# Patient Record
Sex: Female | Born: 1945 | Race: White | Hispanic: No | Marital: Married | Smoking: Former smoker
Health system: Southern US, Community
[De-identification: ages and names within clinical notes are randomized; demographics above are authoritative.]

## PROBLEM LIST (undated history)

## (undated) DIAGNOSIS — Z8719 Personal history of other diseases of the digestive system: Secondary | ICD-10-CM

## (undated) DIAGNOSIS — H35369 Drusen (degenerative) of macula, unspecified eye: Secondary | ICD-10-CM

## (undated) DIAGNOSIS — N183 Chronic kidney disease, stage 3 (moderate): Secondary | ICD-10-CM

## (undated) DIAGNOSIS — K8020 Calculus of gallbladder without cholecystitis without obstruction: Secondary | ICD-10-CM

## (undated) DIAGNOSIS — G459 Transient cerebral ischemic attack, unspecified: Secondary | ICD-10-CM

## (undated) DIAGNOSIS — S93324A Dislocation of tarsometatarsal joint of right foot, initial encounter: Secondary | ICD-10-CM

## (undated) DIAGNOSIS — H10409 Unspecified chronic conjunctivitis, unspecified eye: Secondary | ICD-10-CM

## (undated) DIAGNOSIS — Z794 Long term (current) use of insulin: Secondary | ICD-10-CM

## (undated) DIAGNOSIS — F32A Depression, unspecified: Secondary | ICD-10-CM

## (undated) DIAGNOSIS — M25572 Pain in left ankle and joints of left foot: Secondary | ICD-10-CM

## (undated) DIAGNOSIS — E119 Type 2 diabetes mellitus without complications: Secondary | ICD-10-CM

## (undated) DIAGNOSIS — E785 Hyperlipidemia, unspecified: Secondary | ICD-10-CM

## (undated) DIAGNOSIS — F329 Major depressive disorder, single episode, unspecified: Secondary | ICD-10-CM

## (undated) DIAGNOSIS — H2512 Age-related nuclear cataract, left eye: Secondary | ICD-10-CM

## (undated) DIAGNOSIS — E890 Postprocedural hypothyroidism: Secondary | ICD-10-CM

## (undated) DIAGNOSIS — K635 Polyp of colon: Secondary | ICD-10-CM

## (undated) DIAGNOSIS — R4189 Other symptoms and signs involving cognitive functions and awareness: Secondary | ICD-10-CM

## (undated) DIAGNOSIS — I639 Cerebral infarction, unspecified: Secondary | ICD-10-CM

## (undated) DIAGNOSIS — I2510 Atherosclerotic heart disease of native coronary artery without angina pectoris: Secondary | ICD-10-CM

## (undated) DIAGNOSIS — A0472 Enterocolitis due to Clostridium difficile, not specified as recurrent: Secondary | ICD-10-CM

## (undated) DIAGNOSIS — D509 Iron deficiency anemia, unspecified: Secondary | ICD-10-CM

## (undated) DIAGNOSIS — M5030 Other cervical disc degeneration, unspecified cervical region: Secondary | ICD-10-CM

## (undated) DIAGNOSIS — R0602 Shortness of breath: Secondary | ICD-10-CM

## (undated) DIAGNOSIS — E039 Hypothyroidism, unspecified: Secondary | ICD-10-CM

## (undated) DIAGNOSIS — H53462 Homonymous bilateral field defects, left side: Secondary | ICD-10-CM

## (undated) DIAGNOSIS — N3090 Cystitis, unspecified without hematuria: Secondary | ICD-10-CM

## (undated) DIAGNOSIS — G4733 Obstructive sleep apnea (adult) (pediatric): Secondary | ICD-10-CM

## (undated) DIAGNOSIS — I10 Essential (primary) hypertension: Secondary | ICD-10-CM

## (undated) DIAGNOSIS — R0683 Snoring: Secondary | ICD-10-CM

## (undated) DIAGNOSIS — R8271 Bacteriuria: Secondary | ICD-10-CM

## (undated) DIAGNOSIS — H43819 Vitreous degeneration, unspecified eye: Secondary | ICD-10-CM

## (undated) DIAGNOSIS — E1122 Type 2 diabetes mellitus with diabetic chronic kidney disease: Secondary | ICD-10-CM

## (undated) DIAGNOSIS — Z85528 Personal history of other malignant neoplasm of kidney: Secondary | ICD-10-CM

## (undated) DIAGNOSIS — E079 Disorder of thyroid, unspecified: Secondary | ICD-10-CM

## (undated) DIAGNOSIS — C649 Malignant neoplasm of unspecified kidney, except renal pelvis: Secondary | ICD-10-CM

## (undated) DIAGNOSIS — N189 Chronic kidney disease, unspecified: Secondary | ICD-10-CM

## (undated) DIAGNOSIS — Z9289 Personal history of other medical treatment: Secondary | ICD-10-CM

## (undated) DIAGNOSIS — I4891 Unspecified atrial fibrillation: Secondary | ICD-10-CM

## (undated) DIAGNOSIS — R413 Other amnesia: Secondary | ICD-10-CM

## (undated) DIAGNOSIS — R8281 Pyuria: Secondary | ICD-10-CM

## (undated) DIAGNOSIS — K5521 Angiodysplasia of colon with hemorrhage: Secondary | ICD-10-CM

## (undated) DIAGNOSIS — C642 Malignant neoplasm of left kidney, except renal pelvis: Secondary | ICD-10-CM

## (undated) DIAGNOSIS — M19079 Primary osteoarthritis, unspecified ankle and foot: Secondary | ICD-10-CM

## (undated) DIAGNOSIS — J449 Chronic obstructive pulmonary disease, unspecified: Secondary | ICD-10-CM

## (undated) DIAGNOSIS — K859 Acute pancreatitis without necrosis or infection, unspecified: Secondary | ICD-10-CM

## (undated) DIAGNOSIS — D649 Anemia, unspecified: Secondary | ICD-10-CM

## (undated) HISTORY — DX: Shortness of breath: R06.02

## (undated) HISTORY — DX: Drusen (degenerative) of macula, unspecified eye: H35.369

## (undated) HISTORY — DX: Unspecified atrial fibrillation: I48.91

## (undated) HISTORY — DX: Dislocation of tarsometatarsal joint of right foot, initial encounter: S93.324A

## (undated) HISTORY — DX: Snoring: R06.83

## (undated) HISTORY — DX: Malignant neoplasm of unspecified kidney, except renal pelvis: C64.9

## (undated) HISTORY — DX: Malignant neoplasm of left kidney, except renal pelvis: C64.2

## (undated) HISTORY — DX: Pain in left ankle and joints of left foot: M25.572

## (undated) HISTORY — DX: Essential (primary) hypertension: I10

## (undated) HISTORY — DX: Type 2 diabetes mellitus with diabetic chronic kidney disease: Z79.4

## (undated) HISTORY — DX: Pyuria: R82.81

## (undated) HISTORY — DX: Personal history of other malignant neoplasm of kidney: Z85.528

## (undated) HISTORY — DX: Personal history of other diseases of the digestive system: Z87.19

## (undated) HISTORY — DX: Hyperlipidemia, unspecified: E78.5

## (undated) HISTORY — DX: Primary osteoarthritis, unspecified ankle and foot: M19.079

## (undated) HISTORY — DX: Chronic kidney disease, unspecified: N18.9

## (undated) HISTORY — DX: Homonymous bilateral field defects, left side: H53.462

## (undated) HISTORY — DX: Transient cerebral ischemic attack, unspecified: G45.9

## (undated) HISTORY — DX: Depression, unspecified: F32.A

## (undated) HISTORY — DX: Calculus of gallbladder without cholecystitis without obstruction: K80.20

## (undated) HISTORY — DX: Anemia, unspecified: D64.9

## (undated) HISTORY — DX: Cystitis, unspecified without hematuria: N30.90

## (undated) HISTORY — DX: Unspecified chronic conjunctivitis, unspecified eye: H10.409

## (undated) HISTORY — DX: Postprocedural hypothyroidism: E89.0

## (undated) HISTORY — DX: Vitreous degeneration, unspecified eye: H43.819

## (undated) HISTORY — DX: Polyp of colon: K63.5

## (undated) HISTORY — DX: Acute pancreatitis without necrosis or infection, unspecified: K85.90

## (undated) HISTORY — DX: Hypothyroidism, unspecified: E03.9

## (undated) HISTORY — DX: Iron deficiency anemia, unspecified: D50.9

## (undated) HISTORY — DX: Personal history of other medical treatment: Z92.89

## (undated) HISTORY — DX: Enterocolitis due to Clostridium difficile, not specified as recurrent: A04.72

## (undated) HISTORY — DX: Chronic kidney disease, stage 3 (moderate): N18.3

## (undated) HISTORY — DX: Cerebral infarction, unspecified: I63.9

## (undated) HISTORY — DX: Disorder of thyroid, unspecified: E07.9

## (undated) HISTORY — DX: Bacteriuria: R82.71

## (undated) HISTORY — DX: Angiodysplasia of colon with hemorrhage: K55.21

## (undated) HISTORY — DX: Other cervical disc degeneration, unspecified cervical region: M50.30

## (undated) HISTORY — DX: Type 2 diabetes mellitus without complications: E11.9

## (undated) HISTORY — DX: Major depressive disorder, single episode, unspecified: F32.9

## (undated) HISTORY — DX: Other amnesia: R41.3

## (undated) HISTORY — DX: Age-related nuclear cataract, left eye: H25.12

## (undated) HISTORY — DX: Obstructive sleep apnea (adult) (pediatric): G47.33

## (undated) HISTORY — DX: Other symptoms and signs involving cognitive functions and awareness: R41.89

## (undated) HISTORY — DX: Atherosclerotic heart disease of native coronary artery without angina pectoris: I25.10

## (undated) HISTORY — PX: CHOLECYSTECTOMY: SHX55

## (undated) HISTORY — PX: CATARACT EXTRACTION: SUR2

## (undated) HISTORY — DX: Long term (current) use of insulin: E11.22

---

## 1976-08-09 HISTORY — PX: THYROIDECTOMY, PARTIAL: SHX18

## 1978-08-09 HISTORY — PX: TUBAL LIGATION: SHX77

## 1982-08-09 HISTORY — PX: TOTAL ABDOMINAL HYSTERECTOMY W/ BILATERAL SALPINGOOPHORECTOMY: SHX83

## 1993-08-09 DIAGNOSIS — I10 Essential (primary) hypertension: Secondary | ICD-10-CM

## 1993-08-09 HISTORY — DX: Essential (primary) hypertension: I10

## 1997-08-09 DIAGNOSIS — E119 Type 2 diabetes mellitus without complications: Secondary | ICD-10-CM

## 1997-08-09 HISTORY — DX: Type 2 diabetes mellitus without complications: E11.9

## 2000-08-09 DIAGNOSIS — I2510 Atherosclerotic heart disease of native coronary artery without angina pectoris: Secondary | ICD-10-CM

## 2000-08-09 HISTORY — DX: Atherosclerotic heart disease of native coronary artery without angina pectoris: I25.10

## 2000-08-09 HISTORY — PX: CORONARY STENT PLACEMENT: SHX1402

## 2003-08-10 DIAGNOSIS — K635 Polyp of colon: Secondary | ICD-10-CM

## 2003-08-10 DIAGNOSIS — C642 Malignant neoplasm of left kidney, except renal pelvis: Secondary | ICD-10-CM

## 2003-08-10 DIAGNOSIS — R0683 Snoring: Secondary | ICD-10-CM

## 2003-08-10 HISTORY — DX: Snoring: R06.83

## 2003-08-10 HISTORY — DX: Polyp of colon: K63.5

## 2003-08-10 HISTORY — PX: NEPHRECTOMY: SHX65

## 2003-08-10 HISTORY — DX: Malignant neoplasm of left kidney, except renal pelvis: C64.2

## 2006-08-09 DIAGNOSIS — K859 Acute pancreatitis without necrosis or infection, unspecified: Secondary | ICD-10-CM

## 2006-08-09 DIAGNOSIS — K8020 Calculus of gallbladder without cholecystitis without obstruction: Secondary | ICD-10-CM

## 2006-08-09 HISTORY — DX: Calculus of gallbladder without cholecystitis without obstruction: K80.20

## 2006-08-09 HISTORY — PX: GALLBLADDER SURGERY: SHX652

## 2006-08-09 HISTORY — DX: Acute pancreatitis without necrosis or infection, unspecified: K85.90

## 2009-08-09 DIAGNOSIS — I639 Cerebral infarction, unspecified: Secondary | ICD-10-CM

## 2009-08-09 HISTORY — DX: Cerebral infarction, unspecified: I63.9

## 2011-07-13 DIAGNOSIS — Z905 Acquired absence of kidney: Secondary | ICD-10-CM | POA: Insufficient documentation

## 2011-07-13 DIAGNOSIS — Z8051 Family history of malignant neoplasm of kidney: Secondary | ICD-10-CM | POA: Insufficient documentation

## 2011-07-20 DIAGNOSIS — I10 Essential (primary) hypertension: Secondary | ICD-10-CM | POA: Insufficient documentation

## 2012-07-04 DIAGNOSIS — H2510 Age-related nuclear cataract, unspecified eye: Secondary | ICD-10-CM | POA: Insufficient documentation

## 2012-07-04 DIAGNOSIS — H10409 Unspecified chronic conjunctivitis, unspecified eye: Secondary | ICD-10-CM

## 2012-07-04 DIAGNOSIS — H35369 Drusen (degenerative) of macula, unspecified eye: Secondary | ICD-10-CM | POA: Insufficient documentation

## 2012-07-04 DIAGNOSIS — H53462 Homonymous bilateral field defects, left side: Secondary | ICD-10-CM

## 2012-07-04 DIAGNOSIS — H43819 Vitreous degeneration, unspecified eye: Secondary | ICD-10-CM | POA: Insufficient documentation

## 2012-07-04 DIAGNOSIS — H269 Unspecified cataract: Secondary | ICD-10-CM | POA: Insufficient documentation

## 2012-07-04 HISTORY — DX: Vitreous degeneration, unspecified eye: H43.819

## 2012-07-04 HISTORY — DX: Homonymous bilateral field defects, left side: H53.462

## 2012-07-04 HISTORY — DX: Unspecified chronic conjunctivitis, unspecified eye: H10.409

## 2012-07-04 HISTORY — DX: Drusen (degenerative) of macula, unspecified eye: H35.369

## 2014-01-20 DIAGNOSIS — Q2112 Patent foramen ovale: Secondary | ICD-10-CM | POA: Insufficient documentation

## 2014-01-20 DIAGNOSIS — Q211 Atrial septal defect: Secondary | ICD-10-CM | POA: Insufficient documentation

## 2014-05-28 DIAGNOSIS — M47812 Spondylosis without myelopathy or radiculopathy, cervical region: Secondary | ICD-10-CM | POA: Insufficient documentation

## 2014-05-28 DIAGNOSIS — K219 Gastro-esophageal reflux disease without esophagitis: Secondary | ICD-10-CM | POA: Insufficient documentation

## 2014-05-28 DIAGNOSIS — M5417 Radiculopathy, lumbosacral region: Secondary | ICD-10-CM

## 2014-05-28 DIAGNOSIS — G4733 Obstructive sleep apnea (adult) (pediatric): Secondary | ICD-10-CM | POA: Insufficient documentation

## 2014-05-28 DIAGNOSIS — M5030 Other cervical disc degeneration, unspecified cervical region: Secondary | ICD-10-CM | POA: Insufficient documentation

## 2014-05-28 DIAGNOSIS — M810 Age-related osteoporosis without current pathological fracture: Secondary | ICD-10-CM | POA: Insufficient documentation

## 2014-05-28 DIAGNOSIS — N3000 Acute cystitis without hematuria: Secondary | ICD-10-CM | POA: Insufficient documentation

## 2014-05-28 DIAGNOSIS — IMO0002 Reserved for concepts with insufficient information to code with codable children: Secondary | ICD-10-CM | POA: Insufficient documentation

## 2014-05-28 DIAGNOSIS — N3010 Interstitial cystitis (chronic) without hematuria: Secondary | ICD-10-CM | POA: Insufficient documentation

## 2014-05-28 DIAGNOSIS — N35919 Unspecified urethral stricture, male, unspecified site: Secondary | ICD-10-CM | POA: Insufficient documentation

## 2014-05-28 DIAGNOSIS — M545 Low back pain, unspecified: Secondary | ICD-10-CM | POA: Insufficient documentation

## 2014-05-28 DIAGNOSIS — S39012A Strain of muscle, fascia and tendon of lower back, initial encounter: Secondary | ICD-10-CM | POA: Insufficient documentation

## 2014-05-28 DIAGNOSIS — R4182 Altered mental status, unspecified: Secondary | ICD-10-CM | POA: Insufficient documentation

## 2014-05-28 DIAGNOSIS — M51379 Other intervertebral disc degeneration, lumbosacral region without mention of lumbar back pain or lower extremity pain: Secondary | ICD-10-CM | POA: Insufficient documentation

## 2014-05-28 DIAGNOSIS — E162 Hypoglycemia, unspecified: Secondary | ICD-10-CM | POA: Insufficient documentation

## 2014-05-28 DIAGNOSIS — R2689 Other abnormalities of gait and mobility: Secondary | ICD-10-CM | POA: Insufficient documentation

## 2014-05-28 DIAGNOSIS — J449 Chronic obstructive pulmonary disease, unspecified: Secondary | ICD-10-CM | POA: Insufficient documentation

## 2014-05-28 DIAGNOSIS — M5137 Other intervertebral disc degeneration, lumbosacral region: Secondary | ICD-10-CM | POA: Insufficient documentation

## 2014-05-28 DIAGNOSIS — R350 Frequency of micturition: Secondary | ICD-10-CM | POA: Insufficient documentation

## 2014-05-28 DIAGNOSIS — F17200 Nicotine dependence, unspecified, uncomplicated: Secondary | ICD-10-CM | POA: Insufficient documentation

## 2014-05-28 DIAGNOSIS — K861 Other chronic pancreatitis: Secondary | ICD-10-CM | POA: Insufficient documentation

## 2014-05-28 DIAGNOSIS — L03012 Cellulitis of left finger: Secondary | ICD-10-CM | POA: Insufficient documentation

## 2014-05-28 DIAGNOSIS — Z87891 Personal history of nicotine dependence: Secondary | ICD-10-CM | POA: Insufficient documentation

## 2014-05-28 DIAGNOSIS — M5414 Radiculopathy, thoracic region: Secondary | ICD-10-CM | POA: Insufficient documentation

## 2014-05-28 DIAGNOSIS — R251 Tremor, unspecified: Secondary | ICD-10-CM | POA: Insufficient documentation

## 2014-05-28 DIAGNOSIS — M79609 Pain in unspecified limb: Secondary | ICD-10-CM | POA: Insufficient documentation

## 2014-05-28 DIAGNOSIS — R609 Edema, unspecified: Secondary | ICD-10-CM | POA: Insufficient documentation

## 2014-05-28 DIAGNOSIS — R110 Nausea: Secondary | ICD-10-CM | POA: Insufficient documentation

## 2014-05-28 DIAGNOSIS — M7989 Other specified soft tissue disorders: Secondary | ICD-10-CM | POA: Insufficient documentation

## 2014-05-28 DIAGNOSIS — R809 Proteinuria, unspecified: Secondary | ICD-10-CM | POA: Insufficient documentation

## 2014-05-28 DIAGNOSIS — R3915 Urgency of urination: Secondary | ICD-10-CM | POA: Insufficient documentation

## 2014-05-28 DIAGNOSIS — F331 Major depressive disorder, recurrent, moderate: Secondary | ICD-10-CM | POA: Insufficient documentation

## 2014-05-28 DIAGNOSIS — I6990 Unspecified sequelae of unspecified cerebrovascular disease: Secondary | ICD-10-CM | POA: Insufficient documentation

## 2014-05-28 DIAGNOSIS — R0989 Other specified symptoms and signs involving the circulatory and respiratory systems: Secondary | ICD-10-CM | POA: Insufficient documentation

## 2015-05-20 DIAGNOSIS — G40209 Localization-related (focal) (partial) symptomatic epilepsy and epileptic syndromes with complex partial seizures, not intractable, without status epilepticus: Secondary | ICD-10-CM | POA: Insufficient documentation

## 2015-08-10 DIAGNOSIS — A0472 Enterocolitis due to Clostridium difficile, not specified as recurrent: Secondary | ICD-10-CM

## 2015-08-10 HISTORY — DX: Enterocolitis due to Clostridium difficile, not specified as recurrent: A04.72

## 2015-08-14 DIAGNOSIS — R8271 Bacteriuria: Secondary | ICD-10-CM

## 2015-08-14 HISTORY — DX: Bacteriuria: R82.71

## 2015-08-19 DIAGNOSIS — Z95818 Presence of other cardiac implants and grafts: Secondary | ICD-10-CM | POA: Insufficient documentation

## 2015-09-30 DIAGNOSIS — B373 Candidiasis of vulva and vagina: Secondary | ICD-10-CM | POA: Insufficient documentation

## 2015-09-30 DIAGNOSIS — L304 Erythema intertrigo: Secondary | ICD-10-CM | POA: Insufficient documentation

## 2015-09-30 DIAGNOSIS — E663 Overweight: Secondary | ICD-10-CM | POA: Insufficient documentation

## 2015-09-30 DIAGNOSIS — B3731 Acute candidiasis of vulva and vagina: Secondary | ICD-10-CM | POA: Insufficient documentation

## 2016-02-24 DIAGNOSIS — F419 Anxiety disorder, unspecified: Secondary | ICD-10-CM

## 2016-02-24 DIAGNOSIS — F329 Major depressive disorder, single episode, unspecified: Secondary | ICD-10-CM | POA: Insufficient documentation

## 2016-04-30 DIAGNOSIS — M19079 Primary osteoarthritis, unspecified ankle and foot: Secondary | ICD-10-CM

## 2016-04-30 DIAGNOSIS — M19072 Primary osteoarthritis, left ankle and foot: Secondary | ICD-10-CM | POA: Insufficient documentation

## 2016-04-30 HISTORY — DX: Primary osteoarthritis, unspecified ankle and foot: M19.079

## 2016-07-08 DIAGNOSIS — Z85528 Personal history of other malignant neoplasm of kidney: Secondary | ICD-10-CM | POA: Insufficient documentation

## 2016-08-09 DIAGNOSIS — K5521 Angiodysplasia of colon with hemorrhage: Secondary | ICD-10-CM

## 2016-08-09 HISTORY — DX: Angiodysplasia of colon with hemorrhage: K55.21

## 2016-08-09 HISTORY — PX: LOOP RECORDER IMPLANT: SHX5954

## 2016-09-09 DIAGNOSIS — I4891 Unspecified atrial fibrillation: Secondary | ICD-10-CM

## 2016-09-09 DIAGNOSIS — R413 Other amnesia: Secondary | ICD-10-CM | POA: Insufficient documentation

## 2016-09-09 HISTORY — DX: Unspecified atrial fibrillation: I48.91

## 2016-09-09 HISTORY — DX: Other amnesia: R41.3

## 2016-09-15 DIAGNOSIS — M7750 Other enthesopathy of unspecified foot: Secondary | ICD-10-CM | POA: Insufficient documentation

## 2016-09-15 DIAGNOSIS — M25572 Pain in left ankle and joints of left foot: Secondary | ICD-10-CM

## 2016-09-15 HISTORY — DX: Pain in left ankle and joints of left foot: M25.572

## 2016-09-19 DIAGNOSIS — N179 Acute kidney failure, unspecified: Secondary | ICD-10-CM | POA: Insufficient documentation

## 2016-09-19 DIAGNOSIS — Z720 Tobacco use: Secondary | ICD-10-CM | POA: Insufficient documentation

## 2016-09-19 DIAGNOSIS — K625 Hemorrhage of anus and rectum: Secondary | ICD-10-CM | POA: Insufficient documentation

## 2016-09-19 DIAGNOSIS — N189 Chronic kidney disease, unspecified: Secondary | ICD-10-CM

## 2016-09-22 HISTORY — PX: COLONOSCOPY: SHX174

## 2016-09-22 HISTORY — PX: UPPER GASTROINTESTINAL ENDOSCOPY: SHX188

## 2016-09-27 DIAGNOSIS — D509 Iron deficiency anemia, unspecified: Secondary | ICD-10-CM

## 2016-09-27 HISTORY — DX: Iron deficiency anemia, unspecified: D50.9

## 2016-10-04 DIAGNOSIS — N179 Acute kidney failure, unspecified: Secondary | ICD-10-CM | POA: Insufficient documentation

## 2016-10-04 DIAGNOSIS — N189 Chronic kidney disease, unspecified: Secondary | ICD-10-CM

## 2016-10-07 DIAGNOSIS — Z9289 Personal history of other medical treatment: Secondary | ICD-10-CM

## 2016-10-07 HISTORY — PX: COLONOSCOPY W/ BIOPSIES: SHX1374

## 2016-10-07 HISTORY — DX: Personal history of other medical treatment: Z92.89

## 2016-10-09 DIAGNOSIS — K5730 Diverticulosis of large intestine without perforation or abscess without bleeding: Secondary | ICD-10-CM | POA: Insufficient documentation

## 2016-10-21 DIAGNOSIS — G459 Transient cerebral ischemic attack, unspecified: Secondary | ICD-10-CM

## 2016-10-21 HISTORY — DX: Transient cerebral ischemic attack, unspecified: G45.9

## 2016-10-22 DIAGNOSIS — D649 Anemia, unspecified: Secondary | ICD-10-CM | POA: Insufficient documentation

## 2016-10-22 DIAGNOSIS — G451 Carotid artery syndrome (hemispheric): Secondary | ICD-10-CM | POA: Insufficient documentation

## 2016-10-22 DIAGNOSIS — E1165 Type 2 diabetes mellitus with hyperglycemia: Secondary | ICD-10-CM | POA: Insufficient documentation

## 2016-11-16 HISTORY — PX: COLONOSCOPY: SHX174

## 2016-12-09 DIAGNOSIS — H2512 Age-related nuclear cataract, left eye: Secondary | ICD-10-CM

## 2016-12-09 HISTORY — DX: Age-related nuclear cataract, left eye: H25.12

## 2017-01-19 DIAGNOSIS — Z9842 Cataract extraction status, left eye: Secondary | ICD-10-CM | POA: Insufficient documentation

## 2017-01-19 HISTORY — PX: CATARACT EXTRACTION W/ INTRAOCULAR LENS IMPLANT: SHX1309

## 2017-01-28 DIAGNOSIS — S93401A Sprain of unspecified ligament of right ankle, initial encounter: Secondary | ICD-10-CM | POA: Insufficient documentation

## 2017-08-14 DIAGNOSIS — S93324A Dislocation of tarsometatarsal joint of right foot, initial encounter: Secondary | ICD-10-CM | POA: Insufficient documentation

## 2017-08-14 HISTORY — DX: Dislocation of tarsometatarsal joint of right foot, initial encounter: S93.324A

## 2017-09-01 ENCOUNTER — Inpatient Hospital Stay (HOSPITAL_COMMUNITY): Payer: Medicare Other

## 2017-09-01 ENCOUNTER — Inpatient Hospital Stay (HOSPITAL_COMMUNITY)
Admission: EM | Admit: 2017-09-01 | Discharge: 2017-09-05 | DRG: 061 | Disposition: A | Discharge status: Skilled Nursing Facility | Payer: Medicare Other | Attending: Neurology | Admitting: Neurology

## 2017-09-01 ENCOUNTER — Encounter (HOSPITAL_COMMUNITY): Payer: Self-pay

## 2017-09-01 ENCOUNTER — Emergency Department (HOSPITAL_COMMUNITY)
Admission: EM | Admit: 2017-09-01 | Discharge: 2017-09-01 | Disposition: A | Discharge status: Home / Self Care | Payer: Medicare Other | Attending: Student in an Organized Health Care Education/Training Program | Admitting: Student in an Organized Health Care Education/Training Program

## 2017-09-01 ENCOUNTER — Emergency Department (HOSPITAL_COMMUNITY): Payer: Medicare Other

## 2017-09-01 DIAGNOSIS — R29717 NIHSS score 17: Secondary | ICD-10-CM | POA: Diagnosis present

## 2017-09-01 DIAGNOSIS — I480 Paroxysmal atrial fibrillation: Secondary | ICD-10-CM

## 2017-09-01 DIAGNOSIS — F17210 Nicotine dependence, cigarettes, uncomplicated: Secondary | ICD-10-CM | POA: Diagnosis present

## 2017-09-01 DIAGNOSIS — Z87898 Personal history of other specified conditions: Secondary | ICD-10-CM

## 2017-09-01 DIAGNOSIS — Z515 Encounter for palliative care: Secondary | ICD-10-CM

## 2017-09-01 DIAGNOSIS — F01518 Vascular dementia, unspecified severity, with other behavioral disturbance: Secondary | ICD-10-CM

## 2017-09-01 DIAGNOSIS — E785 Hyperlipidemia, unspecified: Secondary | ICD-10-CM | POA: Diagnosis present

## 2017-09-01 DIAGNOSIS — J690 Pneumonitis due to inhalation of food and vomit: Secondary | ICD-10-CM | POA: Diagnosis present

## 2017-09-01 DIAGNOSIS — S92321D Displaced fracture of second metatarsal bone, right foot, subsequent encounter for fracture with routine healing: Secondary | ICD-10-CM

## 2017-09-01 DIAGNOSIS — D689 Coagulation defect, unspecified: Secondary | ICD-10-CM | POA: Diagnosis present

## 2017-09-01 DIAGNOSIS — Z8673 Personal history of transient ischemic attack (TIA), and cerebral infarction without residual deficits: Secondary | ICD-10-CM | POA: Diagnosis not present

## 2017-09-01 DIAGNOSIS — Z955 Presence of coronary angioplasty implant and graft: Secondary | ICD-10-CM

## 2017-09-01 DIAGNOSIS — Z7189 Other specified counseling: Secondary | ICD-10-CM | POA: Diagnosis not present

## 2017-09-01 DIAGNOSIS — R29810 Facial weakness: Secondary | ICD-10-CM | POA: Diagnosis present

## 2017-09-01 DIAGNOSIS — J449 Chronic obstructive pulmonary disease, unspecified: Secondary | ICD-10-CM | POA: Diagnosis present

## 2017-09-01 DIAGNOSIS — J9601 Acute respiratory failure with hypoxia: Secondary | ICD-10-CM | POA: Diagnosis present

## 2017-09-01 DIAGNOSIS — G4733 Obstructive sleep apnea (adult) (pediatric): Secondary | ICD-10-CM | POA: Diagnosis not present

## 2017-09-01 DIAGNOSIS — G8191 Hemiplegia, unspecified affecting right dominant side: Secondary | ICD-10-CM | POA: Diagnosis present

## 2017-09-01 DIAGNOSIS — W19XXXA Unspecified fall, initial encounter: Secondary | ICD-10-CM | POA: Diagnosis present

## 2017-09-01 DIAGNOSIS — I639 Cerebral infarction, unspecified: Secondary | ICD-10-CM | POA: Diagnosis present

## 2017-09-01 DIAGNOSIS — I63412 Cerebral infarction due to embolism of left middle cerebral artery: Secondary | ICD-10-CM | POA: Diagnosis present

## 2017-09-01 DIAGNOSIS — R739 Hyperglycemia, unspecified: Secondary | ICD-10-CM | POA: Diagnosis present

## 2017-09-01 DIAGNOSIS — R1310 Dysphagia, unspecified: Secondary | ICD-10-CM | POA: Diagnosis present

## 2017-09-01 DIAGNOSIS — Z79899 Other long term (current) drug therapy: Secondary | ICD-10-CM

## 2017-09-01 DIAGNOSIS — S42001D Fracture of unspecified part of right clavicle, subsequent encounter for fracture with routine healing: Secondary | ICD-10-CM

## 2017-09-01 DIAGNOSIS — F0281 Dementia in other diseases classified elsewhere with behavioral disturbance: Secondary | ICD-10-CM | POA: Diagnosis present

## 2017-09-01 DIAGNOSIS — I129 Hypertensive chronic kidney disease with stage 1 through stage 4 chronic kidney disease, or unspecified chronic kidney disease: Secondary | ICD-10-CM | POA: Diagnosis present

## 2017-09-01 DIAGNOSIS — Z905 Acquired absence of kidney: Secondary | ICD-10-CM

## 2017-09-01 DIAGNOSIS — G9340 Encephalopathy, unspecified: Secondary | ICD-10-CM

## 2017-09-01 DIAGNOSIS — Z8719 Personal history of other diseases of the digestive system: Secondary | ICD-10-CM | POA: Diagnosis not present

## 2017-09-01 DIAGNOSIS — N184 Chronic kidney disease, stage 4 (severe): Secondary | ICD-10-CM | POA: Diagnosis present

## 2017-09-01 DIAGNOSIS — S92331D Displaced fracture of third metatarsal bone, right foot, subsequent encounter for fracture with routine healing: Secondary | ICD-10-CM | POA: Diagnosis not present

## 2017-09-01 DIAGNOSIS — S42001A Fracture of unspecified part of right clavicle, initial encounter for closed fracture: Secondary | ICD-10-CM | POA: Diagnosis present

## 2017-09-01 DIAGNOSIS — R471 Dysarthria and anarthria: Secondary | ICD-10-CM | POA: Diagnosis present

## 2017-09-01 DIAGNOSIS — I2510 Atherosclerotic heart disease of native coronary artery without angina pectoris: Secondary | ICD-10-CM | POA: Diagnosis present

## 2017-09-01 DIAGNOSIS — J9691 Respiratory failure, unspecified with hypoxia: Secondary | ICD-10-CM | POA: Diagnosis not present

## 2017-09-01 DIAGNOSIS — F0151 Vascular dementia with behavioral disturbance: Secondary | ICD-10-CM | POA: Diagnosis present

## 2017-09-01 DIAGNOSIS — Z85528 Personal history of other malignant neoplasm of kidney: Secondary | ICD-10-CM | POA: Diagnosis not present

## 2017-09-01 DIAGNOSIS — S92341D Displaced fracture of fourth metatarsal bone, right foot, subsequent encounter for fracture with routine healing: Secondary | ICD-10-CM

## 2017-09-01 DIAGNOSIS — J9690 Respiratory failure, unspecified, unspecified whether with hypoxia or hypercapnia: Secondary | ICD-10-CM

## 2017-09-01 DIAGNOSIS — D509 Iron deficiency anemia, unspecified: Secondary | ICD-10-CM | POA: Diagnosis present

## 2017-09-01 DIAGNOSIS — Z66 Do not resuscitate: Secondary | ICD-10-CM | POA: Diagnosis present

## 2017-09-01 DIAGNOSIS — Z888 Allergy status to other drugs, medicaments and biological substances status: Secondary | ICD-10-CM

## 2017-09-01 DIAGNOSIS — R296 Repeated falls: Secondary | ICD-10-CM | POA: Diagnosis present

## 2017-09-01 DIAGNOSIS — Z7902 Long term (current) use of antithrombotics/antiplatelets: Secondary | ICD-10-CM

## 2017-09-01 DIAGNOSIS — J432 Centrilobular emphysema: Secondary | ICD-10-CM | POA: Diagnosis not present

## 2017-09-01 DIAGNOSIS — S82899A Other fracture of unspecified lower leg, initial encounter for closed fracture: Secondary | ICD-10-CM

## 2017-09-01 DIAGNOSIS — R4701 Aphasia: Secondary | ICD-10-CM | POA: Diagnosis present

## 2017-09-01 DIAGNOSIS — I6340 Cerebral infarction due to embolism of unspecified cerebral artery: Secondary | ICD-10-CM | POA: Diagnosis not present

## 2017-09-01 DIAGNOSIS — R451 Restlessness and agitation: Secondary | ICD-10-CM | POA: Diagnosis not present

## 2017-09-01 DIAGNOSIS — I63442 Cerebral infarction due to embolism of left cerebellar artery: Secondary | ICD-10-CM | POA: Diagnosis not present

## 2017-09-01 HISTORY — DX: Cerebral infarction, unspecified: I63.9

## 2017-09-01 HISTORY — DX: Chronic obstructive pulmonary disease, unspecified: J44.9

## 2017-09-01 LAB — URINALYSIS, ROUTINE W REFLEX MICROSCOPIC
Bacteria, UA: NONE SEEN
Bilirubin Urine: NEGATIVE
GLUCOSE, UA: NEGATIVE mg/dL
Ketones, ur: NEGATIVE mg/dL
Leukocytes, UA: NEGATIVE
NITRITE: NEGATIVE
PROTEIN: NEGATIVE mg/dL
Specific Gravity, Urine: 1.02 (ref 1.005–1.030)
Squamous Epithelial / LPF: NONE SEEN
pH: 5 (ref 5.0–8.0)

## 2017-09-01 LAB — I-STAT CHEM 8, ED
BUN: 60 mg/dL — ABNORMAL HIGH (ref 6–20)
BUN: 67 mg/dL — ABNORMAL HIGH (ref 6–20)
CALCIUM ION: 0.84 mmol/L — AB (ref 1.15–1.40)
CHLORIDE: 106 mmol/L (ref 101–111)
CHLORIDE: 110 mmol/L (ref 101–111)
CREATININE: 2.4 mg/dL — AB (ref 0.44–1.00)
Calcium, Ion: 0.85 mmol/L — CL (ref 1.15–1.40)
Creatinine, Ser: 2.2 mg/dL — ABNORMAL HIGH (ref 0.44–1.00)
GLUCOSE: 121 mg/dL — AB (ref 65–99)
GLUCOSE: 128 mg/dL — AB (ref 65–99)
HCT: 33 % — ABNORMAL LOW (ref 36.0–46.0)
HCT: 39 % (ref 36.0–46.0)
Hemoglobin: 11.2 g/dL — ABNORMAL LOW (ref 12.0–15.0)
Hemoglobin: 13.3 g/dL (ref 12.0–15.0)
POTASSIUM: 4 mmol/L (ref 3.5–5.1)
Potassium: 3.7 mmol/L (ref 3.5–5.1)
SODIUM: 138 mmol/L (ref 135–145)
Sodium: 141 mmol/L (ref 135–145)
TCO2: 18 mmol/L — AB (ref 22–32)
TCO2: 19 mmol/L — ABNORMAL LOW (ref 22–32)

## 2017-09-01 LAB — GLUCOSE, CAPILLARY: Glucose-Capillary: 116 mg/dL — ABNORMAL HIGH (ref 65–99)

## 2017-09-01 LAB — ETHANOL: Alcohol, Ethyl (B): <10 mg/dL (ref <10)

## 2017-09-01 LAB — DIFFERENTIAL
Basophils Absolute: 0 10*3/uL (ref 0.0–0.1)
Basophils Relative: 0 %
Eosinophils Absolute: 0.1 10*3/uL (ref 0.0–0.7)
Eosinophils Relative: 1 %
LYMPHS PCT: 7 %
Lymphs Abs: 0.8 10*3/uL (ref 0.7–4.0)
MONO ABS: 0.8 10*3/uL (ref 0.1–1.0)
MONOS PCT: 7 %
NEUTROS ABS: 8.8 10*3/uL — AB (ref 1.7–7.7)
Neutrophils Relative %: 85 %

## 2017-09-01 LAB — PROTIME-INR
INR: 1.29
Prothrombin Time: 16 seconds — ABNORMAL HIGH (ref 11.4–15.2)

## 2017-09-01 LAB — COMPREHENSIVE METABOLIC PANEL
ALK PHOS: 85 U/L (ref 38–126)
ALT: 10 U/L — AB (ref 14–54)
AST: 15 U/L (ref 15–41)
Albumin: 2.5 g/dL — ABNORMAL LOW (ref 3.5–5.0)
Anion gap: 11 (ref 5–15)
BUN: 71 mg/dL — ABNORMAL HIGH (ref 6–20)
CALCIUM: 6 mg/dL — AB (ref 8.9–10.3)
CO2: 16 mmol/L — ABNORMAL LOW (ref 22–32)
CREATININE: 2.21 mg/dL — AB (ref 0.44–1.00)
Chloride: 108 mmol/L (ref 101–111)
GFR, EST AFRICAN AMERICAN: 25 mL/min — AB (ref >60)
GFR, EST NON AFRICAN AMERICAN: 21 mL/min — AB (ref >60)
Glucose, Bld: 126 mg/dL — ABNORMAL HIGH (ref 65–99)
Potassium: 3.7 mmol/L (ref 3.5–5.1)
Sodium: 135 mmol/L (ref 135–145)
Total Bilirubin: 0.5 mg/dL (ref 0.3–1.2)
Total Protein: 4.9 g/dL — ABNORMAL LOW (ref 6.5–8.1)

## 2017-09-01 LAB — APTT: aPTT: 28 seconds (ref 24–36)

## 2017-09-01 LAB — RAPID URINE DRUG SCREEN, HOSP PERFORMED
AMPHETAMINES: NOT DETECTED
Barbiturates: NOT DETECTED
Benzodiazepines: NOT DETECTED
Cocaine: NOT DETECTED
Opiates: NOT DETECTED
Tetrahydrocannabinol: NOT DETECTED

## 2017-09-01 LAB — I-STAT TROPONIN, ED
TROPONIN I, POC: 0.01 ng/mL (ref 0.00–0.08)
Troponin i, poc: 0.01 ng/mL (ref 0.00–0.08)

## 2017-09-01 LAB — CBC
HEMATOCRIT: 33.7 % — AB (ref 36.0–46.0)
Hemoglobin: 10.6 g/dL — ABNORMAL LOW (ref 12.0–15.0)
MCH: 25.4 pg — ABNORMAL LOW (ref 26.0–34.0)
MCHC: 31.5 g/dL (ref 30.0–36.0)
MCV: 80.6 fL (ref 78.0–100.0)
Platelets: 211 10*3/uL (ref 150–400)
RBC: 4.18 MIL/uL (ref 3.87–5.11)
RDW: 16.5 % — ABNORMAL HIGH (ref 11.5–15.5)
WBC: 10.4 10*3/uL (ref 4.0–10.5)

## 2017-09-01 LAB — CBG MONITORING, ED: Glucose-Capillary: 123 mg/dL — ABNORMAL HIGH (ref 65–99)

## 2017-09-01 MED ORDER — ALTEPLASE (STROKE) FULL DOSE INFUSION
0.9000 mg/kg | Freq: Once | INTRAVENOUS | Status: AC
  Administered 2017-09-01: 63 mg via INTRAVENOUS
  Filled 2017-09-01: qty 100

## 2017-09-01 MED ORDER — NICARDIPINE HCL IN NACL 20-0.86 MG/200ML-% IV SOLN
3.0000 mg/h | INTRAVENOUS | Status: DC
  Administered 2017-09-01 (×2): 5 mg/h via INTRAVENOUS
  Filled 2017-09-01 (×4): qty 200

## 2017-09-01 MED ORDER — ACETAMINOPHEN 325 MG PO TABS
650.0000 mg | ORAL_TABLET | ORAL | Status: DC | PRN
Start: 2017-09-01 — End: 2017-09-01

## 2017-09-01 MED ORDER — IOPAMIDOL (ISOVUE-370) INJECTION 76%
100.0000 mL | Freq: Once | INTRAVENOUS | Status: AC | PRN
  Administered 2017-09-01: 100 mL via INTRAVENOUS

## 2017-09-01 MED ORDER — DIPHENHYDRAMINE HCL 50 MG/ML IJ SOLN
12.5000 mg | Freq: Once | INTRAMUSCULAR | Status: AC
  Administered 2017-09-01: 12.5 mg via INTRAVENOUS
  Filled 2017-09-01: qty 1

## 2017-09-01 MED ORDER — LORAZEPAM 2 MG/ML IJ SOLN
0.5000 mg | Freq: Once | INTRAMUSCULAR | Status: AC
  Administered 2017-09-01: 0.5 mg via INTRAVENOUS
  Filled 2017-09-01: qty 1

## 2017-09-01 MED ORDER — FENTANYL CITRATE (PF) 100 MCG/2ML IJ SOLN
12.5000 ug | INTRAMUSCULAR | Status: DC | PRN
  Administered 2017-09-01 – 2017-09-02 (×3): 12.5 ug via INTRAVENOUS
  Filled 2017-09-01 (×3): qty 2

## 2017-09-01 MED ORDER — PANTOPRAZOLE SODIUM 40 MG IV SOLR: 40.0000 mg | Freq: Every day | INTRAVENOUS | Status: DC

## 2017-09-01 MED ORDER — SODIUM CHLORIDE 0.9 % IV SOLN
8.0000 mg | Freq: Once | INTRAVENOUS | Status: DC
  Filled 2017-09-01: qty 4

## 2017-09-01 MED ORDER — ALTEPLASE (STROKE) FULL DOSE INFUSION
0.9000 mg/kg | Freq: Once | INTRAVENOUS | Status: DC
  Filled 2017-09-01: qty 100

## 2017-09-01 MED ORDER — ONDANSETRON HCL 4 MG/2ML IJ SOLN
4.0000 mg | Freq: Once | INTRAMUSCULAR | Status: AC
  Administered 2017-09-01: 4 mg via INTRAVENOUS
  Filled 2017-09-01: qty 2

## 2017-09-01 MED ORDER — SODIUM CHLORIDE 0.9 % IV SOLN
INTRAVENOUS | Status: DC
  Administered 2017-09-01: 15:00:00 via INTRAVENOUS

## 2017-09-01 MED ORDER — ACETAMINOPHEN 650 MG RE SUPP: 650.0000 mg | RECTAL | Status: DC | PRN

## 2017-09-01 MED ORDER — ATORVASTATIN CALCIUM 10 MG PO TABS: 10.0000 mg | ORAL_TABLET | Freq: Every day | ORAL | Status: DC

## 2017-09-01 MED ORDER — INSULIN ASPART 100 UNIT/ML ~~LOC~~ SOLN
0.0000 [IU] | Freq: Three times a day (TID) | SUBCUTANEOUS | Status: DC
  Administered 2017-09-03 – 2017-09-05 (×3): 2 [IU] via SUBCUTANEOUS

## 2017-09-01 MED ORDER — PANTOPRAZOLE SODIUM 40 MG IV SOLR
40.0000 mg | Freq: Every day | INTRAVENOUS | Status: DC
  Administered 2017-09-01 – 2017-09-02 (×2): 40 mg via INTRAVENOUS
  Filled 2017-09-01 (×2): qty 40

## 2017-09-01 MED ORDER — STROKE: EARLY STAGES OF RECOVERY BOOK
Freq: Once | Status: AC
  Administered 2017-09-01: 22:00:00
  Filled 2017-09-01: qty 1

## 2017-09-01 MED ORDER — MICONAZOLE NITRATE 2 % EX CREA
TOPICAL_CREAM | Freq: Two times a day (BID) | CUTANEOUS | Status: DC
  Administered 2017-09-01: 1 via TOPICAL
  Administered 2017-09-02 – 2017-09-05 (×6): via TOPICAL
  Filled 2017-09-01: qty 28
  Filled 2017-09-01 (×2): qty 14

## 2017-09-01 MED ORDER — ACETAMINOPHEN 160 MG/5ML PO SOLN: 650.0000 mg | ORAL | Status: DC | PRN

## 2017-09-01 NOTE — ED Notes (Signed)
3cm x 3.5cm hematoma to right side of forehead marked with skin marker

## 2017-09-01 NOTE — Code Documentation (Signed)
72yo female arriving to [facility] at [address] via [place].  Patient from home with right sided deficits and aphasia, code stroke LVO+ activated by EMS.  Initial report from EMS that patient was LKW yesterday at 2130, LVO score of 5 per EMS.  Of note, patient with fall on Saturday with right shoulder/chest bruising and right leg splinted.  Stroke team at the bedside on patient arrival.  Labs drawn and patient cleared for CT by Dr. Dayna Barker. Patient to CT with team. CT completed followed CTP and CTA.  NIHSS 16, see documentation for details and code stroke times. Patient with severe aphasia, unable to follow commands, right arm flaccid and right neglect.  Patient back to Trauma A.  Patient's son at the bedside.  Dr. Rory Percy spoke to patient's husband over the phone and husband reporting patient was LKW at 0930 this morning.  Order to treat with tPA.  Pharmacy notified and to the bedside to mix tPA.  Order to place foley catheter. 2nd PIV placed. BP assessed and in tPA parameters.  6mg  tPA bolus given over 1 minute at 1105 followed by 57mg /hr for a total of 63mg  per pharmacy dosing.  Patient monitored frequently per post-tPA protocol.  Patient with some initial improvement in expressive aphasia, ability to follow commands and right arm movement.  Patient then unable to follow commands as well as right arm flaccid and decreased movement on the left side.  Dr. Rory Percy at the bedside discussing care with family and made aware.  Order for STAT CT.  Patient transported to CT and CT completed.  Dr. Rory Percy made aware.  Patient with enlarging hematomas to the right forehead and questionable hematoma to the right clavicle.  Dr. Rory Percy and Dr. Dayna Barker made aware. Sites marked. Patient to be admitted to ICU when bed becomes available.  Bedside handoff with ED RN Cricket.

## 2017-09-01 NOTE — ED Notes (Signed)
This RN was walking by nurses station pushing another patient upstairs, the safety mittens ordered for this patient arrived in the tube station and received by another RN.  This RN asked if she would be able to put them on patient, RN nodded understanding with mittens in hand.  When this RN came back down to ED and was pushing the empty stretcher by patients room family was standing in doorway, son stated patient had pulled foley catheter out while I was gone upstairs.  The mittens had not been applied. Some bleeding noted to exit site, pt cleaned up and placed on external foley catheter.

## 2017-09-01 NOTE — Progress Notes (Signed)
Pharmacist Code Stroke Response  Notified to mix tPA at 1054 by Dr. Rory Percy Delivered tPA to RN at 1102  Issues/delays encountered (if applicable): Pharmacy initially told TPA not indicated due to LSW of 2130 the previous night. LSW was subsequently changed and Pharmacy called back to the room to mix TPA.   Elicia Lamp, PharmD, BCPS Clinical Pharmacist Clinical phone for 09/01/2017 until 3:30pm: [phone] If after 3:30pm, please call main pharmacy at: [phone] 09/01/2017 11:13 AM

## 2017-09-01 NOTE — ED Notes (Addendum)
Family feels pt is somewhat less aggressive after Ativan was given.

## 2017-09-01 NOTE — ED Provider Notes (Signed)
[facility] EMERGENCY DEPARTMENT Provider Note   CSN: [id number] Arrival date & time: 09/01/17  1025   An emergency department physician performed an initial assessment on this suspected stroke patient at 1027(Caroline Blake).  History   Chief Complaint Chief Complaint  Patient presents with  . Code Stroke    HPI Caroline Blake is a 72 y.o. female.   Cerebrovascular Accident  This is a new problem. The current episode started 1 to 2 hours ago. The problem occurs constantly. The problem has not changed since onset.Pertinent negatives include no chest pain, no headaches and no shortness of breath. Nothing aggravates the symptoms. Nothing relieves the symptoms. She has tried nothing for the symptoms. The treatment provided no relief.   LEVEL V CAVEAT 2/2 Acuity of Condition and aphasia  Past Medical History:  Diagnosis Date  . COPD (chronic obstructive pulmonary disease) ([place])   . Stroke [facility])     Patient Active Problem List   Diagnosis Date Noted  . 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 Authorizing Provider  amLODipine (NORVASC) 10 MG tablet Take 10 mg by mouth daily.   Yes [provider]  buPROPion (WELLBUTRIN SR) 150 MG 12 hr tablet Take 150 mg by mouth every morning. 05/28/17  Yes [provider]  clopidogrel (PLAVIX) 75 MG tablet Take 75 mg by mouth daily. 05/27/17  Yes [provider]  conjugated estrogens (PREMARIN) vaginal cream Place 1 g vaginally daily as needed for itching. 09/06/14  Yes [provider]  donepezil (ARICEPT) 10 MG tablet Take 5 mg by mouth daily. 05/27/17  Yes [provider]  doxycycline (VIBRAMYCIN) 100 MG capsule Take 100 mg by mouth 2 (two) times daily. 08/24/17  Yes [provider]  ezetimibe (ZETIA) 10 MG tablet Take 10 mg by mouth daily.  04/02/13  Yes [provider]  HYDROcodone-acetaminophen (NORCO/VICODIN) 5-325 MG tablet Take 0.5 tablets by mouth every 6 (six) hours as needed for pain. 08/26/17  Yes [provider]  insulin aspart protamine- aspart (NOVOLOG MIX 70/30) (70-30) 100 UNIT/ML injection Inject 3 Units into the skin 2 (two) times daily.   Yes [provider]  lamoTRIgine (LAMICTAL) 100 MG tablet Take 100 mg by mouth 2 (two) times daily. 08/09/17  Yes [provider]  levothyroxine (SYNTHROID, LEVOTHROID) 150 MCG tablet Take 150 mcg by mouth daily. 08/12/17  Yes [provider]  metoprolol tartrate (LOPRESSOR) 50 MG tablet Take 50 mg by mouth daily. 03/12/13  Yes [provider]  metroNIDAZOLE (METROGEL) 0.75 % vaginal gel Place 1 Applicatorful vaginally daily as needed for itching. 07/29/17  Yes [provider]  mirabegron ER (MYRBETRIQ) 50 MG TB24 tablet Take 25 mg by mouth daily.   Yes [provider]  Omega-3 Fatty Acids (FISH OIL PO) Take 1 capsule by mouth daily.   Yes [provider]  phenazopyridine (PYRIDIUM) 200 MG tablet Take 200 mg by mouth daily as needed. UTI 08/24/17  Yes [provider]  pravastatin (PRAVACHOL) 80 MG tablet Take 80 mg by mouth daily. 05/27/17  Yes [provider]  propranolol (INDERAL) 40 MG tablet Take 40 mg by mouth daily. 05/28/17  Yes [provider]  sertraline (ZOLOFT) 100 MG tablet Take 200 mg by mouth daily. 08/09/17  Yes [provider]  traMADol (ULTRAM) 50 MG tablet Take 50 mg by mouth 3 (three) times daily as needed for pain. 02/25/17  Yes [provider]    Family History No family history on file.  Social History Social History   Tobacco Use  . Smoking status: Not on file  Substance Use Topics  . Alcohol use: Not on file  . Drug use: Not on file     Allergies   Atorvastatin; Carvedilol; Cinoxacin; Etodolac; Metoclopramide; Moxifloxacin; Paroxetine  hcl; Rosuvastatin calcium; Nitrofurantoin; Rivaroxaban; Sulfa antibiotics; Yellow dyes (non-tartrazine); Aspirin-dipyridamole er; and Pitavastatin   Review of Systems Review of Systems  Unable to perform ROS: Other (stroke, aphasia)  Respiratory: Negative for shortness of breath.   Cardiovascular: Negative for chest pain.  Neurological: Negative for headaches.     Physical Exam Updated Vital Signs BP 117/61   Pulse (!) 102   Temp (!) 97.4 F (36.3 C) (Temporal)   Resp (!) 25   Ht 5\' 6"  (1.676 m)   Wt 70.4 kg (155 lb 3.3 oz)   SpO2 100%   BMI 25.05 kg/m   Physical Exam  Constitutional: She appears well-developed and well-nourished.  HENT:  Head: Normocephalic and atraumatic.  Eyes: Conjunctivae and EOM are normal.  Neck: Normal range of motion.  Cardiovascular: Normal rate and regular rhythm.  Pulmonary/Chest: Effort normal and breath sounds normal. No stridor. No respiratory distress.  Abdominal: Soft. She exhibits no distension. There is no tenderness.  Musculoskeletal: Normal range of motion. She exhibits no edema or deformity.  Neurological: She is alert. She displays abnormal reflex. A cranial nerve deficit is present. She exhibits abnormal muscle tone. Coordination abnormal.  Patient with dysarthria, right arm weakness, right faciald roop.   Skin: Skin is warm and dry.  Nursing note and vitals reviewed.    ED Treatments / Results  Labs (all labs ordered are listed, but only abnormal results are displayed) Labs Reviewed  PROTIME-INR - Abnormal; Notable for the following components:      Result Value   Prothrombin Time 16.0 (*)    All other components within normal limits  CBC - Abnormal; Notable for the following components:   Hemoglobin 10.6 (*)    HCT 33.7 (*)    MCH 25.4 (*)    RDW 16.5 (*)    All other components within normal limits  DIFFERENTIAL - Abnormal; Notable for the following components:   Neutro Abs 8.8 (*)    All other components within  normal limits  COMPREHENSIVE METABOLIC PANEL - Abnormal; Notable for the following components:   CO2 16 (*)    Glucose, Bld 126 (*)    BUN 71 (*)    Creatinine, Ser 2.21 (*)    Calcium 6.0 (*)    Total Protein 4.9 (*)    Albumin 2.5 (*)    ALT 10 (*)    GFR calc non Af Amer 21 (*)    GFR calc Af Amer 25 (*)    All other components within normal limits  URINALYSIS, ROUTINE W REFLEX MICROSCOPIC - Abnormal; Notable for the following components:   Hgb urine dipstick SMALL (*)    All other components within normal limits  I-STAT CHEM 8, ED - Abnormal; Notable for the following components:   BUN 60 (*)    Creatinine, Ser 2.20 (*)    Glucose, Bld 121 (*)    Calcium, Ion 0.84 (*)    TCO2 18 (*)    Hemoglobin 11.2 (*)    HCT 33.0 (*)  All other components within normal limits  ETHANOL  APTT  RAPID URINE DRUG SCREEN, HOSP PERFORMED  I-STAT TROPONIN, ED    EKG  EKG Interpretation None       Radiology Ct Angio Head W Or Wo Contrast  Result Date: 09/01/2017 CLINICAL DATA:  Code stroke. Weakness and aphasia. Flaccid right side. EXAM: CT ANGIOGRAPHY HEAD AND NECK CT PERFUSION BRAIN TECHNIQUE: Multidetector CT imaging of the head and neck was performed using the standard protocol during bolus administration of intravenous contrast. Multiplanar CT image reconstructions and MIPs were obtained to evaluate the vascular anatomy. Carotid stenosis measurements (when applicable) are obtained utilizing NASCET criteria, using the distal internal carotid diameter as the denominator. Multiphase CT imaging of the brain was performed following IV bolus contrast injection. Subsequent parametric perfusion maps were calculated using RAPID software. CONTRAST:  Reference EMR COMPARISON:  None. FINDINGS: CT HEAD FINDINGS Brain: There is a large remote right MCA territory infarct affecting temporal and posterior frontal lobes. Remote left frontal operculum infarct. Remote perforator infarct with volume loss in  the left basal ganglia and deep white matter tracts. Age-indeterminate lacunar infarct in the left thalamus. Chronic small vessel ischemia. No hemorrhage, hydrocephalus, or masslike finding. Vascular: See below. Skull: Negative Sinuses/Orbits: Negative Other: Negative ASPECTS ([place] Stroke Program Early CT Score) Not scored given the degree of chronic change. CTA NECK FINDINGS Aortic arch: Atherosclerotic calcification.  Three vessel branching. Right carotid system: Moderate bifurcation atheromatous plaque at the common carotid and bifurcation without ulceration or flow limiting stenosis. Negative for beading. Left carotid system: Mild-to-moderate atheromatous plaque at the common carotid bifurcation without flow limiting stenosis or ulceration. Vertebral arteries: No proximal subclavian flow limiting stenosis. Codominant vertebral arteries. No vertebral stenosis or irregularity. Skeleton: Mid right clavicle fracture that is unhealed/nonunited. Other neck: There is a 2 cm ovoid mass with possible polar artery at the central thoracic inlet. Thyroidectomy. There is thyroid ultrasound in 2015 showing goiter. Upper chest: Centrilobular emphysema. Review of the MIP images confirms the above findings CTA HEAD FINDINGS Anterior circulation: Carotid siphon atherosclerosis without flow limiting stenosis or irregularity. Short segment occlusionof the left distal M1 segment just beyond the anterior temporal branch. Left M2 branches are opacified but appear under filled compared to the right. Atherosclerotic irregularity of right M1 and M2 branches. Negative for aneurysm. Posterior circulation: Vertebral and basilar and widely patent arteries are smooth. Hypoplastic left P1 segment. Moderate to advanced atheromatous narrowing of the distal right P2 segment. Venous sinuses: Patent as permitted by contrast timing. Anatomic variants: As above Delayed phase: Not obtained in the emergent setting Review of the MIP images confirms  the above findings CT Brain Perfusion Findings: Perfusion maps have not yet reached PACS at time of signing, but rapid report was reviewed elsewhere. CBF (<30%) Volume: 19mL Perfusion (Tmax>6.0s) volume: 68mL These results were called by telephone at the time of interpretation on 09/01/2017 at 10:56 am to Dr. Amie Portland , who verbally acknowledged these results. IMPRESSION: 1. Distal left M1 short segment occlusion with opacified but under filled left M2 branches. This occlusion is age-indeterminate. No infarct by cerebral perfusion but there is 18cc penumbra/ischemia. 2. No flow limiting stenosis or ulceration in the neck. The patient is wearing an implanted loop recorder. 3. Multifocal remote MCA distribution infarcts. 4. Age-indeterminate left thalamus lacunar infarct. 5. Moderate to advanced right distal P2 segment stenosis. 6. Aortic Atherosclerosis (ICD10-I70.0) and Emphysema (ICD10-J43.9). 7. Unhealed right mid clavicle fracture with surrounding hemorrhage, favored subacute. 8. 2 cm nodule  at the thoracic inlet that is high-density or enhancing. Question polar vessel. 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 Electronically Signed   By: Monte Fantasia M.D.   On: 09/01/2017 11:06   Ct Head Wo Contrast  Result Date: 09/01/2017 CLINICAL DATA:  Stroke.  Post tPA EXAM: CT HEAD WITHOUT CONTRAST TECHNIQUE: Contiguous axial images were obtained from the base of the skull through the vertex without intravenous contrast. COMPARISON:  CT head and CTA head 09/01/2017 FINDINGS: Brain: Negative for acute hemorrhage Chronic infarct right temporoparietal lobe unchanged. Chronic infarct left inferior frontal lobe unchanged. Chronic infarct in the left internal capsule unchanged. Ventricle size normal. No midline shift CT perfusion abnormality in the left parietal lobe without definite CT findings of acute infarct at this time. Vascular: Intravenous contrast  is noted from earlier CTA. Skull: Negative Sinuses/Orbits: Negative Other: None IMPRESSION: No acute hemorrhage following tPA. No change from head CT earlier today. Chronic ischemic changes bilaterally. Electronically Signed   By: Franchot Gallo M.D.   On: 09/01/2017 12:24   Ct Angio Neck W Or Wo Contrast  Result Date: 09/01/2017 CLINICAL DATA:  Code stroke. Weakness and aphasia. Flaccid right side. EXAM: CT ANGIOGRAPHY HEAD AND NECK CT PERFUSION BRAIN TECHNIQUE: Multidetector CT imaging of the head and neck was performed using the standard protocol during bolus administration of intravenous contrast. Multiplanar CT image reconstructions and MIPs were obtained to evaluate the vascular anatomy. Carotid stenosis measurements (when applicable) are obtained utilizing NASCET criteria, using the distal internal carotid diameter as the denominator. Multiphase CT imaging of the brain was performed following IV bolus contrast injection. Subsequent parametric perfusion maps were calculated using RAPID software. CONTRAST:  Reference EMR COMPARISON:  None. FINDINGS: CT HEAD FINDINGS Brain: There is a large remote right MCA territory infarct affecting temporal and posterior frontal lobes. Remote left frontal operculum infarct. Remote perforator infarct with volume loss in the left basal ganglia and deep white matter tracts. Age-indeterminate lacunar infarct in the left thalamus. Chronic small vessel ischemia. No hemorrhage, hydrocephalus, or masslike finding. Vascular: See below. Skull: Negative Sinuses/Orbits: Negative Other: Negative ASPECTS ([place] Stroke Program Early CT Score) Not scored given the degree of chronic change. CTA NECK FINDINGS Aortic arch: Atherosclerotic calcification.  Three vessel branching. Right carotid system: Moderate bifurcation atheromatous plaque at the common carotid and bifurcation without ulceration or flow limiting stenosis. Negative for beading. Left carotid system: Mild-to-moderate  atheromatous plaque at the common carotid bifurcation without flow limiting stenosis or ulceration. Vertebral arteries: No proximal subclavian flow limiting stenosis. Codominant vertebral arteries. No vertebral stenosis or irregularity. Skeleton: Mid right clavicle fracture that is unhealed/nonunited. Other neck: There is a 2 cm ovoid mass with possible polar artery at the central thoracic inlet. Thyroidectomy. There is thyroid ultrasound in 2015 showing goiter. Upper chest: Centrilobular emphysema. Review of the MIP images confirms the above findings CTA HEAD FINDINGS Anterior circulation: Carotid siphon atherosclerosis without flow limiting stenosis or irregularity. Short segment occlusionof the left distal M1 segment just beyond the anterior temporal branch. Left M2 branches are opacified but appear under filled compared to the right. Atherosclerotic irregularity of right M1 and M2 branches. Negative for aneurysm. Posterior circulation: Vertebral and basilar and widely patent arteries are smooth. Hypoplastic left P1 segment. Moderate to advanced atheromatous narrowing of the distal right P2 segment. Venous sinuses: Patent as permitted by contrast timing. Anatomic variants: As above Delayed phase: Not obtained in the emergent setting Review of the MIP images  confirms the above findings CT Brain Perfusion Findings: Perfusion maps have not yet reached PACS at time of signing, but rapid report was reviewed elsewhere. CBF (<30%) Volume: 80mL Perfusion (Tmax>6.0s) volume: 74mL These results were called by telephone at the time of interpretation on 09/01/2017 at 10:56 am to Dr. Amie Portland , who verbally acknowledged these results. IMPRESSION: 1. Distal left M1 short segment occlusion with opacified but under filled left M2 branches. This occlusion is age-indeterminate. No infarct by cerebral perfusion but there is 18cc penumbra/ischemia. 2. No flow limiting stenosis or ulceration in the neck. The patient is wearing an  implanted loop recorder. 3. Multifocal remote MCA distribution infarcts. 4. Age-indeterminate left thalamus lacunar infarct. 5. Moderate to advanced right distal P2 segment stenosis. 6. Aortic Atherosclerosis (ICD10-I70.0) and Emphysema (ICD10-J43.9). 7. Unhealed right mid clavicle fracture with surrounding hemorrhage, favored subacute. 8. 2 cm nodule at the thoracic inlet that is high-density or enhancing. Question polar vessel. 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 Electronically Signed   By: Monte Fantasia M.D.   On: 09/01/2017 11:06   Ct Cerebral Perfusion W Contrast  Result Date: 09/01/2017 CLINICAL DATA:  Code stroke. Weakness and aphasia. Flaccid right side. EXAM: CT ANGIOGRAPHY HEAD AND NECK CT PERFUSION BRAIN TECHNIQUE: Multidetector CT imaging of the head and neck was performed using the standard protocol during bolus administration of intravenous contrast. Multiplanar CT image reconstructions and MIPs were obtained to evaluate the vascular anatomy. Carotid stenosis measurements (when applicable) are obtained utilizing NASCET criteria, using the distal internal carotid diameter as the denominator. Multiphase CT imaging of the brain was performed following IV bolus contrast injection. Subsequent parametric perfusion maps were calculated using RAPID software. CONTRAST:  Reference EMR COMPARISON:  None. FINDINGS: CT HEAD FINDINGS Brain: There is a large remote right MCA territory infarct affecting temporal and posterior frontal lobes. Remote left frontal operculum infarct. Remote perforator infarct with volume loss in the left basal ganglia and deep white matter tracts. Age-indeterminate lacunar infarct in the left thalamus. Chronic small vessel ischemia. No hemorrhage, hydrocephalus, or masslike finding. Vascular: See below. Skull: Negative Sinuses/Orbits: Negative Other: Negative ASPECTS ([place] Stroke Program Early CT Score) Not  scored given the degree of chronic change. CTA NECK FINDINGS Aortic arch: Atherosclerotic calcification.  Three vessel branching. Right carotid system: Moderate bifurcation atheromatous plaque at the common carotid and bifurcation without ulceration or flow limiting stenosis. Negative for beading. Left carotid system: Mild-to-moderate atheromatous plaque at the common carotid bifurcation without flow limiting stenosis or ulceration. Vertebral arteries: No proximal subclavian flow limiting stenosis. Codominant vertebral arteries. No vertebral stenosis or irregularity. Skeleton: Mid right clavicle fracture that is unhealed/nonunited. Other neck: There is a 2 cm ovoid mass with possible polar artery at the central thoracic inlet. Thyroidectomy. There is thyroid ultrasound in 2015 showing goiter. Upper chest: Centrilobular emphysema. Review of the MIP images confirms the above findings CTA HEAD FINDINGS Anterior circulation: Carotid siphon atherosclerosis without flow limiting stenosis or irregularity. Short segment occlusionof the left distal M1 segment just beyond the anterior temporal branch. Left M2 branches are opacified but appear under filled compared to the right. Atherosclerotic irregularity of right M1 and M2 branches. Negative for aneurysm. Posterior circulation: Vertebral and basilar and widely patent arteries are smooth. Hypoplastic left P1 segment. Moderate to advanced atheromatous narrowing of the distal right P2 segment. Venous sinuses: Patent as permitted by contrast timing. Anatomic variants: As above Delayed phase: Not obtained in the emergent setting Review  of the MIP images confirms the above findings CT Brain Perfusion Findings: Perfusion maps have not yet reached PACS at time of signing, but rapid report was reviewed elsewhere. CBF (<30%) Volume: 66mL Perfusion (Tmax>6.0s) volume: 18mL These results were called by telephone at the time of interpretation on 09/01/2017 at 10:56 am to Dr. Amie Portland  , who verbally acknowledged these results. IMPRESSION: 1. Distal left M1 short segment occlusion with opacified but under filled left M2 branches. This occlusion is age-indeterminate. No infarct by cerebral perfusion but there is 18cc penumbra/ischemia. 2. No flow limiting stenosis or ulceration in the neck. The patient is wearing an implanted loop recorder. 3. Multifocal remote MCA distribution infarcts. 4. Age-indeterminate left thalamus lacunar infarct. 5. Moderate to advanced right distal P2 segment stenosis. 6. Aortic Atherosclerosis (ICD10-I70.0) and Emphysema (ICD10-J43.9). 7. Unhealed right mid clavicle fracture with surrounding hemorrhage, favored subacute. 8. 2 cm nodule at the thoracic inlet that is high-density or enhancing. Question polar vessel. 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 Electronically Signed   By: Monte Fantasia M.D.   On: 09/01/2017 11:06   Ct Head Code Stroke Wo Contrast  Result Date: 09/01/2017 CLINICAL DATA:  Code stroke. Weakness and aphasia. Flaccid right side. EXAM: CT ANGIOGRAPHY HEAD AND NECK CT PERFUSION BRAIN TECHNIQUE: Multidetector CT imaging of the head and neck was performed using the standard protocol during bolus administration of intravenous contrast. Multiplanar CT image reconstructions and MIPs were obtained to evaluate the vascular anatomy. Carotid stenosis measurements (when applicable) are obtained utilizing NASCET criteria, using the distal internal carotid diameter as the denominator. Multiphase CT imaging of the brain was performed following IV bolus contrast injection. Subsequent parametric perfusion maps were calculated using RAPID software. CONTRAST:  Reference EMR COMPARISON:  None. FINDINGS: CT HEAD FINDINGS Brain: There is a large remote right MCA territory infarct affecting temporal and posterior frontal lobes. Remote left frontal operculum infarct. Remote perforator infarct with volume  loss in the left basal ganglia and deep white matter tracts. Age-indeterminate lacunar infarct in the left thalamus. Chronic small vessel ischemia. No hemorrhage, hydrocephalus, or masslike finding. Vascular: See below. Skull: Negative Sinuses/Orbits: Negative Other: Negative ASPECTS ([place] Stroke Program Early CT Score) Not scored given the degree of chronic change. CTA NECK FINDINGS Aortic arch: Atherosclerotic calcification.  Three vessel branching. Right carotid system: Moderate bifurcation atheromatous plaque at the common carotid and bifurcation without ulceration or flow limiting stenosis. Negative for beading. Left carotid system: Mild-to-moderate atheromatous plaque at the common carotid bifurcation without flow limiting stenosis or ulceration. Vertebral arteries: No proximal subclavian flow limiting stenosis. Codominant vertebral arteries. No vertebral stenosis or irregularity. Skeleton: Mid right clavicle fracture that is unhealed/nonunited. Other neck: There is a 2 cm ovoid mass with possible polar artery at the central thoracic inlet. Thyroidectomy. There is thyroid ultrasound in 2015 showing goiter. Upper chest: Centrilobular emphysema. Review of the MIP images confirms the above findings CTA HEAD FINDINGS Anterior circulation: Carotid siphon atherosclerosis without flow limiting stenosis or irregularity. Short segment occlusionof the left distal M1 segment just beyond the anterior temporal branch. Left M2 branches are opacified but appear under filled compared to the right. Atherosclerotic irregularity of right M1 and M2 branches. Negative for aneurysm. Posterior circulation: Vertebral and basilar and widely patent arteries are smooth. Hypoplastic left P1 segment. Moderate to advanced atheromatous narrowing of the distal right P2 segment. Venous sinuses: Patent as permitted by contrast timing. Anatomic variants: As above Delayed phase: Not obtained  in the emergent setting Review of the MIP images  confirms the above findings CT Brain Perfusion Findings: Perfusion maps have not yet reached PACS at time of signing, but rapid report was reviewed elsewhere. CBF (<30%) Volume: 8mL Perfusion (Tmax>6.0s) volume: 1mL These results were called by telephone at the time of interpretation on 09/01/2017 at 10:56 am to Dr. Amie Portland , who verbally acknowledged these results. IMPRESSION: 1. Distal left M1 short segment occlusion with opacified but under filled left M2 branches. This occlusion is age-indeterminate. No infarct by cerebral perfusion but there is 18cc penumbra/ischemia. 2. No flow limiting stenosis or ulceration in the neck. The patient is wearing an implanted loop recorder. 3. Multifocal remote MCA distribution infarcts. 4. Age-indeterminate left thalamus lacunar infarct. 5. Moderate to advanced right distal P2 segment stenosis. 6. Aortic Atherosclerosis (ICD10-I70.0) and Emphysema (ICD10-J43.9). 7. Unhealed right mid clavicle fracture with surrounding hemorrhage, favored subacute. 8. 2 cm nodule at the thoracic inlet that is high-density or enhancing. Question polar vessel. 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 Electronically Signed   By: Monte Fantasia M.D.   On: 09/01/2017 11:06    Procedures Procedures (including critical care time)  CRITICAL CARE Performed by: Merrily Pew Total critical care time: 35 minutes Critical care time was exclusive of separately billable procedures and treating other patients. Critical care was necessary to treat or prevent imminent or life-threatening deterioration. Critical care was time spent personally by me on the following activities: development of treatment plan with patient and/or surrogate as well as nursing, discussions with consultants, evaluation of patient's response to treatment, examination of patient, obtaining history from patient or surrogate, ordering and performing treatments  and interventions, ordering and review of laboratory studies, ordering and review of radiographic studies, pulse oximetry and re-evaluation of patient's condition.  Medications Ordered in ED Medications   stroke: mapping our early stages of recovery book (not administered)  0.9 %  sodium chloride infusion ( Intravenous New Bag/Given 09/01/17 1513)  pantoprazole (PROTONIX) injection 40 mg (not administered)  nicardipine (CARDENE) 20mg  in 0.86% saline 260ml IV infusion (0.1 mg/ml) (7.5 mg/hr Intravenous Rate/Dose Change 09/01/17 1332)  alteplase (ACTIVASE) 1 mg/mL infusion 63 mg (0 mg/kg  70.4 kg Intravenous Stopped 09/01/17 1205)  ondansetron (ZOFRAN) injection 4 mg (4 mg Intravenous Given 09/01/17 1315)  ondansetron (ZOFRAN) injection 4 mg (4 mg Intravenous Given 09/01/17 1314)  LORazepam (ATIVAN) injection 0.5 mg (0.5 mg Intravenous Given 09/01/17 1434)  diphenhydrAMINE (BENADRYL) injection 12.5 mg (12.5 mg Intravenous Given 09/01/17 1545)     Initial Impression / Assessment and Plan / ED Course  I have reviewed the triage vital signs and the nursing notes.  Pertinent labs & imaging results that were available during my care of the patient were reviewed by me and considered in my medical decision making (see chart for details).  Code stroke 2/2 right sided facial droop, aphasia, dysarthria, right sided weakness. tpa given without significant improvement in symptoms but no significant worsening.   Final Clinical Impressions(s) / ED Diagnoses   Final diagnoses:  Cerebral infarction, unspecified mechanism ([place])      Daniela Hernan, Corene Cornea, MD 09/01/17 1658

## 2017-09-01 NOTE — ED Notes (Signed)
Husband Shanon Brow at bedside removed pt's left hand rings and placed them on his key chain.

## 2017-09-01 NOTE — ED Notes (Signed)
Family at bedside. 

## 2017-09-01 NOTE — ED Triage Notes (Signed)
MD Rory Percy talked with husband and was reported that patient had change in Neuro status at 0930 this morning.

## 2017-09-01 NOTE — ED Triage Notes (Signed)
Per [facility], Pt is coming from home with complaints of right sided weakness, Right facial droop, slurred speech, and expressive aphasia that was reported by EMS to start when she woke up this morning. Vitals per EMS 136/94, 126 CBG, 81 HR, 2L Chronic Oxygen

## 2017-09-01 NOTE — Consult Note (Signed)
Name: Caroline Blake MRN: [id number] DOB: 01-Jun-1946    ADMISSION DATE:  09/01/2017 CONSULTATION DATE:  1/24  REFERRING MD :  Dr. Rory Percy  CHIEF COMPLAINT:  CVA  HISTORY OF PRESENT ILLNESS:  Patient is encephalopathic and/or intubated. Therefore history has been obtained from chart review. 72 year old female with PMH as below, which is significant for COPD and stroke. Husband reported that at baseline she is only oriented to herself with some baseline stroke and residual effects from her former stroke. Recent course complicated by a fall resulting in a fracture R clavicle (not read as such at the time) and a R lower extremity fracture (2nd/3rd/4th MT base, medial/middle/lateral cuneiform, 2nd MT neck fractures based on ED notes). She was discharged to home with plans for ortho follow up. Then 1/24 she awoke from sleep as normal but a short time after (around 930am) she developed aphasia and facial droop. She was brought to ED as a code stroke. She had CT scan negative for hemorrhage and was given tPA for presumed CVA. CTA demonstrated L M1 occlusion. She was deemed to not be a candidate for IR intervention. She became hypertensive requiring nicardipine infusion. She was admitted to ICU and PCCM asked to see.    SIGNIFICANT EVENTS  1/6 RLE fracture diagnosed 1/20 Fall, R clavicle fracture 1/24 admit for CVA, tPA given.   STUDIES:  CT head/angio/perfusion 1/24 > Distal left M1 short segment occlusion with opacified but under filled left M2 branches. This occlusion is age-indeterminate. No infarct by cerebral perfusion but there is 18cc penumbra/ischemia. No flow limiting stenosis or ulceration in the neck. The patient is wearing an implanted loop recorder. Multifocal remote MCA distribution infarcts. Age-indeterminate left thalamus lacunar infarct. Moderate to advanced right distal P2 segment stenosis. Unhealed right mid clavicle fracture with surrounding hemorrhage, favored subacute. 2 cm nodule  at the thoracic inlet that is high-density or enhancing. Question polar vessel. There is changes of thyroidectomy.  PAST MEDICAL HISTORY :   has a past medical history of COPD (chronic obstructive pulmonary disease) ([place]) and Stroke ([place]).  has no past surgical history on file. Prior to Admission medications   Not on File   Allergies  Allergen Reactions  . Sulfa Antibiotics   . Yellow Dyes (Non-Tartrazine)     FAMILY HISTORY:  family history is not on file. SOCIAL HISTORY:    REVIEW OF SYSTEMS:   Unable as patient is encephalopathic  SUBJECTIVE:   VITAL SIGNS: Temp:  [97.4 F (36.3 C)] 97.4 F (36.3 C) (01/24 1101) Pulse Rate:  [69-98] 96 (01/24 1330) Resp:  [10-30] 21 (01/24 1330) BP: (103-183)/(63-99) 103/76 (01/24 1330) SpO2:  [95 %-100 %] 98 % (01/24 1330) Weight:  [70.4 kg (155 lb 3.3 oz)] 70.4 kg (155 lb 3.3 oz) (01/24 1109)  PHYSICAL EXAMINATION: General:  Elderly female with mild agitation/distress/tremor.  Neuro:  Awake, aphasic. Does say words, but no meaningful response.  HEENT: PERRL, no JVD, R frontal scalp hematoma.  Cardiovascular:  Tachy, regular Lungs:  Clear Abdomen:  Soft, Musculoskeletal:  Bruising and protuberance over R mid clavicle at site of fracture Skin:  Grossly intact  Recent Labs  Lab 09/01/17 1035 09/01/17 1051 09/01/17 1104  NA 141 135 138  K 4.0 3.7 3.7  CL 110 108 106  CO2  --  16*  --   BUN 67* 71* 60*  CREATININE 2.40* 2.21* 2.20*  GLUCOSE 128* 126* 121*   Recent Labs  Lab 09/01/17 1035 09/01/17 1051 09/01/17  1104  HGB 13.3 10.6* 11.2*  HCT 39.0 33.7* 33.0*  WBC  --  10.4  --   PLT  --  211  --    Ct Angio Head W Or Wo Contrast  Result Date: 09/01/2017 CLINICAL DATA:  Code stroke. Weakness and aphasia. Flaccid right side. EXAM: CT ANGIOGRAPHY HEAD AND NECK CT PERFUSION BRAIN TECHNIQUE: Multidetector CT imaging of the head and neck was performed using the standard protocol during bolus administration of intravenous  contrast. Multiplanar CT image reconstructions and MIPs were obtained to evaluate the vascular anatomy. Carotid stenosis measurements (when applicable) are obtained utilizing NASCET criteria, using the distal internal carotid diameter as the denominator. Multiphase CT imaging of the brain was performed following IV bolus contrast injection. Subsequent parametric perfusion maps were calculated using RAPID software. CONTRAST:  Reference EMR COMPARISON:  None. FINDINGS: CT HEAD FINDINGS Brain: There is a large remote right MCA territory infarct affecting temporal and posterior frontal lobes. Remote left frontal operculum infarct. Remote perforator infarct with volume loss in the left basal ganglia and deep white matter tracts. Age-indeterminate lacunar infarct in the left thalamus. Chronic small vessel ischemia. No hemorrhage, hydrocephalus, or masslike finding. Vascular: See below. Skull: Negative Sinuses/Orbits: Negative Other: Negative ASPECTS ([place] Stroke Program Early CT Score) Not scored given the degree of chronic change. CTA NECK FINDINGS Aortic arch: Atherosclerotic calcification.  Three vessel branching. Right carotid system: Moderate bifurcation atheromatous plaque at the common carotid and bifurcation without ulceration or flow limiting stenosis. Negative for beading. Left carotid system: Mild-to-moderate atheromatous plaque at the common carotid bifurcation without flow limiting stenosis or ulceration. Vertebral arteries: No proximal subclavian flow limiting stenosis. Codominant vertebral arteries. No vertebral stenosis or irregularity. Skeleton: Mid right clavicle fracture that is unhealed/nonunited. Other neck: There is a 2 cm ovoid mass with possible polar artery at the central thoracic inlet. Thyroidectomy. There is thyroid ultrasound in 2015 showing goiter. Upper chest: Centrilobular emphysema. Review of the MIP images confirms the above findings CTA HEAD FINDINGS Anterior circulation: Carotid  siphon atherosclerosis without flow limiting stenosis or irregularity. Short segment occlusionof the left distal M1 segment just beyond the anterior temporal branch. Left M2 branches are opacified but appear under filled compared to the right. Atherosclerotic irregularity of right M1 and M2 branches. Negative for aneurysm. Posterior circulation: Vertebral and basilar and widely patent arteries are smooth. Hypoplastic left P1 segment. Moderate to advanced atheromatous narrowing of the distal right P2 segment. Venous sinuses: Patent as permitted by contrast timing. Anatomic variants: As above Delayed phase: Not obtained in the emergent setting Review of the MIP images confirms the above findings CT Brain Perfusion Findings: Perfusion maps have not yet reached PACS at time of signing, but rapid report was reviewed elsewhere. CBF (<30%) Volume: 74mL Perfusion (Tmax>6.0s) volume: 40mL These results were called by telephone at the time of interpretation on 09/01/2017 at 10:56 am to Dr. Amie Portland , who verbally acknowledged these results. IMPRESSION: 1. Distal left M1 short segment occlusion with opacified but under filled left M2 branches. This occlusion is age-indeterminate. No infarct by cerebral perfusion but there is 18cc penumbra/ischemia. 2. No flow limiting stenosis or ulceration in the neck. The patient is wearing an implanted loop recorder. 3. Multifocal remote MCA distribution infarcts. 4. Age-indeterminate left thalamus lacunar infarct. 5. Moderate to advanced right distal P2 segment stenosis. 6. Aortic Atherosclerosis (ICD10-I70.0) and Emphysema (ICD10-J43.9). 7. Unhealed right mid clavicle fracture with surrounding hemorrhage, favored subacute. 8. 2 cm nodule at the thoracic inlet that  is high-density or enhancing. Question polar vessel. 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 Electronically Signed   By: Monte Fantasia M.D.   On: 09/01/2017  11:06   Ct Head Wo Contrast  Result Date: 09/01/2017 CLINICAL DATA:  Stroke.  Post tPA EXAM: CT HEAD WITHOUT CONTRAST TECHNIQUE: Contiguous axial images were obtained from the base of the skull through the vertex without intravenous contrast. COMPARISON:  CT head and CTA head 09/01/2017 FINDINGS: Brain: Negative for acute hemorrhage Chronic infarct right temporoparietal lobe unchanged. Chronic infarct left inferior frontal lobe unchanged. Chronic infarct in the left internal capsule unchanged. Ventricle size normal. No midline shift CT perfusion abnormality in the left parietal lobe without definite CT findings of acute infarct at this time. Vascular: Intravenous contrast is noted from earlier CTA. Skull: Negative Sinuses/Orbits: Negative Other: None IMPRESSION: No acute hemorrhage following tPA. No change from head CT earlier today. Chronic ischemic changes bilaterally. Electronically Signed   By: Franchot Gallo M.D.   On: 09/01/2017 12:24   Ct Angio Neck W Or Wo Contrast  Result Date: 09/01/2017 CLINICAL DATA:  Code stroke. Weakness and aphasia. Flaccid right side. EXAM: CT ANGIOGRAPHY HEAD AND NECK CT PERFUSION BRAIN TECHNIQUE: Multidetector CT imaging of the head and neck was performed using the standard protocol during bolus administration of intravenous contrast. Multiplanar CT image reconstructions and MIPs were obtained to evaluate the vascular anatomy. Carotid stenosis measurements (when applicable) are obtained utilizing NASCET criteria, using the distal internal carotid diameter as the denominator. Multiphase CT imaging of the brain was performed following IV bolus contrast injection. Subsequent parametric perfusion maps were calculated using RAPID software. CONTRAST:  Reference EMR COMPARISON:  None. FINDINGS: CT HEAD FINDINGS Brain: There is a large remote right MCA territory infarct affecting temporal and posterior frontal lobes. Remote left frontal operculum infarct. Remote perforator infarct  with volume loss in the left basal ganglia and deep white matter tracts. Age-indeterminate lacunar infarct in the left thalamus. Chronic small vessel ischemia. No hemorrhage, hydrocephalus, or masslike finding. Vascular: See below. Skull: Negative Sinuses/Orbits: Negative Other: Negative ASPECTS ([place] Stroke Program Early CT Score) Not scored given the degree of chronic change. CTA NECK FINDINGS Aortic arch: Atherosclerotic calcification.  Three vessel branching. Right carotid system: Moderate bifurcation atheromatous plaque at the common carotid and bifurcation without ulceration or flow limiting stenosis. Negative for beading. Left carotid system: Mild-to-moderate atheromatous plaque at the common carotid bifurcation without flow limiting stenosis or ulceration. Vertebral arteries: No proximal subclavian flow limiting stenosis. Codominant vertebral arteries. No vertebral stenosis or irregularity. Skeleton: Mid right clavicle fracture that is unhealed/nonunited. Other neck: There is a 2 cm ovoid mass with possible polar artery at the central thoracic inlet. Thyroidectomy. There is thyroid ultrasound in 2015 showing goiter. Upper chest: Centrilobular emphysema. Review of the MIP images confirms the above findings CTA HEAD FINDINGS Anterior circulation: Carotid siphon atherosclerosis without flow limiting stenosis or irregularity. Short segment occlusionof the left distal M1 segment just beyond the anterior temporal branch. Left M2 branches are opacified but appear under filled compared to the right. Atherosclerotic irregularity of right M1 and M2 branches. Negative for aneurysm. Posterior circulation: Vertebral and basilar and widely patent arteries are smooth. Hypoplastic left P1 segment. Moderate to advanced atheromatous narrowing of the distal right P2 segment. Venous sinuses: Patent as permitted by contrast timing. Anatomic variants: As above Delayed phase: Not obtained in the emergent setting Review of the  MIP images confirms the above findings  CT Brain Perfusion Findings: Perfusion maps have not yet reached PACS at time of signing, but rapid report was reviewed elsewhere. CBF (<30%) Volume: 77mL Perfusion (Tmax>6.0s) volume: 43mL These results were called by telephone at the time of interpretation on 09/01/2017 at 10:56 am to Dr. Amie Portland , who verbally acknowledged these results. IMPRESSION: 1. Distal left M1 short segment occlusion with opacified but under filled left M2 branches. This occlusion is age-indeterminate. No infarct by cerebral perfusion but there is 18cc penumbra/ischemia. 2. No flow limiting stenosis or ulceration in the neck. The patient is wearing an implanted loop recorder. 3. Multifocal remote MCA distribution infarcts. 4. Age-indeterminate left thalamus lacunar infarct. 5. Moderate to advanced right distal P2 segment stenosis. 6. Aortic Atherosclerosis (ICD10-I70.0) and Emphysema (ICD10-J43.9). 7. Unhealed right mid clavicle fracture with surrounding hemorrhage, favored subacute. 8. 2 cm nodule at the thoracic inlet that is high-density or enhancing. Question polar vessel. 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 Electronically Signed   By: Monte Fantasia M.D.   On: 09/01/2017 11:06   Ct Cerebral Perfusion W Contrast  Result Date: 09/01/2017 CLINICAL DATA:  Code stroke. Weakness and aphasia. Flaccid right side. EXAM: CT ANGIOGRAPHY HEAD AND NECK CT PERFUSION BRAIN TECHNIQUE: Multidetector CT imaging of the head and neck was performed using the standard protocol during bolus administration of intravenous contrast. Multiplanar CT image reconstructions and MIPs were obtained to evaluate the vascular anatomy. Carotid stenosis measurements (when applicable) are obtained utilizing NASCET criteria, using the distal internal carotid diameter as the denominator. Multiphase CT imaging of the brain was performed following IV bolus  contrast injection. Subsequent parametric perfusion maps were calculated using RAPID software. CONTRAST:  Reference EMR COMPARISON:  None. FINDINGS: CT HEAD FINDINGS Brain: There is a large remote right MCA territory infarct affecting temporal and posterior frontal lobes. Remote left frontal operculum infarct. Remote perforator infarct with volume loss in the left basal ganglia and deep white matter tracts. Age-indeterminate lacunar infarct in the left thalamus. Chronic small vessel ischemia. No hemorrhage, hydrocephalus, or masslike finding. Vascular: See below. Skull: Negative Sinuses/Orbits: Negative Other: Negative ASPECTS ([place] Stroke Program Early CT Score) Not scored given the degree of chronic change. CTA NECK FINDINGS Aortic arch: Atherosclerotic calcification.  Three vessel branching. Right carotid system: Moderate bifurcation atheromatous plaque at the common carotid and bifurcation without ulceration or flow limiting stenosis. Negative for beading. Left carotid system: Mild-to-moderate atheromatous plaque at the common carotid bifurcation without flow limiting stenosis or ulceration. Vertebral arteries: No proximal subclavian flow limiting stenosis. Codominant vertebral arteries. No vertebral stenosis or irregularity. Skeleton: Mid right clavicle fracture that is unhealed/nonunited. Other neck: There is a 2 cm ovoid mass with possible polar artery at the central thoracic inlet. Thyroidectomy. There is thyroid ultrasound in 2015 showing goiter. Upper chest: Centrilobular emphysema. Review of the MIP images confirms the above findings CTA HEAD FINDINGS Anterior circulation: Carotid siphon atherosclerosis without flow limiting stenosis or irregularity. Short segment occlusionof the left distal M1 segment just beyond the anterior temporal branch. Left M2 branches are opacified but appear under filled compared to the right. Atherosclerotic irregularity of right M1 and M2 branches. Negative for aneurysm.  Posterior circulation: Vertebral and basilar and widely patent arteries are smooth. Hypoplastic left P1 segment. Moderate to advanced atheromatous narrowing of the distal right P2 segment. Venous sinuses: Patent as permitted by contrast timing. Anatomic variants: As above Delayed phase: Not obtained in the emergent setting Review of the MIP images  confirms the above findings CT Brain Perfusion Findings: Perfusion maps have not yet reached PACS at time of signing, but rapid report was reviewed elsewhere. CBF (<30%) Volume: 35mL Perfusion (Tmax>6.0s) volume: 56mL These results were called by telephone at the time of interpretation on 09/01/2017 at 10:56 am to Dr. Amie Portland , who verbally acknowledged these results. IMPRESSION: 1. Distal left M1 short segment occlusion with opacified but under filled left M2 branches. This occlusion is age-indeterminate. No infarct by cerebral perfusion but there is 18cc penumbra/ischemia. 2. No flow limiting stenosis or ulceration in the neck. The patient is wearing an implanted loop recorder. 3. Multifocal remote MCA distribution infarcts. 4. Age-indeterminate left thalamus lacunar infarct. 5. Moderate to advanced right distal P2 segment stenosis. 6. Aortic Atherosclerosis (ICD10-I70.0) and Emphysema (ICD10-J43.9). 7. Unhealed right mid clavicle fracture with surrounding hemorrhage, favored subacute. 8. 2 cm nodule at the thoracic inlet that is high-density or enhancing. Question polar vessel. 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 Electronically Signed   By: Monte Fantasia M.D.   On: 09/01/2017 11:06   Ct Head Code Stroke Wo Contrast  Result Date: 09/01/2017 CLINICAL DATA:  Code stroke. Weakness and aphasia. Flaccid right side. EXAM: CT ANGIOGRAPHY HEAD AND NECK CT PERFUSION BRAIN TECHNIQUE: Multidetector CT imaging of the head and neck was performed using the standard protocol during bolus administration of  intravenous contrast. Multiplanar CT image reconstructions and MIPs were obtained to evaluate the vascular anatomy. Carotid stenosis measurements (when applicable) are obtained utilizing NASCET criteria, using the distal internal carotid diameter as the denominator. Multiphase CT imaging of the brain was performed following IV bolus contrast injection. Subsequent parametric perfusion maps were calculated using RAPID software. CONTRAST:  Reference EMR COMPARISON:  None. FINDINGS: CT HEAD FINDINGS Brain: There is a large remote right MCA territory infarct affecting temporal and posterior frontal lobes. Remote left frontal operculum infarct. Remote perforator infarct with volume loss in the left basal ganglia and deep white matter tracts. Age-indeterminate lacunar infarct in the left thalamus. Chronic small vessel ischemia. No hemorrhage, hydrocephalus, or masslike finding. Vascular: See below. Skull: Negative Sinuses/Orbits: Negative Other: Negative ASPECTS ([place] Stroke Program Early CT Score) Not scored given the degree of chronic change. CTA NECK FINDINGS Aortic arch: Atherosclerotic calcification.  Three vessel branching. Right carotid system: Moderate bifurcation atheromatous plaque at the common carotid and bifurcation without ulceration or flow limiting stenosis. Negative for beading. Left carotid system: Mild-to-moderate atheromatous plaque at the common carotid bifurcation without flow limiting stenosis or ulceration. Vertebral arteries: No proximal subclavian flow limiting stenosis. Codominant vertebral arteries. No vertebral stenosis or irregularity. Skeleton: Mid right clavicle fracture that is unhealed/nonunited. Other neck: There is a 2 cm ovoid mass with possible polar artery at the central thoracic inlet. Thyroidectomy. There is thyroid ultrasound in 2015 showing goiter. Upper chest: Centrilobular emphysema. Review of the MIP images confirms the above findings CTA HEAD FINDINGS Anterior circulation:  Carotid siphon atherosclerosis without flow limiting stenosis or irregularity. Short segment occlusionof the left distal M1 segment just beyond the anterior temporal branch. Left M2 branches are opacified but appear under filled compared to the right. Atherosclerotic irregularity of right M1 and M2 branches. Negative for aneurysm. Posterior circulation: Vertebral and basilar and widely patent arteries are smooth. Hypoplastic left P1 segment. Moderate to advanced atheromatous narrowing of the distal right P2 segment. Venous sinuses: Patent as permitted by contrast timing. Anatomic variants: As above Delayed phase: Not obtained in the emergent setting  Review of the MIP images confirms the above findings CT Brain Perfusion Findings: Perfusion maps have not yet reached PACS at time of signing, but rapid report was reviewed elsewhere. CBF (<30%) Volume: 35mL Perfusion (Tmax>6.0s) volume: 54mL These results were called by telephone at the time of interpretation on 09/01/2017 at 10:56 am to Dr. Amie Portland , who verbally acknowledged these results. IMPRESSION: 1. Distal left M1 short segment occlusion with opacified but under filled left M2 branches. This occlusion is age-indeterminate. No infarct by cerebral perfusion but there is 18cc penumbra/ischemia. 2. No flow limiting stenosis or ulceration in the neck. The patient is wearing an implanted loop recorder. 3. Multifocal remote MCA distribution infarcts. 4. Age-indeterminate left thalamus lacunar infarct. 5. Moderate to advanced right distal P2 segment stenosis. 6. Aortic Atherosclerosis (ICD10-I70.0) and Emphysema (ICD10-J43.9). 7. Unhealed right mid clavicle fracture with surrounding hemorrhage, favored subacute. 8. 2 cm nodule at the thoracic inlet that is high-density or enhancing. Question polar vessel. 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 Electronically Signed   By: Monte Fantasia M.D.   On:  09/01/2017 11:06    ASSESSMENT / PLAN:  72yo female with hx COPD and previous stroke admitted 1/24 with acute stroke c/b HTN and bleeding at multiple fracture sites after recent fall.   L MCA stroke  S/p tPA AMS  PLAN -  Per stroke team  BP management as below  Repeat imaging without acute hemorrhage after TPA but may need to image again this evening given worsening mental status after initial improvement and concern for other bleeding   HTN  PLAN -  Continue cardene gtt   coagulopathy s/p TPA PLAN -  Hematomas marked - monitor closely  Ortho to see for fractures   ?aspiration r/t nausea/vomiting  Hx COPD  PLAN -  F/u CXR  Supplemental O2 as needed - wears 2L [place] qhs at baseline  Empiric abx for now  PRN BD   Georgann Housekeeper, AGACNP-BC [facility] Pulmonology/Critical Care Pager [phone] or [phone]  09/02/2017 8:10 AM

## 2017-09-01 NOTE — H&P (Addendum)
Stroke Neurology H&P  CC: Right-sided weakness, speech difficulty  History is obtained from: Initially from EMS, later on by the son and husband who arrived by private vehicle.  HPI: Caroline Blake is a 72 y.o. 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 According to the husband, she went to bed at 9:30 PM last night, woke up this morning around 9, had wet her diaper more than usual, he sat her up and change her diaper and had a intelligent conversation with her regarding her diaper overflowing.  Later on after a few minutes, she called her husband by name, when he came by at that time, his speech made no sense.  She also appeared to have a right facial droop.  EMS was called.  They evaluated the patient and activated a LVO positive code stroke and brought her to the ER.  All initial history was by EMS report and the last seen well was way outside the IV TPA window.  Husband was en route driving in a car and there was delay in reaching him over the phone, which is when we got the history that her last known normal laxity was 9:30 AM today and not 9:30 PM last night. Husband to the family, patient at baseline is oriented only to self at most times.  She is not oriented to place or time.  She is able to feed herself.  She bathes herself.  She is able to use the toilet by herself but does require help with all those activities off and on.  She is able to walk without a walker but uses a walker for balance according to the husband. She recently had a fall and broke her right ankle/foot and also injured her right shoulder over the clavicle.  She had been seen  at [facility] and a surgery for her injured right leg has been planned for later as there was way too much swelling to operate at the time of presentation. 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 in [place].  Unfortunately, this is patient's first visit to our healthcare system and outside records were not able for review at the time of this encounter.   LKW: 9:30 AM on 09/01/2017 tpa given?:  Yes-after a delay because of incorrect last known normal provided by EMS and later corrected by family. Premorbid modified Rankin scale (mRS):3  ROS:  Unable to obtain due to altered mental status.   Past Medical History:  Diagnosis Date  . COPD (chronic obstructive pulmonary disease) ([place])   . Stroke [facility])    FAMILY HISTORY No family history on file.   Social History:  Heavy tobacco abuse, denies illicit drug use or alcohol use.   Medications  Current Facility-Administered Medications:  .  alteplase (ACTIVASE) 1 mg/mL infusion 63 mg, 0.9 mg/kg, Intravenous, Once, Amie Portland, MD, Last Rate: 63 mL/hr at 09/01/17 1105, 63 mg at 09/01/17 1105 No current outpatient medications on file.  Exam: Current vital signs: BP (!) 146/86   Pulse 69  Temp (!) 97.4 F (36.3 C) (Temporal)   Resp 12   Ht 5\' 6"  (1.676 m)   Wt 70.4 kg (155 lb 3.3 oz)   SpO2 99%   BMI 25.05 kg/m   Vital signs in last 24 hours: Temp:  [97.4 F (36.3 C)] 97.4 F (36.3 C) (01/24 1101) Pulse Rate:  [69-77] 69 (01/24 1145) Resp:  [10-14] 12 (01/24 1145) BP: (146-166)/(77-88) 146/86 (01/24 1145) SpO2:  [95 %-100 %] 99 % (01/24 1145) Weight:  [70.4 kg (155 lb 3.3 oz)] 70.4 kg (155 lb 3.3 oz) (01/24 1109) General: Patient is awake, alert, did not appear in distress HEENT: Normocephalic atraumatic dry mucous membranes Lungs: Clear to auscultation Cardiovascular: S1-S2 heard regular rate rhythm Abdomen:  Soft nondistended nontender Skin/muscular skeletal-large bruise over the right shoulder.  Right leg in bandage. Neurological exam Mental status: Awake alert, able to tell her name.  Unable to tell me where she was or what month it was. Her speech is dysarthric. Naming is impaired.  She has expressive aphasia, Wernicke's type.  He also has a component of receptive aphasia with the ability only to follow very simple one-step commands and not follow complicated commands. Cranial nerves: Pupils are equal round reactive to light, no restriction on extraocular movements, difficult exam unable to assess with confidence if she has any field cut although does not look like she did based on the fact on her responses to movements in the peripheral fields, right lower face weakness appreciated, shoulder shrug intact and tongue is midline. Motor exam: 2 out of 5 right upper extremity on initial exam, 3/5 right lower extremity, both right upper and lower extremity had vertical drift with the right upper extremity hitting the bed before 10 seconds and right lower extremity not hitting the bed for 5 seconds. Left upper extremity and lower extremity are 4+/5 with no vertical drift. Sensory: Seems to be neglecting the right side, possibly diminished sensation on the right Coordination: Did not cooperate Gait was not tested DTRs: Hyporeflexic all over  NIH stroke scale 1a Level of Conscious.: 0 1b LOC Questions: 2 1c LOC Commands: 1 2 Best Gaze: 0 3 Visual: 1 4 Facial Palsy: 2 5a Motor Arm - left: 0 5b Motor Arm - Right: 3 6a Motor Leg - Left: 0 6b Motor Leg - Right: 1 7 Limb Ataxia: 0 8 Sensory: 2 9 Best Language: 2 10 Dysarthria: 1 11 Extinct. and Inatten.: 2 TOTAL: 17  Labs I have reviewed labs in epic and the results pertinent to this consultation are:  CBC    Component Value Date/Time   WBC 10.4 09/01/2017 1051   RBC 4.18 09/01/2017 1051   HGB 11.2 (L) 09/01/2017 1104   HCT 33.0 (L)  09/01/2017 1104   PLT 211 09/01/2017 1051   MCV 80.6 09/01/2017 1051   MCH 25.4 (L) 09/01/2017 1051   MCHC 31.5 09/01/2017 1051   RDW 16.5 (H) 09/01/2017 1051   LYMPHSABS 0.8 09/01/2017 1051   MONOABS 0.8 09/01/2017 1051   EOSABS 0.1 09/01/2017 1051   BASOSABS 0.0 09/01/2017 1051    CMP     Component Value Date/Time   NA 138 09/01/2017 1104   K 3.7 09/01/2017 1104   CL 106 09/01/2017 1104   CO2 16 (L) 09/01/2017 1051   GLUCOSE 121 (H) 09/01/2017 1104   BUN 60 (H) 09/01/2017 1104   CREATININE 2.20 (H) 09/01/2017 1104   CALCIUM 6.0 (LL) 09/01/2017 1051   PROT 4.9 (L) 09/01/2017 1051  ALBUMIN 2.5 (L) 09/01/2017 1051   AST 15 09/01/2017 1051   ALT 10 (L) 09/01/2017 1051   ALKPHOS 85 09/01/2017 1051   BILITOT 0.5 09/01/2017 1051   GFRNONAA 21 (L) 09/01/2017 1051   GFRAA 25 (L) 09/01/2017 1051   Imaging I have reviewed the images obtained:  CT-scan of the brain -noncontrast CT of the head shows large areas of encephalomalacia in the right MCA territory and small area of encephalomalacia in the left basal ganglia and external capsule.  Generalized atrophy disproportionate to her age is also visualized.  No dense vessels. CT angiogram of the head and neck showed left M1 occlusion with some distal reconstitution.  No occlusions of large vessels in the neck.  CT perfusion shows a small ischemic penumbra measuring 16 cc in the left MCA territory  Assessment:  72 year old woman with a past medical history of TIAs, cognitive deficits, COPD tobacco abuse hypertension not very compliant with medications or doctor visits, presents for evaluation of acute onset of right-sided weakness and aphasia. Exam is consistent with a left MCA syndrome. Initially told that she was outside the window because of last seen normal last night but later information reviewed last seen normal at 9:30 AM. Risks benefits of TPA discussed with the son at bedside.  IV TPA administered at that time. CT Joetta Manners findings also discussed with the endovascular specialist and deemed that because of her poor baseline functional status and a modified Rankin's scale of 3, she is not a candidate for thrombectomy.  This was discussed in detail with the family and all their questions were answered.  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 -Hyperglycemia management per SSI to maintain glucose 140-180mg /dL. -PT/OT/ST therapies and recommendations when able  CNS Close neuro monitoring Dysphagia and dysarthria following cerebral infarction -Speech therapy -N.p.o.  Agitation -Use one-time dose of Ativan 0.5 mg IV -Consider using Seroquel if absolutely needed.  Ideally, I would avoid using any sedating medications.  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  CV Essential (primary) hypertension -Blood pressure management goals as above -Obtain transthoracic echo  HEME Iron Deficiency Anemia -Monitor -transfuse for hgb < 7 -Trend PT/PTT/INR  ENDO -goal HgbA1c < 7 -FS/SSi per PCCM  GI/GU Cr elevated Unclear baseline. No records -Gentle hydration -avoid nephrotoxic agents Appreciate PCCM cons  Fluid/Electrolyte Disorders  -Replete -Repeat labs  ID Possible Aspiration PNA -CXR -NPO -Monitor and manage per PCCM  Ext Rt clavicle fracture and right foot fracture. Immobilize with splint for now. -Consider ortho consult for clavilce and rt foot fracture -Xrays ordered  Prophylaxis DVT:   SCD GI: NA Bowel: doc/senna   Diet: NPO until cleared by speech  Code Status: Full Code   DELAYS IN THE PROCESS: Major delay in administering TPA due to incorrect time of last known normal provided by EMS, but still within under 4 and half hours from symptom onset.   -- Amie Portland, MD [facility] Pager: [phone] If 7pm to 7am, please call on call as listed on AMION.  CRITICAL CARE ATTESTATION This patient is critically ill and  at significant risk of neurological worsening, death and care requires constant monitoring of vital signs, hemodynamics,respiratory and cardiac monitoring. I spent 60  minutes of neurocritical care time performing neurological assessment, discussion with family, other specialists and medical decision making of high complexityin the care of  this patient.

## 2017-09-01 NOTE — ED Notes (Signed)
Patient transported to CT 

## 2017-09-02 ENCOUNTER — Inpatient Hospital Stay (HOSPITAL_COMMUNITY): Payer: Medicare Other

## 2017-09-02 DIAGNOSIS — G9340 Encephalopathy, unspecified: Secondary | ICD-10-CM

## 2017-09-02 DIAGNOSIS — J9690 Respiratory failure, unspecified, unspecified whether with hypoxia or hypercapnia: Secondary | ICD-10-CM

## 2017-09-02 DIAGNOSIS — J9691 Respiratory failure, unspecified with hypoxia: Secondary | ICD-10-CM

## 2017-09-02 DIAGNOSIS — I639 Cerebral infarction, unspecified: Secondary | ICD-10-CM

## 2017-09-02 DIAGNOSIS — Z515 Encounter for palliative care: Secondary | ICD-10-CM

## 2017-09-02 DIAGNOSIS — J9601 Acute respiratory failure with hypoxia: Secondary | ICD-10-CM

## 2017-09-02 DIAGNOSIS — Z7189 Other specified counseling: Secondary | ICD-10-CM

## 2017-09-02 LAB — MAGNESIUM: MAGNESIUM: 1.7 mg/dL (ref 1.7–2.4)

## 2017-09-02 LAB — ECHOCARDIOGRAM COMPLETE
E decel time: 277 msec
E/e' ratio: 11.39
FS: 30 % (ref 28–44)
HEIGHTINCHES: 64 in
IVS/LV PW RATIO, ED: 0.8
LA ID, A-P, ES: 34 mm
LA diam end sys: 34 mm
LA vol index: 24.4 mL/m2
LADIAMINDEX: 1.99 cm/m2
LAVOL: 41.8 mL
LAVOLA4C: 34.5 mL
LDCA: 2.27 cm2
LV E/e'average: 11.39
LV PW d: 11.2 mm — AB (ref 0.6–1.1)
LV TDI E'MEDIAL: 5.63
LVEEMED: 11.39
LVELAT: 7.6 cm/s
LVOT VTI: 29 cm
LVOT diameter: 17 mm
LVOT peak grad rest: 8 mmHg
LVOT peak vel: 143 cm/s
LVOTSV: 66 mL
MV Dec: 277
MV pk E vel: 86.6 m/s
MVPG: 3 mmHg
MVPKAVEL: 147 m/s
RV LATERAL S' VELOCITY: 11.3 cm/s
RV TAPSE: 22.4 mm
TDI e' lateral: 7.6
Weight: 2328.06 oz

## 2017-09-02 LAB — LIPID PANEL
CHOLESTEROL: 156 mg/dL (ref 0–200)
HDL: 47 mg/dL (ref >40)
LDL Cholesterol: 101 mg/dL — ABNORMAL HIGH (ref 0–99)
TRIGLYCERIDES: 41 mg/dL (ref <150)
Total CHOL/HDL Ratio: 3.3 RATIO
VLDL: 8 mg/dL (ref 0–40)

## 2017-09-02 LAB — PHOSPHORUS: Phosphorus: 6.6 mg/dL — ABNORMAL HIGH (ref 2.5–4.6)

## 2017-09-02 LAB — MRSA PCR SCREENING: MRSA by PCR: NEGATIVE

## 2017-09-02 LAB — GLUCOSE, CAPILLARY
Glucose-Capillary: 111 mg/dL — ABNORMAL HIGH (ref 65–99)
Glucose-Capillary: 98 mg/dL (ref 65–99)
Glucose-Capillary: 118 mg/dL — ABNORMAL HIGH (ref 65–99)
Glucose-Capillary: 135 mg/dL — ABNORMAL HIGH (ref 65–99)

## 2017-09-02 LAB — HEMOGLOBIN A1C
HEMOGLOBIN A1C: 6.5 % — AB (ref 4.8–5.6)
MEAN PLASMA GLUCOSE: 139.85 mg/dL

## 2017-09-02 MED ORDER — SODIUM CHLORIDE 0.9 % IV SOLN
1.0000 g | Freq: Once | INTRAVENOUS | Status: AC
  Administered 2017-09-02: 1 g via INTRAVENOUS
  Filled 2017-09-02: qty 10

## 2017-09-02 MED ORDER — LORAZEPAM 2 MG/ML IJ SOLN
0.5000 mg | Freq: Once | INTRAMUSCULAR | Status: AC
  Administered 2017-09-02: 0.5 mg via INTRAVENOUS
  Filled 2017-09-02: qty 1

## 2017-09-02 NOTE — Evaluation (Signed)
Speech Language Pathology Evaluation Patient Details Name: Caroline Blake MRN: [id number] DOB: [birth year] Today's Date: 09/02/2017 Time: 4098-1191 SLP Time Calculation (min) (ACUTE ONLY): 33 min  Problem List:  Patient Active Problem List   Diagnosis Date Noted  . Cerebral infarction ([place])   . Respiratory failure ([place])   . Goals of care, counseling/discussion   . Palliative care by specialist   . Stroke (cerebrum) ([place]) 09/01/2017   Past Medical History:  Past Medical History:  Diagnosis Date  . COPD (chronic obstructive pulmonary disease) ([place])   . Stroke [facility])    Past Surgical History: The histories are not reviewed yet. Please review them in the "History" navigator section and refresh this [place]. HPI:  Pt is 72 year old woman admitted 09/01/17 with aphasia and R side weakness.+ acute L frontotemporal lobe infarct, s/p tpa. PMH: COPD, current smoker, HTN, CVA, dementia, and multiple fxs. CXR (1/25) revealed mild cardiomegaly, mild bilateral interstitial prominence noted (possibley chronic). Pt failed stroke swallow screen requiring SLP BSE.   Assessment / Plan / Recommendation Clinical Impression  Pt presents with an expressive > receptive aphasia with verbal expression fluent but comprised of many phonemic and semantic paraphasias. Pt seems unaware of her linguistic errors. She needed Mod cues to follow one-step commands, but I also question how much her hearing is impacting her performance. Pt will benefit from SLP f/u to maximize functional communication and address additional differential diagnosis of swallowing abilities.    SLP Assessment  SLP Recommendation/Assessment: Patient needs continued Speech Lanaguage Pathology Services SLP Visit Diagnosis: Aphasia (R47.01)    Follow Up Recommendations  Skilled Nursing facility    Frequency and Duration min 2x/week  2 weeks      SLP Evaluation Cognition  Overall Cognitive Status: Difficult to  assess(aphasia) Arousal/Alertness: Awake/alert Orientation Level: Oriented to person;Oriented to place Attention: Sustained Sustained Attention: Appears intact(with basic, functional tasks) Awareness: Impaired Awareness Impairment: Emergent impairment Safety/Judgment: Impaired       Comprehension  Auditory Comprehension Overall Auditory Comprehension: Impaired Yes/No Questions: Impaired Basic Immediate Environment Questions: 75-100% accurate Complex Questions: 25-49% accurate Commands: Impaired One Step Basic Commands: 50-74% accurate Conversation: Simple Interfering Components: Hearing EffectiveTechniques: Repetition;Stressing words;Visual/Gestural cues;Other (Comment)(louder volume) Visual Recognition/Discrimination Discrimination: Not tested Reading Comprehension Reading Status: Not tested    Expression Expression Primary Mode of Expression: Verbal Verbal Expression Overall Verbal Expression: Impaired Initiation: No impairment Automatic Speech: Name Level of Generative/Spontaneous Verbalization: Phrase Repetition: No impairment(at word level) Naming: Impairment Confrontation: Impaired Verbal Errors: Semantic paraphasias;Phonemic paraphasias;Not aware of errors Pragmatics: No impairment Effective Techniques: Other (Comment)(repetition) Non-Verbal Means of Communication: Not applicable Written Expression Dominant Hand: Right   Oral / Motor  Oral Motor/Sensory Function Overall Oral Motor/Sensory Function: Mild impairment Facial ROM: Reduced right Facial Symmetry: Abnormal symmetry right Facial Strength: Within Functional Limits Lingual ROM: Within Functional Limits Lingual Symmetry: Within Functional Limits Lingual Sensation: Within Functional Limits Motor Speech Overall Motor Speech: Impaired Respiration: Within functional limits Phonation: Normal Resonance: Within functional limits Articulation: Impaired Level of Impairment: Phrase Intelligibility:  Intelligible Interfering Components: Hearing loss   GO                    Germain Osgood 09/02/2017, 5:11 PM  Germain Osgood, M.A. CCC-SLP [phone]

## 2017-09-02 NOTE — Progress Notes (Signed)
NEUROHOSPITALISTS STROKE TEAM - DAILY PROGRESS NOTE   ADMISSION HISTORY:  Caroline Blake is a 72 y.o. 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 According to the husband, she went to bed at 9:30 PM last night, woke up this morning around 9, had wet her diaper more than usual, he sat her up and change her diaper and had a intelligent conversation with her regarding her diaper overflowing.  Later on after a few minutes, she called her husband by name, when he came by at that time, his speech made no sense.  She also appeared to have a right facial droop.  EMS was called.  They evaluated the patient and activated a LVO positive code stroke and brought her to the ER.  All initial history was by EMS report and the last seen well was way outside the IV TPA window.  Husband was en route driving in a car and there was delay in reaching him over the phone, which is when we got the history that her last known normal laxity was 9:30 AM today and not 9:30 PM last night.   Husband to the family, patient at baseline is oriented only to self at most times.  She is not oriented to place or time.  She is able to feed herself.  She bathes herself.  She is able to use the toilet by herself but does require help with all those activities off and on.  She is able to walk without a walker but uses a walker for balance according to the husband.   She recently had a fall and broke her right ankle/foot and also injured her right shoulder over the clavicle.  She had been seen at [facility] and a surgery for her  injured right leg has been planned for later as there was way too much swelling to operate at the time of presentation.   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  Unfortunately, this is patient's first visit to our healthcare system and outside records were not able for review at the time of this encounter.  LKW: 9:30 AM on 09/01/2017 tpa given?:  Yes-after a delay because of incorrect last known normal provided by EMS and later corrected by family. Premorbid modified Rankin scale (mRS):3 NIH stroke scale 1a Level of Conscious.: 0 1b LOC Questions: 2 1c LOC Commands: 1 2 Best Gaze: 0 3 Visual: 1 4 Facial Palsy: 2 5a Motor Arm - left: 0 5b Motor Arm - Right: 3 6a Motor Leg - Left: 0 6b Motor Leg - Right: 1 7 Limb Ataxia: 0 8 Sensory: 2 9 Best Language: 2 10 Dysarthria: 1 11 Extinct. and Inatten.: 2 TOTAL: 17  SUBJECTIVE (INTERVAL HISTORY)  Family is at the bedside. Patient is found laying in bed in NAD, she is agitated. Family at bedside.     OBJECTIVE Lab Results: CBC:  Recent Labs  Lab 09/01/17 1035 09/01/17 1051 09/01/17 1104  WBC  --  10.4  --  HGB 13.3 10.6* 11.2*  HCT 39.0 33.7* 33.0*  MCV  --  80.6  --   PLT  --  211  --    BMP: Recent Labs  Lab 09/01/17 1035 09/01/17 1051 09/01/17 1104  NA 141 135 138  K 4.0 3.7 3.7  CL 110 108 106  CO2  --  16*  --   GLUCOSE 128* 126* 121*  BUN 67* 71* 60*  CREATININE 2.40* 2.21* 2.20*  CALCIUM  --  6.0*  --    Liver Function Tests:  Recent Labs  Lab 09/01/17 1051  AST 15  ALT 10*  ALKPHOS 85  BILITOT 0.5  PROT 4.9*  ALBUMIN 2.5*   Coagulation Studies:  Recent Labs    09/01/17 1051  APTT 28  INR 1.29   Urinalysis:  Recent Labs  Lab 09/01/17 [place]  LABSPEC 1.020  PHURINE 5.0  GLUCOSEU NEGATIVE  HGBUR SMALL*  BILIRUBINUR NEGATIVE  KETONESUR NEGATIVE   PROTEINUR NEGATIVE  NITRITE NEGATIVE  LEUKOCYTESUR NEGATIVE   Urine Drug Screen:     Component Value Date/Time   LABOPIA NONE DETECTED 09/01/2017 1123   COCAINSCRNUR NONE DETECTED 09/01/2017 1123   LABBENZ NONE DETECTED 09/01/2017 1123   AMPHETMU NONE DETECTED 09/01/2017 1123   THCU NONE DETECTED 09/01/2017 1123   LABBARB NONE DETECTED 09/01/2017 1123    Alcohol Level:  Recent Labs  Lab 09/01/17 1015 09/01/17 1051  ETH <10 <10   PHYSICAL EXAM Temp:  [97.4 F (36.3 C)-98.2 F (36.8 C)] 97.4 F (36.3 C) (01/25 0726) Pulse Rate:  [33-117] 70 (01/25 1000) Resp:  [10-30] 12 (01/25 1000) BP: (97-183)/(43-99) 107/57 (01/25 1000) SpO2:  [87 %-100 %] 99 % (01/25 1000) Weight:  [145 lb 8.1 oz (66 kg)-155 lb 3.3 oz (70.4 kg)] 145 lb 8.1 oz (66 kg) (01/24 2145) General - Slightly agitated HEENT-  Normocephalic,  Cardiovascular - Regular rate and rhythm  Respiratory - Lungs clear bilaterally. No wheezing. Abdomen - soft and non-tender, BS normal Skin/muscular skeletal-large bruise over the right shoulder.  Right leg in bandage. Neurological exam Mental status: unable to answer questions, agitated and uncomfortable Averbal . Motor exam: cannot perform full exam, moving all limbs however right arm and leg less than left arm and leg  Sensory: Seems to be neglecting the right side, possibly diminished sensation on the right Coordination: Did not cooperate Gait was not tested DTRs: Hyporeflexic all over  IMAGING: I have personally reviewed the radiological images below and agree with the radiology interpretations.  CT Head Code Stroke & Ct Angio Head and Neck  And CT Perfusion W Or Wo Contrast Result Date: 09/01/2017 IMPRESSION: 1. Distal left M1 short segment occlusion with opacified but under filled left M2 branches. This occlusion is age-indeterminate. No infarct by cerebral perfusion but there is 18cc penumbra/ischemia. 2. No flow limiting stenosis or ulceration in the neck.  The patient is wearing an implanted loop recorder. 3. Multifocal remote MCA distribution infarcts. 4. Age-indeterminate left thalamus lacunar infarct. 5. Moderate to advanced right distal P2 segment stenosis. 6. Aortic Atherosclerosis (ICD10-I70.0) and Emphysema (ICD10-J43.9). 7. Unhealed right mid clavicle fracture with surrounding hemorrhage, favored subacute. 8. 2 cm nodule at the thoracic inlet that is high-density or enhancing. Question polar vessel. 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 Electronically Signed   By: Monte Fantasia M.D.   On: 09/01/2017 11:06   Ct Head Wo Contrast Result Date: 09/01/2017  IMPRESSION: No acute hemorrhage following tPA. No change from head CT earlier today. Chronic ischemic changes bilaterally. Electronically Signed   By: Franchot Gallo M.D.   On: 09/01/2017 12:24   Echocardiogram:                                              PENDING MRI Head:                                                          PENDING (was too agitated to complete) CT Head:                                                            PENDING    IMPRESSION: Ms. TOMICKA LOVER is a 72 y.o. female with PMH of TIAs, cognitive deficits, COPD tobacco abuse hypertension not very compliant with medications or doctor visits, presents for evaluation of acute onset of right-sided weakness and aphasia. Exam is consistent with a left MCA syndrome.  IV TPA administered, not an endovascular candidate for thrombectomy. DELAYS IN THE PROCESS: Major delay in administering TPA due to incorrect time of last known normal provided by EMS, but still within under 4 and half hours from symptom onset.     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 Resultant Symptoms: right-sided weakness and aphasia, agitation Stroke Risk Factors: hypertension and  smoking Other Stroke Risk Factors: Advanced age, Cigarette smoker, Hx stroke/TIA  Outstanding Stroke Work-up Studies:     Echocardiogram:                                                    PENDING MRI Head:                                                               PENDING CT Head:                                                                PENDING  PLAN  09/02/2017: Continue Statin and Zetia prior to discharge HOLD ASA until 24 hour post tPA neuroimaging is stable & without evidence of bleeding Frequent neuro checks Telemetry monitoring PT/OT/SLP Consult PM & Rehab Consult Case Management /MSW Has TEE and Loop, requested interrogation Ongoing aggressive stroke risk factor management Patient will be counseled to be compliant  with her antithrombotic medications Patient will be counseled on Lifestyle modifications including, Diet, Exercise, and Stress Follow up with [facility] in 6 weeks  HX OF STROKES: remote MCA distribution infarcts  MEDICAL ISSUES:  CNS Close neuro monitoring  Agitation -Use one-time dose of Ativan 0.5 mg IV -Consider using Seroquel if absolutely needed.   -Avoid using any sedating medications, if possible  RESP -No acute issues for now -Protecting her airway -Consulted pulmonary critical care medicine for help with medical management.  CV HYPERTENSION: Stable SBP goal of < 180. DBP goal of < 105.  Nicardipine drip, Labetolol PRN Long term BP goal normotensive. May slowly restart home B/P medications after 48 hours Home Meds: Norvasc, Lopressor, Inderal  R/O AFIB: Loop Recorder interrogation in progress  HEME Iron Deficiency Anemia -Monitor -transfuse for hgb < 7 -Trend PT/PTT/INR  GI/GU Cr elevated Unclear baseline. No records -Gentle hydration -avoid nephrotoxic agents Appreciate PCCM cons  DYSPHAGIA: NPO until passes SLP swallow evaluation Aspiration Precautions in progress  Fluid/Electrolyte  Disorders  -Replete Calcium - in progress -Repeat labs,Ionized calcium in AM  Ext Rt clavicle fracture and Right foot fracture. Immobilize with splint for now. -Ortho consulted for clavicle and Right foot fracture -Xrays ordered  8. 2 cm nodule at the thoracic inlet Outpatient ENT/Endocrine surveillance   HYPERLIPIDEMIA:    Component Value Date/Time   CHOL 156 09/02/2017 0212   TRIG 41 09/02/2017 0212   HDL 47 09/02/2017 0212   CHOLHDL 3.3 09/02/2017 0212   VLDL 8 09/02/2017 0212   LDLCALC 101 (H) 09/02/2017 0212  Home Meds:  Pravachol 80mg  LDL  goal < 70 Cannot change statin to  Lipitor to 80 mg due to statin allergy Will continue Pravachol 80 mg at discharge and Zetia 10 mg   PRE-DIABETES: Lab Results  Component Value Date   HGBA1C 6.5 (H) 09/02/2017  HgbA1c goal < 7.0 Currently on: Novolog Continue CBG monitoring and SSI to maintain glucose 140-180 mg/dl DM education   TOBACCO ABUSE  Current smoker Smoking cessation counseling provided Nicotine patch provided  Other Active Problems: Active Problems:   Stroke (cerebrum) [facility])    Hospital day # 1 VTE prophylaxis: SCD's  Diet : Diet NPO time specified Check puncture sites for bleeding or hematomas. Bleeding precautions Fall precautions   FAMILY UPDATES: No family at bedside  TEAM UPDATES: Melvenia Beam, MD   Prior Home Stroke Medications:  clopidogrel 75 mg daily  Discharge Stroke Meds:  Please discharge patient on TBD   Disposition: 01-Home or Self Care Therapy Recs:               PENDING Follow Up:  Follow-up Information    [facility]. Schedule an appointment as soon as possible for a visit in 6 week(s).   Specialty:  Neurology Contact information: [place] [address] [phone]          Drake Leach, MD -PCP Follow up in 1-2 weeks       Attending Note:  09/02/2017 ASSESSMENT:    This patient is critically ill and at significant risk of neurological  worsening, death and care requires constant monitoring of vital signs, hemodynamics,respiratory and cardiac monitoring,review of multiple databases, neurological assessment, discussion with family, other specialists and medical decision making of high complexity.I  I spent 30 minutes of neurocritical care time in the care of this patient.  Sarina Ill, MD Stroke Neurology Team 09/02/2017 10:54 AM   To contact Stroke Continuity provider,  please refer to [email or web address] After hours, contact General Neurology

## 2017-09-02 NOTE — Progress Notes (Signed)
Came to room to evaluate pt to come off cpap.  Pt still sleeping w/ mask on side of face and noted pt snoring.  Replaced mask over face and tightened mask to maintain seal.  No distress noted, VSS, sat 97%.  Pt appears to be resting comfortably currently.

## 2017-09-02 NOTE — Consult Note (Signed)
Reason for Consult:Clavicle/foot fxs Referring Physician: A Jessieca Rhem is an 72 y.o. female.  HPI: Caroline Blake was admitted with a CVA on 1/24. Prior to this she had suffered several falls. On 1/6 she had multiple foot fxs (2-4 MT base, 2nd MT neck, and cuneiform x3). She was splinted and reportedly surgery was planned in a delayed manner to allow time for the swelling to improve. On 1/20 she had another fall and had a right clavicle fracture. These were diagnosed and treated at [facility]. 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 History gleaned from chart.  Past Medical History:  Diagnosis Date  . COPD (chronic obstructive pulmonary disease) ([place])   . Stroke [facility])     No family history on file.  Social History:  has no tobacco, alcohol, and drug history on file.  Allergies:  Allergies  Allergen Reactions  . Atorvastatin Itching and Other (See Comments)    Myopathy   . Carvedilol Itching  . Cinoxacin Itching and Other (See Comments)    hypotension   . Etodolac Other (See Comments), Itching and Hives  . Metoclopramide Itching  . Moxifloxacin Itching  . Paroxetine Hcl Other (See Comments)    Unknown reaction  . Rosuvastatin Calcium Other (See Comments)    Myalgias  . Nitrofurantoin Other (See Comments)    Yeast infection  . Rivaroxaban Other (See Comments)    GI bleed  . Sulfa Antibiotics Hives and Itching  . Yellow Dyes (Non-Tartrazine) Other (See Comments)    unspecified  . Aspirin-Dipyridamole Er Itching  . Pitavastatin Itching    Medications: I have reviewed the patient's current medications.  Results for orders placed or performed during the hospital encounter of 09/01/17 (from the past 48 hour(s))  Ethanol     Status: None   Collection Time: 09/01/17 10:51 AM  Result Value Ref Range   Alcohol, Ethyl (B) <10 <10 mg/dL    Comment:        LOWEST DETECTABLE LIMIT FOR SERUM ALCOHOL IS 10 mg/dL FOR MEDICAL PURPOSES ONLY   Protime-INR     Status: Abnormal    Collection Time: 09/01/17 10:51 AM  Result Value Ref Range   Prothrombin Time 16.0 (H) 11.4 - 15.2 seconds   INR 1.29   APTT     Status: None   Collection Time: 09/01/17 10:51 AM  Result Value Ref Range   aPTT 28 24 - 36 seconds  CBC     Status: Abnormal   Collection Time: 09/01/17 10:51 AM  Result Value Ref Range   WBC 10.4 4.0 - 10.5 K/uL   RBC 4.18 3.87 - 5.11 MIL/uL   Hemoglobin 10.6 (L) 12.0 - 15.0 g/dL   HCT 33.7 (L) 36.0 - 46.0 %   MCV 80.6 78.0 - 100.0 fL   MCH 25.4 (L) 26.0 - 34.0 pg   MCHC 31.5 30.0 - 36.0 g/dL   RDW 16.5 (H) 11.5 - 15.5 %   Platelets 211 150 - 400 K/uL  Differential     Status: Abnormal   Collection Time: 09/01/17 10:51 AM  Result Value Ref Range   Neutrophils Relative % 85 %   Neutro Abs 8.8 (H) 1.7 - 7.7 K/uL   Lymphocytes Relative 7 %   Lymphs Abs 0.8 0.7 - 4.0 K/uL   Monocytes Relative 7 %   Monocytes Absolute 0.8 0.1 - 1.0 K/uL   Eosinophils Relative 1 %   Eosinophils Absolute 0.1 0.0 - 0.7 K/uL   Basophils Relative 0 %  Basophils Absolute 0.0 0.0 - 0.1 K/uL  Comprehensive metabolic panel     Status: Abnormal   Collection Time: 09/01/17 10:51 AM  Result Value Ref Range   Sodium 135 135 - 145 mmol/L   Potassium 3.7 3.5 - 5.1 mmol/L   Chloride 108 101 - 111 mmol/L   CO2 16 (L) 22 - 32 mmol/L   Glucose, Bld 126 (H) 65 - 99 mg/dL   BUN 71 (H) 6 - 20 mg/dL   Creatinine, Ser 2.21 (H) 0.44 - 1.00 mg/dL   Calcium 6.0 (LL) 8.9 - 10.3 mg/dL    Comment: CRITICAL RESULT CALLED TO, READ BACK BY AND VERIFIED WITH: VANHOSE,C RN [id number] 09/01/2017 BY A BENNETT    Total Protein 4.9 (L) 6.5 - 8.1 g/dL   Albumin 2.5 (L) 3.5 - 5.0 g/dL   AST 15 15 - 41 U/L   ALT 10 (L) 14 - 54 U/L   Alkaline Phosphatase 85 38 - 126 U/L   Total Bilirubin 0.5 0.3 - 1.2 mg/dL   GFR calc non Af Amer 21 (L) >60 mL/min   GFR calc Af Amer 25 (L) >60 mL/min    Comment: (NOTE) The eGFR has been calculated using the CKD EPI equation. This calculation has not been validated in  all clinical situations. eGFR's persistently <60 mL/min signify possible Chronic Kidney Disease.    Anion gap 11 5 - 15  I-stat troponin, ED     Status: None   Collection Time: 09/01/17 11:02 AM  Result Value Ref Range   Troponin i, poc 0.01 0.00 - 0.08 ng/mL   Comment 3            Comment: Due to the release kinetics of cTnI, a negative result within the first hours of the onset of symptoms does not rule out myocardial infarction with certainty. If myocardial infarction is still suspected, repeat the test at appropriate intervals.   I-Stat Chem 8, ED     Status: Abnormal   Collection Time: 09/01/17 11:04 AM  Result Value Ref Range   Sodium 138 135 - 145 mmol/L   Potassium 3.7 3.5 - 5.1 mmol/L   Chloride 106 101 - 111 mmol/L   BUN 60 (H) 6 - 20 mg/dL   Creatinine, Ser 2.20 (H) 0.44 - 1.00 mg/dL   Glucose, Bld 121 (H) 65 - 99 mg/dL   Calcium, Ion 0.84 (LL) 1.15 - 1.40 mmol/L   TCO2 18 (L) 22 - 32 mmol/L   Hemoglobin 11.2 (L) 12.0 - 15.0 g/dL   HCT 33.0 (L) 36.0 - 46.0 %   Comment NOTIFIED PHYSICIAN   Urine rapid drug screen (hosp performed)     Status: None   Collection Time: 09/01/17 11:23 AM  Result Value Ref Range   Opiates NONE DETECTED NONE DETECTED   Cocaine NONE DETECTED NONE DETECTED   Benzodiazepines NONE DETECTED NONE DETECTED   Amphetamines NONE DETECTED NONE DETECTED   Tetrahydrocannabinol NONE DETECTED NONE DETECTED   Barbiturates NONE DETECTED NONE DETECTED    Comment: (NOTE) DRUG SCREEN FOR MEDICAL PURPOSES ONLY.  IF CONFIRMATION IS NEEDED FOR ANY PURPOSE, NOTIFY LAB WITHIN 5 DAYS. LOWEST DETECTABLE LIMITS FOR URINE DRUG SCREEN Drug Class                     Cutoff (ng/mL) Amphetamine and metabolites    1000 Barbiturate and metabolites    200 Benzodiazepine  562 Tricyclics and metabolites     300 Opiates and metabolites        300 Cocaine and metabolites        300 THC                            50   Urinalysis, Routine w reflex  microscopic     Status: Abnormal   Collection Time: 09/01/17 11:23 AM  Result Value Ref Range   Color, Urine YELLOW YELLOW   APPearance CLEAR CLEAR   Specific Gravity, Urine 1.020 1.005 - 1.030   pH 5.0 5.0 - 8.0   Glucose, UA NEGATIVE NEGATIVE mg/dL   Hgb urine dipstick SMALL (A) NEGATIVE   Bilirubin Urine NEGATIVE NEGATIVE   Ketones, ur NEGATIVE NEGATIVE mg/dL   Protein, ur NEGATIVE NEGATIVE mg/dL   Nitrite NEGATIVE NEGATIVE   Leukocytes, UA NEGATIVE NEGATIVE   RBC / HPF 0-5 0 - 5 RBC/hpf   WBC, UA 0-5 0 - 5 WBC/hpf   Bacteria, UA NONE SEEN NONE SEEN   Squamous Epithelial / LPF NONE SEEN NONE SEEN   Mucus PRESENT   MRSA PCR Screening     Status: None   Collection Time: 09/01/17  9:28 PM  Result Value Ref Range   MRSA by PCR NEGATIVE NEGATIVE    Comment:        The GeneXpert MRSA Assay (FDA approved for NASAL specimens only), is one component of a comprehensive MRSA colonization surveillance program. It is not intended to diagnose MRSA infection nor to guide or monitor treatment for MRSA infections.   Glucose, capillary     Status: Abnormal   Collection Time: 09/01/17  9:49 PM  Result Value Ref Range   Glucose-Capillary 116 (H) 65 - 99 mg/dL   Comment 1 Capillary Specimen    Comment 2 Notify RN   Hemoglobin A1c     Status: Abnormal   Collection Time: 09/02/17  2:12 AM  Result Value Ref Range   Hgb A1c MFr Bld 6.5 (H) 4.8 - 5.6 %    Comment: (NOTE) Pre diabetes:          5.7%-6.4% Diabetes:              >6.4% Glycemic control for   <7.0% adults with diabetes    Mean Plasma Glucose 139.85 mg/dL  Lipid panel     Status: Abnormal   Collection Time: 09/02/17  2:12 AM  Result Value Ref Range   Cholesterol 156 0 - 200 mg/dL   Triglycerides 41 <150 mg/dL   HDL 47 >40 mg/dL   Total CHOL/HDL Ratio 3.3 RATIO   VLDL 8 0 - 40 mg/dL   LDL Cholesterol 101 (H) 0 - 99 mg/dL    Comment:        Total Cholesterol/HDL:CHD Risk Coronary Heart Disease Risk Table                      Men   Women  1/2 Average Risk   3.4   3.3  Average Risk       5.0   4.4  2 X Average Risk   9.6   7.1  3 X Average Risk  23.4   11.0        Use the calculated Patient Ratio above and the CHD Risk Table to determine the patient's CHD Risk.        ATP III CLASSIFICATION (LDL):  <100  mg/dL   Optimal  100-129  mg/dL   Near or Above                    Optimal  130-159  mg/dL   Borderline  160-189  mg/dL   High  >190     mg/dL   Very High   Glucose, capillary     Status: Abnormal   Collection Time: 09/02/17  7:26 AM  Result Value Ref Range   Glucose-Capillary 111 (H) 65 - 99 mg/dL   Comment 1 Capillary Specimen    Comment 2 Notify RN   Glucose, capillary     Status: None   Collection Time: 09/02/17 11:23 AM  Result Value Ref Range   Glucose-Capillary 98 65 - 99 mg/dL   Comment 1 Capillary Specimen    Comment 2 Notify RN     Ct Angio Head W Or Wo Contrast  Result Date: 09/01/2017 CLINICAL DATA:  Code stroke. Weakness and aphasia. Flaccid right side. EXAM: CT ANGIOGRAPHY HEAD AND NECK CT PERFUSION BRAIN TECHNIQUE: Multidetector CT imaging of the head and neck was performed using the standard protocol during bolus administration of intravenous contrast. Multiplanar CT image reconstructions and MIPs were obtained to evaluate the vascular anatomy. Carotid stenosis measurements (when applicable) are obtained utilizing NASCET criteria, using the distal internal carotid diameter as the denominator. Multiphase CT imaging of the brain was performed following IV bolus contrast injection. Subsequent parametric perfusion maps were calculated using RAPID software. CONTRAST:  Reference EMR COMPARISON:  None. FINDINGS: CT HEAD FINDINGS Brain: There is a large remote right MCA territory infarct affecting temporal and posterior frontal lobes. Remote left frontal operculum infarct. Remote perforator infarct with volume loss in the left basal ganglia and deep white matter tracts. Age-indeterminate  lacunar infarct in the left thalamus. Chronic small vessel ischemia. No hemorrhage, hydrocephalus, or masslike finding. Vascular: See below. Skull: Negative Sinuses/Orbits: Negative Other: Negative ASPECTS ([place] Stroke Program Early CT Score) Not scored given the degree of chronic change. CTA NECK FINDINGS Aortic arch: Atherosclerotic calcification.  Three vessel branching. Right carotid system: Moderate bifurcation atheromatous plaque at the common carotid and bifurcation without ulceration or flow limiting stenosis. Negative for beading. Left carotid system: Mild-to-moderate atheromatous plaque at the common carotid bifurcation without flow limiting stenosis or ulceration. Vertebral arteries: No proximal subclavian flow limiting stenosis. Codominant vertebral arteries. No vertebral stenosis or irregularity. Skeleton: Mid right clavicle fracture that is unhealed/nonunited. Other neck: There is a 2 cm ovoid mass with possible polar artery at the central thoracic inlet. Thyroidectomy. There is thyroid ultrasound in 2015 showing goiter. Upper chest: Centrilobular emphysema. Review of the MIP images confirms the above findings CTA HEAD FINDINGS Anterior circulation: Carotid siphon atherosclerosis without flow limiting stenosis or irregularity. Short segment occlusionof the left distal M1 segment just beyond the anterior temporal branch. Left M2 branches are opacified but appear under filled compared to the right. Atherosclerotic irregularity of right M1 and M2 branches. Negative for aneurysm. Posterior circulation: Vertebral and basilar and widely patent arteries are smooth. Hypoplastic left P1 segment. Moderate to advanced atheromatous narrowing of the distal right P2 segment. Venous sinuses: Patent as permitted by contrast timing. Anatomic variants: As above Delayed phase: Not obtained in the emergent setting Review of the MIP images confirms the above findings CT Brain Perfusion Findings: Perfusion maps have not  yet reached PACS at time of signing, but rapid report was reviewed elsewhere. CBF (<30%) Volume: 72m Perfusion (Tmax>6.0s) volume: 120mThese results were  called by telephone at the time of interpretation on 09/01/2017 at 10:56 am to Dr. Amie Portland , who verbally acknowledged these results. IMPRESSION: 1. Distal left M1 short segment occlusion with opacified but under filled left M2 branches. This occlusion is age-indeterminate. No infarct by cerebral perfusion but there is 18cc penumbra/ischemia. 2. No flow limiting stenosis or ulceration in the neck. The patient is wearing an implanted loop recorder. 3. Multifocal remote MCA distribution infarcts. 4. Age-indeterminate left thalamus lacunar infarct. 5. Moderate to advanced right distal P2 segment stenosis. 6. Aortic Atherosclerosis (ICD10-I70.0) and Emphysema (ICD10-J43.9). 7. Unhealed right mid clavicle fracture with surrounding hemorrhage, favored subacute. 8. 2 cm nodule at the thoracic inlet that is high-density or enhancing. Question polar vessel. 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 Electronically Signed   By: Monte Fantasia M.D.   On: 09/01/2017 11:06   Dg Clavicle Right  Result Date: 09/02/2017 CLINICAL DATA:  Upper chest bruising EXAM: RIGHT CLAVICLE - 2+ VIEWS COMPARISON:  None. FINDINGS: Midshaft right clavicular fracture is noted with downward displacement of the distal fracture fragment. Degenerative changes of the acromioclavicular joint are seen. The humeral head is well seated. The underlying bony thorax is within normal limits. IMPRESSION: Midshaft right clavicular fracture Electronically Signed   By: Inez Catalina M.D.   On: 09/02/2017 11:15   Dg Ankle Complete Right  Result Date: 09/02/2017 CLINICAL DATA:  Known foot fractures EXAM: RIGHT ANKLE - COMPLETE 3+ VIEW COMPARISON:  08/14/2017 FINDINGS: Casting material limits fine bony detail. The degree of soft tissue  swelling has reduced somewhat in the interval from the prior exam. Calcaneal spurring is again noted. Chronic changes about the talonavicular joint are again seen and stable. The known tarsal and metatarsal fractures are not well appreciated on this exam. IMPRESSION: No acute abnormality noted. Known foot fractures are not well appreciated on this study. Electronically Signed   By: Inez Catalina M.D.   On: 09/02/2017 11:16   Ct Head Wo Contrast  Result Date: 09/01/2017 CLINICAL DATA:  Stroke.  Post tPA EXAM: CT HEAD WITHOUT CONTRAST TECHNIQUE: Contiguous axial images were obtained from the base of the skull through the vertex without intravenous contrast. COMPARISON:  CT head and CTA head 09/01/2017 FINDINGS: Brain: Negative for acute hemorrhage Chronic infarct right temporoparietal lobe unchanged. Chronic infarct left inferior frontal lobe unchanged. Chronic infarct in the left internal capsule unchanged. Ventricle size normal. No midline shift CT perfusion abnormality in the left parietal lobe without definite CT findings of acute infarct at this time. Vascular: Intravenous contrast is noted from earlier CTA. Skull: Negative Sinuses/Orbits: Negative Other: None IMPRESSION: No acute hemorrhage following tPA. No change from head CT earlier today. Chronic ischemic changes bilaterally. Electronically Signed   By: Franchot Gallo M.D.   On: 09/01/2017 12:24   Ct Angio Neck W Or Wo Contrast  Result Date: 09/01/2017 CLINICAL DATA:  Code stroke. Weakness and aphasia. Flaccid right side. EXAM: CT ANGIOGRAPHY HEAD AND NECK CT PERFUSION BRAIN TECHNIQUE: Multidetector CT imaging of the head and neck was performed using the standard protocol during bolus administration of intravenous contrast. Multiplanar CT image reconstructions and MIPs were obtained to evaluate the vascular anatomy. Carotid stenosis measurements (when applicable) are obtained utilizing NASCET criteria, using the distal internal carotid diameter as the  denominator. Multiphase CT imaging of the brain was performed following IV bolus contrast injection. Subsequent parametric perfusion maps were calculated using RAPID software. CONTRAST:  Reference EMR COMPARISON:  None. FINDINGS: CT HEAD FINDINGS Brain: There is a large remote right MCA territory infarct affecting temporal and posterior frontal lobes. Remote left frontal operculum infarct. Remote perforator infarct with volume loss in the left basal ganglia and deep white matter tracts. Age-indeterminate lacunar infarct in the left thalamus. Chronic small vessel ischemia. No hemorrhage, hydrocephalus, or masslike finding. Vascular: See below. Skull: Negative Sinuses/Orbits: Negative Other: Negative ASPECTS ([place] Stroke Program Early CT Score) Not scored given the degree of chronic change. CTA NECK FINDINGS Aortic arch: Atherosclerotic calcification.  Three vessel branching. Right carotid system: Moderate bifurcation atheromatous plaque at the common carotid and bifurcation without ulceration or flow limiting stenosis. Negative for beading. Left carotid system: Mild-to-moderate atheromatous plaque at the common carotid bifurcation without flow limiting stenosis or ulceration. Vertebral arteries: No proximal subclavian flow limiting stenosis. Codominant vertebral arteries. No vertebral stenosis or irregularity. Skeleton: Mid right clavicle fracture that is unhealed/nonunited. Other neck: There is a 2 cm ovoid mass with possible polar artery at the central thoracic inlet. Thyroidectomy. There is thyroid ultrasound in 2015 showing goiter. Upper chest: Centrilobular emphysema. Review of the MIP images confirms the above findings CTA HEAD FINDINGS Anterior circulation: Carotid siphon atherosclerosis without flow limiting stenosis or irregularity. Short segment occlusionof the left distal M1 segment just beyond the anterior temporal branch. Left M2 branches are opacified but appear under filled compared to the right.  Atherosclerotic irregularity of right M1 and M2 branches. Negative for aneurysm. Posterior circulation: Vertebral and basilar and widely patent arteries are smooth. Hypoplastic left P1 segment. Moderate to advanced atheromatous narrowing of the distal right P2 segment. Venous sinuses: Patent as permitted by contrast timing. Anatomic variants: As above Delayed phase: Not obtained in the emergent setting Review of the MIP images confirms the above findings CT Brain Perfusion Findings: Perfusion maps have not yet reached PACS at time of signing, but rapid report was reviewed elsewhere. CBF (<30%) Volume: 74m Perfusion (Tmax>6.0s) volume: 161mThese results were called by telephone at the time of interpretation on 09/01/2017 at 10:56 am to Dr. ASAmie Portland who verbally acknowledged these results. IMPRESSION: 1. Distal left M1 short segment occlusion with opacified but under filled left M2 branches. This occlusion is age-indeterminate. No infarct by cerebral perfusion but there is 18cc penumbra/ischemia. 2. No flow limiting stenosis or ulceration in the neck. The patient is wearing an implanted loop recorder. 3. Multifocal remote MCA distribution infarcts. 4. Age-indeterminate left thalamus lacunar infarct. 5. Moderate to advanced right distal P2 segment stenosis. 6. Aortic Atherosclerosis (ICD10-I70.0) and Emphysema (ICD10-J43.9). 7. Unhealed right mid clavicle fracture with surrounding hemorrhage, favored subacute. 8. 2 cm nodule at the thoracic inlet that is high-density or enhancing. Question polar vessel. 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 Electronically Signed   By: JoMonte Fantasia.D.   On: 09/01/2017 11:06   Ct Cerebral Perfusion W Contrast  Result Date: 09/01/2017 CLINICAL DATA:  Code stroke. Weakness and aphasia. Flaccid right side. EXAM: CT ANGIOGRAPHY HEAD AND NECK CT PERFUSION BRAIN TECHNIQUE: Multidetector CT imaging of the head and  neck was performed using the standard protocol during bolus administration of intravenous contrast. Multiplanar CT image reconstructions and MIPs were obtained to evaluate the vascular anatomy. Carotid stenosis measurements (when applicable) are obtained utilizing NASCET criteria, using the distal internal carotid diameter as the denominator. Multiphase CT imaging of the brain was performed following IV bolus contrast injection. Subsequent parametric perfusion maps were calculated using RAPID software. CONTRAST:  Reference EMR COMPARISON:  None. FINDINGS: CT HEAD FINDINGS Brain: There is a large remote right MCA territory infarct affecting temporal and posterior frontal lobes. Remote left frontal operculum infarct. Remote perforator infarct with volume loss in the left basal ganglia and deep white matter tracts. Age-indeterminate lacunar infarct in the left thalamus. Chronic small vessel ischemia. No hemorrhage, hydrocephalus, or masslike finding. Vascular: See below. Skull: Negative Sinuses/Orbits: Negative Other: Negative ASPECTS ([place] Stroke Program Early CT Score) Not scored given the degree of chronic change. CTA NECK FINDINGS Aortic arch: Atherosclerotic calcification.  Three vessel branching. Right carotid system: Moderate bifurcation atheromatous plaque at the common carotid and bifurcation without ulceration or flow limiting stenosis. Negative for beading. Left carotid system: Mild-to-moderate atheromatous plaque at the common carotid bifurcation without flow limiting stenosis or ulceration. Vertebral arteries: No proximal subclavian flow limiting stenosis. Codominant vertebral arteries. No vertebral stenosis or irregularity. Skeleton: Mid right clavicle fracture that is unhealed/nonunited. Other neck: There is a 2 cm ovoid mass with possible polar artery at the central thoracic inlet. Thyroidectomy. There is thyroid ultrasound in 2015 showing goiter. Upper chest: Centrilobular emphysema. Review of the  MIP images confirms the above findings CTA HEAD FINDINGS Anterior circulation: Carotid siphon atherosclerosis without flow limiting stenosis or irregularity. Short segment occlusionof the left distal M1 segment just beyond the anterior temporal branch. Left M2 branches are opacified but appear under filled compared to the right. Atherosclerotic irregularity of right M1 and M2 branches. Negative for aneurysm. Posterior circulation: Vertebral and basilar and widely patent arteries are smooth. Hypoplastic left P1 segment. Moderate to advanced atheromatous narrowing of the distal right P2 segment. Venous sinuses: Patent as permitted by contrast timing. Anatomic variants: As above Delayed phase: Not obtained in the emergent setting Review of the MIP images confirms the above findings CT Brain Perfusion Findings: Perfusion maps have not yet reached PACS at time of signing, but rapid report was reviewed elsewhere. CBF (<30%) Volume: 33m Perfusion (Tmax>6.0s) volume: 126mThese results were called by telephone at the time of interpretation on 09/01/2017 at 10:56 am to Dr. ASAmie Portland who verbally acknowledged these results. IMPRESSION: 1. Distal left M1 short segment occlusion with opacified but under filled left M2 branches. This occlusion is age-indeterminate. No infarct by cerebral perfusion but there is 18cc penumbra/ischemia. 2. No flow limiting stenosis or ulceration in the neck. The patient is wearing an implanted loop recorder. 3. Multifocal remote MCA distribution infarcts. 4. Age-indeterminate left thalamus lacunar infarct. 5. Moderate to advanced right distal P2 segment stenosis. 6. Aortic Atherosclerosis (ICD10-I70.0) and Emphysema (ICD10-J43.9). 7. Unhealed right mid clavicle fracture with surrounding hemorrhage, favored subacute. 8. 2 cm nodule at the thoracic inlet that is high-density or enhancing. Question polar vessel. There is changes of thyroidectomy. Favor residual thyroid tissue or parathyroid  adenoma over adenopathy. Electronically Signed   By: JoMonte Fantasia.D.   On: 09/01/2017 11:06   Dg Chest Port 1 View  Result Date: 09/02/2017 CLINICAL DATA:  COPD.  Hypertension.  History of TIA. EXAM: PORTABLE CHEST 1 VIEW COMPARISON:  Chest x-ray 10/21/2016. FINDINGS: Cardiac monitor device noted. Mild cardiomegaly. No evidence of pulmonary venous congestion. Mild bilateral interstitial prominence. Mild interstitial edema cannot be excluded. A component of the interstitial changes may be chronic. No pleural effusion or pneumothorax. Surgical clips in the neck. IMPRESSION: Mild cardiomegaly. Mild bilateral interstitial prominence noted. A component these changes may be chronic. Mild interstitial edema cannot be excluded. Electronically Signed   By: ThMarcello MooresRegister  On: 09/02/2017 11:16   Dg Foot Complete Right  Result Date: 09/02/2017 CLINICAL DATA:  History of prior fracture EXAM: RIGHT FOOT COMPLETE - 3+ VIEW COMPARISON:  08/14/2017 FINDINGS: Casting material is noted in place limiting fine bony detail. The known fractures are not as well appreciated as on recent CT examination. Fracture of the distal aspect of the second metatarsal is well visualized. No new focal abnormality is noted. IMPRESSION: Casting material limits fine bony detail and known tarsal and metatarsal fractures are not as well appreciated on this exam. No new focal abnormality is seen. Electronically Signed   By: Inez Catalina M.D.   On: 09/02/2017 11:13   Ct Head Code Stroke Wo Contrast  Result Date: 09/01/2017 CLINICAL DATA:  Code stroke. Weakness and aphasia. Flaccid right side. EXAM: CT ANGIOGRAPHY HEAD AND NECK CT PERFUSION BRAIN TECHNIQUE: Multidetector CT imaging of the head and neck was performed using the standard protocol during bolus administration of intravenous contrast. Multiplanar CT image reconstructions and MIPs were obtained to evaluate the vascular anatomy. Carotid stenosis measurements (when applicable) are  obtained utilizing NASCET criteria, using the distal internal carotid diameter as the denominator. Multiphase CT imaging of the brain was performed following IV bolus contrast injection. Subsequent parametric perfusion maps were calculated using RAPID software. CONTRAST:  Reference EMR COMPARISON:  None. FINDINGS: CT HEAD FINDINGS Brain: There is a large remote right MCA territory infarct affecting temporal and posterior frontal lobes. Remote left frontal operculum infarct. Remote perforator infarct with volume loss in the left basal ganglia and deep white matter tracts. Age-indeterminate lacunar infarct in the left thalamus. Chronic small vessel ischemia. No hemorrhage, hydrocephalus, or masslike finding. Vascular: See below. Skull: Negative Sinuses/Orbits: Negative Other: Negative ASPECTS ([place] Stroke Program Early CT Score) Not scored given the degree of chronic change. CTA NECK FINDINGS Aortic arch: Atherosclerotic calcification.  Three vessel branching. Right carotid system: Moderate bifurcation atheromatous plaque at the common carotid and bifurcation without ulceration or flow limiting stenosis. Negative for beading. Left carotid system: Mild-to-moderate atheromatous plaque at the common carotid bifurcation without flow limiting stenosis or ulceration. Vertebral arteries: No proximal subclavian flow limiting stenosis. Codominant vertebral arteries. No vertebral stenosis or irregularity. Skeleton: Mid right clavicle fracture that is unhealed/nonunited. Other neck: There is a 2 cm ovoid mass with possible polar artery at the central thoracic inlet. Thyroidectomy. There is thyroid ultrasound in 2015 showing goiter. Upper chest: Centrilobular emphysema. Review of the MIP images confirms the above findings CTA HEAD FINDINGS Anterior circulation: Carotid siphon atherosclerosis without flow limiting stenosis or irregularity. Short segment occlusionof the left distal M1 segment just beyond the anterior temporal  branch. Left M2 branches are opacified but appear under filled compared to the right. Atherosclerotic irregularity of right M1 and M2 branches. Negative for aneurysm. Posterior circulation: Vertebral and basilar and widely patent arteries are smooth. Hypoplastic left P1 segment. Moderate to advanced atheromatous narrowing of the distal right P2 segment. Venous sinuses: Patent as permitted by contrast timing. Anatomic variants: As above Delayed phase: Not obtained in the emergent setting Review of the MIP images confirms the above findings CT Brain Perfusion Findings: Perfusion maps have not yet reached PACS at time of signing, but rapid report was reviewed elsewhere. CBF (<30%) Volume: 68m Perfusion (Tmax>6.0s) volume: 151mThese results were called by telephone at the time of interpretation on 09/01/2017 at 10:56 am to Dr. ASAmie Portland who verbally acknowledged these results. IMPRESSION: 1. Distal left M1 short segment occlusion with opacified but under filled  left M2 branches. This occlusion is age-indeterminate. No infarct by cerebral perfusion but there is 18cc penumbra/ischemia. 2. No flow limiting stenosis or ulceration in the neck. The patient is wearing an implanted loop recorder. 3. Multifocal remote MCA distribution infarcts. 4. Age-indeterminate left thalamus lacunar infarct. 5. Moderate to advanced right distal P2 segment stenosis. 6. Aortic Atherosclerosis (ICD10-I70.0) and Emphysema (ICD10-J43.9). 7. Unhealed right mid clavicle fracture with surrounding hemorrhage, favored subacute. 8. 2 cm nodule at the thoracic inlet that is high-density or enhancing. Question polar vessel. 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 Electronically Signed   By: Monte Fantasia M.D.   On: 09/01/2017 11:06    Review of Systems  Unable to perform ROS: Mental status change   Blood pressure (!) 107/57, pulse 70, temperature (!) 97.4 F (36.3 C),  temperature source Axillary, resp. rate 12, height '5\' 4"'$  (1.626 m), weight 66 kg (145 lb 8.1 oz), SpO2 99 %. Physical Exam  Constitutional: She appears well-developed and well-nourished. No distress.  HENT:  Head: Normocephalic and atraumatic.  Eyes: Conjunctivae are normal. Right eye exhibits no discharge. Left eye exhibits no discharge. No scleral icterus.  Neck: Normal range of motion.  Cardiovascular: Normal rate and regular rhythm.  Respiratory: Accessory muscle usage present. No respiratory distress.  On Bipap  Large ecchymotic area superior right chest wall, subcutaneous hematoma palpable, no skin tenting  Musculoskeletal:  Right shoulder, elbow, wrist, digits- no skin wounds,  no instability, no blocks to motion  Sens  Ax/R/M/U could not assess  Mot   Ax/ R/ PIN/ M/ AIN/ U could not assess  Rad 2+  RLE No traumatic wounds, ecchymosis, or rash  Short leg splint in place  No knee or ankle effusion  Knee stable to varus/ valgus and anterior/posterior stress  Sens DPN, SPN, TN could not assess  Motor EHL, ext, flex, evers could not assess  No significant edema  Neurological: She is alert.  Skin: Skin is warm and dry. She is not diaphoretic.  Psychiatric: She has a normal mood and affect. Her behavior is normal.    Assessment/Plan: Multiple falls Right clavicle fx -- Would keep in sling and make NWB for now Right foot fxs -- Lisfranc injury already being treated at [facility]. Should remain NWB in splint for now. Follow up with previous ortho surgeon for continued care.    Lisette Abu, PA-C Orthopedic Surgery [phone] 09/02/2017, 11:42 AM

## 2017-09-02 NOTE — Evaluation (Signed)
Physical Therapy Evaluation Patient Details Name: Caroline Blake MRN: [id number] DOB: 03-Apr-1946 Today's Date: 09/02/2017   History of Present Illness  Pt is 72 year old woman admitted 09/01/17 with aphasia and R side weakness.+ acute L frontotemporal lobe infarct, s/p tpa.  PMH: multiple R foot fxs 1/6--splinted and NWB, R clavicle fx 1/20. PMH: COPD, current smoker, HTN, CVA, dementia.     Clinical Impression  Pt presented supine in bed, lethargic, being bathed by nurse techs upon arrival. Prior to admission, pt's family reported that she ambulated with use of SPC and required assistance with ADLs. Pt currently requires total A x2 for bed mobility and was very limited secondary to lethargy, fatigue and pain (R shoulder and R LE). Pt would continue to benefit from skilled physical therapy services at this time while admitted and after d/c to address the below listed limitations in order to improve overall safety and independence with functional mobility.     Follow Up Recommendations SNF;Supervision/Assistance - 24 hour    Equipment Recommendations  None recommended by PT    Recommendations for Other Services       Precautions / Restrictions Precautions Precautions: Fall Required Braces or Orthoses: Sling Restrictions Weight Bearing Restrictions: Yes RUE Weight Bearing: Non weight bearing RLE Weight Bearing: Non weight bearing      Mobility  Bed Mobility Overal bed mobility: Needs Assistance Bed Mobility: Rolling Rolling: +2 for physical assistance;Total assist         General bed mobility comments: pt with no attempt to assist in any way  Transfers                 General transfer comment: deferred secondary to arousal level   Ambulation/Gait                Stairs            Wheelchair Mobility    Modified Rankin (Stroke Patients Only) Modified Rankin (Stroke Patients Only) Pre-Morbid Rankin Score: Moderate disability Modified Rankin:  Severe disability     Balance                                             Pertinent Vitals/Pain Pain Assessment: Faces Faces Pain Scale: Hurts little more Pain Location: R shoulder Pain Descriptors / Indicators: Grimacing;Guarding Pain Intervention(s): Monitored during session;Repositioned    Home Living Family/patient expects to be discharged to:: Skilled nursing facility                      Prior Function Level of Independence: Needs assistance   Gait / Transfers Assistance Needed: pt's family reported that she ambulated with [facility]  ADL's / Homemaking Assistance Needed: Pt self fed, assisted for toileting, bathing and dressing since her falls, longstanding dependence in IADL due to impaired cognition        Hand Dominance   Dominant Hand: Right    Extremity/Trunk Assessment   Upper Extremity Assessment Upper Extremity Assessment: Defer to OT evaluation RUE Deficits / Details: shoulder not assessed due to clavicle fx, full ROM elbow to hand, 2/5 elbow, 3+/5 gross grasp RUE: Unable to fully assess due to immobilization RUE Coordination: decreased gross motor LUE Deficits / Details: 2/5 shoulder and elbow, minimal and inconsistent movement of wrist and hand LUE Coordination: decreased fine motor;decreased gross motor    Lower Extremity Assessment Lower  Extremity Assessment: Generalized weakness;RLE deficits/detail;Difficult to assess due to impaired cognition RLE Deficits / Details: Soft ACE wrap short leg cast in place RLE: Unable to fully assess due to pain;Unable to fully assess due to immobilization       Communication   Communication: Expressive difficulties;HOH  Cognition Arousal/Alertness: Lethargic(unless constantly stimulated) Behavior During Therapy: Flat affect Overall Cognitive Status: Impaired/Different from baseline Area of Impairment: Orientation;Attention;Memory;Following commands;Safety/judgement;Problem  solving;Awareness                 Orientation Level: Disoriented to;Place;Time;Situation Current Attention Level: Focused Memory: Decreased short-term memory;Decreased recall of precautions Following Commands: Follows one step commands with increased time;Follows one step commands inconsistently Safety/Judgement: Decreased awareness of safety;Decreased awareness of deficits Awareness: (pre intellectual) Problem Solving: Slow processing;Decreased initiation;Difficulty sequencing;Requires verbal cues;Requires tactile cues        General Comments      Exercises Other Exercises Other Exercises: performed AAROM L UE all areas and R elbow to hand   Assessment/Plan    PT Assessment Patient needs continued PT services  PT Problem List Decreased strength;Decreased activity tolerance;Decreased range of motion;Decreased balance;Decreased mobility;Decreased cognition;Decreased coordination;Decreased safety awareness;Decreased knowledge of use of DME;Decreased knowledge of precautions;Pain       PT Treatment Interventions DME instruction;Gait training;Functional mobility training;Therapeutic activities;Therapeutic exercise;Balance training;Neuromuscular re-education;Cognitive remediation;Patient/family education;Wheelchair mobility training    PT Goals (Current goals can be found in the Care Plan section)  Acute Rehab PT Goals Patient Stated Goal: Eventually be able to return home. PT Goal Formulation: With family Time For Goal Achievement: 09/16/17 Potential to Achieve Goals: Fair    Frequency Min 3X/week   Barriers to discharge        Co-evaluation PT/OT/SLP Co-Evaluation/Treatment: Yes Reason for Co-Treatment: For patient/therapist safety;To address functional/ADL transfers;Complexity of the patient's impairments (multi-system involvement) PT goals addressed during session: Mobility/safety with mobility OT goals addressed during session: Strengthening/ROM       AM-PAC  PT "6 Clicks" Daily Activity  Outcome Measure Difficulty turning over in bed (including adjusting bedclothes, sheets and blankets)?: Unable Difficulty moving from lying on back to sitting on the side of the bed? : Unable Difficulty sitting down on and standing up from a chair with arms (e.g., wheelchair, bedside commode, etc,.)?: Unable Help needed moving to and from a bed to chair (including a wheelchair)?: Total Help needed walking in hospital room?: Total Help needed climbing 3-5 steps with a railing? : Total 6 Click Score: 6    End of Session   Activity Tolerance: Patient limited by fatigue;Patient limited by lethargy;Patient limited by pain Patient left: in bed;with call bell/phone within reach;with bed alarm set;with family/visitor present Nurse Communication: Mobility status PT Visit Diagnosis: Other abnormalities of gait and mobility (R26.89);Other symptoms and signs involving the nervous system (R29.898)    Time: 6734-1937 PT Time Calculation (min) (ACUTE ONLY): 37 min   Charges:   PT Evaluation $PT Eval Moderate Complexity: 1 Mod     PT G Codes:        [place], PT, DPT [place] 09/02/2017, 2:36 PM

## 2017-09-02 NOTE — Evaluation (Signed)
Clinical/Bedside Swallow Evaluation Patient Details  Name: DELROSE ROHWER MRN: [id number] Date of Birth: May 09, 1946  Today's Date: 09/02/2017 Time: SLP Start Time (ACUTE ONLY): 1535 SLP Stop Time (ACUTE ONLY): 1608 SLP Time Calculation (min) (ACUTE ONLY): 33 min  Past Medical History:  Past Medical History:  Diagnosis Date  . COPD (chronic obstructive pulmonary disease) ([place])   . Stroke [facility])    Past Surgical History: The histories are not reviewed yet. Please review them in the "History" navigator section and refresh this [place]. HPI:  Pt is 72 year old woman admitted 09/01/17 with aphasia and R side weakness.+ acute L frontotemporal lobe infarct, s/p tpa. PMH: COPD, current smoker, HTN, CVA, dementia, and multiple fxs. CXR (1/25) revealed mild cardiomegaly, mild bilateral interstitial prominence noted (possibly chronic). Pt failed stroke swallow screen requiring SLP BSE.   Assessment / Plan / Recommendation Clinical Impression   Pt presents with a functional swallow across all tested consistencies despite rt facial droop. Pt demonstrated no overt signs/symptoms of aspiration. Mod verbal and visual cues required for oral mech exam secondary to cognitive and language deficits. Pt had minimal oral residue following solid trials, but she was able to clear oral cavity without complication with thin liquid wash. Per pt/family request, recommend initiation of Dysphagia 3 (mech soft) solids and thin liquid diet with aspiration precautions: small/slow bites/sips and siting upright for meals. SLP will monitor shortly to ensure tolerance of diet.     SLP Visit Diagnosis: Dysphagia, oral phase (R13.11)    Aspiration Risk  Mild aspiration risk    Diet Recommendation Dysphagia 3 (Mech soft);Thin liquid   Liquid Administration via: Straw;Spoon Medication Administration: Whole meds with liquid Supervision: Staff to assist with self feeding Compensations: Minimize environmental distractions;Small  sips/bites Postural Changes: Seated upright at 90 degrees    Other  Recommendations Oral Care Recommendations: Oral care BID   Follow up Recommendations Other (comment)(none expected for swallowing)      Frequency and Duration min 2x/week  2 weeks       Prognosis Prognosis for Safe Diet Advancement: Fair Barriers to Reach Goals: Cognitive deficits;Language deficits      Swallow Study   General Date of Onset: 09/01/17 HPI: Pt is 72 year old woman admitted 09/01/17 with aphasia and R side weakness.+ acute L frontotemporal lobe infarct, s/p tpa. PMH: COPD, current smoker, HTN, CVA, dementia, and multiple fxs. CXR (1/25) revealed mild cardiomegaly, mild bilateral interstitial prominence noted (possibley chronic). Pt failed stroke swallow screen requiring SLP BSE. Type of Study: Bedside Swallow Evaluation Previous Swallow Assessment: N/A Diet Prior to this Study: NPO Temperature Spikes Noted: No Respiratory Status: Nasal cannula History of Recent Intubation: No Behavior/Cognition: Alert;Cooperative;Pleasant mood Oral Cavity Assessment: Dry Oral Care Completed by SLP: Recent completion by staff Oral Cavity - Dentition: Adequate natural dentition Vision: Functional for self-feeding Self-Feeding Abilities: Needs assist Patient Positioning: Upright in bed Baseline Vocal Quality: Normal Volitional Cough: Weak Volitional Swallow: Unable to elicit(impacted by comprehension)    Oral/Motor/Sensory Function Overall Oral Motor/Sensory Function: Mild impairment Facial ROM: Reduced right Facial Symmetry: Abnormal symmetry right Facial Strength: Within Functional Limits Lingual ROM: Within Functional Limits Lingual Symmetry: Within Functional Limits Lingual Sensation: Within Functional Limits   Ice Chips Ice chips: Within functional limits Presentation: Spoon   Thin Liquid Thin Liquid: Within functional limits Presentation: Spoon;Straw    Nectar Thick Nectar Thick Liquid: Not tested    Honey Thick Honey Thick Liquid: Not tested   Puree Puree: Within functional limits Presentation: Spoon  Solid   GO   [place] Shayan Bramhall SLP Student Clinician  Solid: Within functional limits Presentation: (fed pt small portions of cracker)        [place] Breanah Faddis 09/02/2017,4:53 PM

## 2017-09-02 NOTE — Progress Notes (Signed)
SLP Cancellation Note  Patient Details Name: Caroline Blake MRN: [id number] DOB: 02-12-1946   Cancelled treatment:       Reason Eval/Treat Not Completed: Medical issues which prohibited therapy - currently sleeping and on CPAP. Per RN, will check back later. Note that pt did not pass the stroke swallow screen.   Germain Osgood 09/02/2017, 9:44 AM  Germain Osgood, M.A. CCC-SLP [phone]

## 2017-09-02 NOTE — Plan of Care (Signed)
Pt alert, but confused. CPAP, does not follow commands but keeps mask on . (prior to arrival removed foley by pt/some bleeding noted). Neuro checks every 4 hours per MD Does not verbalize or demonstrate any understanding of education provided. Needs constant reminder and reinforcement.  Family at the bedside, updated by MD. Questions were answered. Verbalizing understanding Bed in low position,matts are placed, alarms are on, call bell in reach. Continue to monitor.

## 2017-09-02 NOTE — Progress Notes (Signed)
During morning assessment Dr. Jaynee Eagles spoke and updated pt and family.  Stated: "No urgency in MRI at this time."   No scan have been done at this time,  Paged to DR. Ahenr, Dr. Katherine Roan, and MD on call. Spoke to Dr. Weldon Picking. See new orders. Pt increasingly getting closer to her baseline. Following command. Speech eval placed orders for diet. No discomfort noted. VS stable, 4L [place], a/ox2 (base line). Family at the bedside. Updated, educated and verbalized the understanding.  Bed in low position/with mat, alarms are on, call bell in reach, continue to monitor.

## 2017-09-02 NOTE — Progress Notes (Signed)
PT Cancellation Note  Patient Details Name: Caroline Blake MRN: [id number] DOB: 1946/04/11   Cancelled Treatment:    Reason Eval/Treat Not Completed: Patient not medically ready. Pt with strict bed rest orders in place. PT will continue to f/u with pt and await updated activity orders.   [place] 09/02/2017, 8:44 AM

## 2017-09-02 NOTE — Consult Note (Signed)
Consultation Note Date: 09/02/2017   Patient Name: Caroline Blake  DOB: May 09, 1946  MRN: [id number]  Age / Sex: 72 y.o., female  PCP: Drake Leach, MD Referring Physician: Melvenia Beam, MD  Reason for Consultation: Establishing goals of care and Psychosocial/spiritual support  HPI/Patient Profile: 72 y.o. female  with past medical history of CVA, vascular dementia, COPD, falls, admitted on 09/01/2017 with aphasia and facial droop.  She was brought to the emergency room as a code stroke.  CT scan was negative for hemorrhage and she was given TPA for a presumed CVA.  CTA demonstrated left M1 occlusion.  Husband reported that at baseline she is only oriented to herself with some baseline stroke and residual effects from her former stroke. Recent course complicated by a fall resulting in a fracture R clavicle (not read as such at the time) and a R lower extremity fracture (2nd/3rd/4th MT base, medial/middle/lateral cuneiform, 2nd MT neck fractures based on ED notes). She was discharged to home with plans for ortho follow up  Consult ordered for goals of care    Clinical Assessment and Goals of Care: With husband, son, daughter and chart reviewed.  Patient apparently is markedly more alert this afternoon than she was this morning.  Patient's son stated "they prepared me this morning that she was dying any minute".  She is now speaking to family and sentences, able to track voices in the room.  Introduction to palliative medicine services was reviewed as well as clinical complications from dementia and CVA such as aphasia as well as dysphasia.  Clearly spoken to her family about DNR DNI, "no heroics".  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  Primary decision maker is her husband, Kaho Selle.  Patient has 1 son and 1 daughter they are making decisions as a family    SUMMARY OF RECOMMENDATIONS   Confirmed DNR/DNI Introduced  topics of artificial feeding risks and benefits.  Reiterated that PEG/Cortrak does not eliminate aspiration risk Family would be agreeable to a temporary feeding measures such as a core track but are undecided about long-term PEG tube They are hoping with her being more alert and awake that she can help guide this decision making Family is waiting for further input from OT/PT/speech and language further evaluate what next steps could look like for example skilled nursing facility with rehab or hospice  Code Status/Advance Care Planning:  DNR    Symptom Management:   Pain:.  With Tylenol for mild pain.  Should stronger medication be indicated, would recommend Dilaudid, fentanyl, or if able to take oral medications oxycodone in the setting of chronic kidney disease creatinine 2.2  Dyspnea: Targeted pulmonary treatments, oxygen nebulizer treatments.  Monitor for need for initiation of opioids  Palliative Prophylaxis:   Aspiration, Bowel Regimen, Delirium Protocol, Eye Care, Frequent Pain Assessment, Oral Care and Turn Reposition   Psycho-social/Spiritual:   Desire for further Chaplaincy support:no  Additional Recommendations: Referral to Community Resources   Prognosis:   Unable to determine  Discharge Planning: To Be Determined  Primary Diagnoses: Present on Admission: . Stroke (cerebrum) ([place])   I have reviewed the medical record, interviewed the patient and family, and examined the patient. The following aspects are pertinent.  Past Medical History:  Diagnosis Date  . COPD (chronic obstructive pulmonary disease) ([place])   . Stroke [facility])    Social History   Socioeconomic History  . Marital status: Married    Spouse name: None  . Number of children: None  . Years of education: None  . Highest education level: None  Social Needs  . Financial resource strain: None  . Food insecurity - worry: None  . Food insecurity - inability: None  . Transportation needs -  medical: None  . Transportation needs - non-medical: None  Occupational History  . None  Tobacco Use  . Smoking status: None  Substance and Sexual Activity  . Alcohol use: None  . Drug use: None  . Sexual activity: None  Other Topics Concern  . None  Social History Narrative  . None   No family history on file. Scheduled Meds: . insulin aspart  0-15 Units Subcutaneous TID WC  . miconazole   Topical BID  . pantoprazole (PROTONIX) IV  40 mg Intravenous QHS   Continuous Infusions: . sodium chloride 75 mL/hr at 09/02/17 0600  . niCARDipine Stopped (09/01/17 2351)   PRN Meds:. Medications Prior to Admission:  Prior to Admission medications   Medication Sig Start Date End Date Taking? Authorizing Provider  amLODipine (NORVASC) 10 MG tablet Take 10 mg by mouth daily.   Yes [provider]  buPROPion (WELLBUTRIN SR) 150 MG 12 hr tablet Take 150 mg by mouth every morning. 05/28/17  Yes [provider]  clopidogrel (PLAVIX) 75 MG tablet Take 75 mg by mouth daily. 05/27/17  Yes [provider]  conjugated estrogens (PREMARIN) vaginal cream Place 1 g vaginally daily as needed for itching. 09/06/14  Yes [provider]  donepezil (ARICEPT) 10 MG tablet Take 5 mg by mouth daily. 05/27/17  Yes [provider]  doxycycline (VIBRAMYCIN) 100 MG capsule Take 100 mg by mouth 2 (two) times daily. 08/24/17  Yes [provider]  ezetimibe (ZETIA) 10 MG tablet Take 10 mg by mouth daily. 04/02/13  Yes [provider]  HYDROcodone-acetaminophen (NORCO/VICODIN) 5-325 MG tablet Take 0.5 tablets by mouth every 6 (six) hours as needed for pain. 08/26/17  Yes [provider]  insulin aspart protamine- aspart (NOVOLOG MIX 70/30) (70-30) 100 UNIT/ML injection Inject 3 Units into the skin 2 (two) times daily.   Yes [provider]  lamoTRIgine (LAMICTAL) 100 MG tablet Take 100 mg by mouth 2 (two) times daily. 08/09/17  Yes [provider]  levothyroxine (SYNTHROID, LEVOTHROID) 150 MCG tablet Take 150 mcg by mouth daily. 08/12/17  Yes [provider]  metoprolol tartrate (LOPRESSOR) 50 MG tablet Take 50 mg by mouth daily. 03/12/13  Yes [provider]  metroNIDAZOLE (METROGEL) 0.75 % vaginal gel Place 1 Applicatorful vaginally daily as needed for itching. 07/29/17  Yes [provider]  mirabegron ER (MYRBETRIQ) 50 MG TB24 tablet Take 25 mg by mouth daily.   Yes [provider]  Omega-3 Fatty Acids (FISH OIL PO) Take 1 capsule by mouth daily.   Yes [provider]  phenazopyridine (PYRIDIUM) 200 MG tablet Take 200 mg by mouth daily as needed. UTI 08/24/17  Yes [provider]  pravastatin (PRAVACHOL) 80 MG tablet Take 80 mg by mouth daily. 05/27/17  Yes [provider]  propranolol (INDERAL) 40 MG tablet Take 40 mg by mouth daily. 05/28/17  Yes [provider]  sertraline (ZOLOFT) 100 MG tablet Take 200 mg by mouth daily. 08/09/17  Yes [provider]  traMADol (ULTRAM) 50 MG tablet Take 50 mg by mouth 3 (three) times daily as needed for pain. 02/25/17  Yes [provider]   Allergies  Allergen Reactions  . Atorvastatin Itching and Other (See Comments)    Myopathy   . Carvedilol Itching  . Cinoxacin Itching and Other (See Comments)    hypotension   . Etodolac Other (See Comments), Itching and Hives  . Metoclopramide Itching  . Moxifloxacin Itching  . Paroxetine Hcl Other (See Comments)    Unknown reaction  . Rosuvastatin Calcium Other (See Comments)    Myalgias  . Nitrofurantoin Other (See Comments)    Yeast infection  . Rivaroxaban Other (See Comments)    GI bleed  . Sulfa Antibiotics Hives and Itching  . Yellow Dyes (Non-Tartrazine) Other (See Comments)    unspecified  . Aspirin-Dipyridamole Er Itching  . Pitavastatin Itching   Review of Systems  Unable to perform ROS: Acuity of condition    Physical Exam    Constitutional: She appears well-developed and well-nourished.  Neck: Normal range of motion.  Cardiovascular:  hypertensive  Pulmonary/Chest: Effort normal.  Neurological: She is alert.  Patient is more alert, will look at you when you speak to her and his speaking in sentences  Skin: Skin is warm and dry. There is pallor.  Psychiatric:  No acute agitation otherwise unable to test  Nursing note and vitals reviewed.   Vital Signs: BP (!) 149/75   Pulse 78   Temp (!) 97.4 F (36.3 C) (Axillary)   Resp 19   Ht 5\' 4"  (1.626 m)   Wt 66 kg (145 lb 8.1 oz)   SpO2 (!) 85%   BMI 24.98 kg/m          SpO2: SpO2: (!) 85 % O2 Device:SpO2: (!) 85 % O2 Flow Rate: .O2 Flow Rate (L/min): 4 L/min  IO: Intake/output summary:   Intake/Output Summary (Last 24 hours) at 09/02/2017 1641 Last data filed at 09/02/2017 1500 Gross per 24 hour  Intake 2516.17 ml  Output 500 ml  Net 2016.17 ml    LBM: Last BM Date: (UTA) Baseline Weight: Weight: 70.4 kg (155 lb 3.3 oz) Most recent weight: Weight: 66 kg (145 lb 8.1 oz)     Palliative Assessment/Data:   Flowsheet Rows     Most Recent Value  Intake Tab  Referral Department  Critical care  Unit at Time of Referral  ICU  Palliative Care Primary Diagnosis  Neurology  Date Notified  09/02/17  Reason for referral  Clarify Goals of Care  Date of Admission  09/01/17  Date first seen by Palliative Care  09/02/17  # of days Palliative referral response time  0 Day(s)  # of days IP prior to Palliative referral  1  Clinical Assessment  Palliative Performance Scale Score  30%  Pain Max last 24 hours  Not able to report  Pain Min Last 24 hours  Not able to report  Dyspnea Max Last 24 Hours  Not able to report  Dyspnea Min Last 24 hours  Not able to report  Nausea Max Last 24 Hours  Not able to report  Nausea Min Last 24 Hours  Not able to report  Anxiety Max Last 24 Hours  Not able to report  Anxiety Min Last 24 Hours  Not able to report   Other Max Last 24 Hours  Not able to report  Psychosocial & Spiritual Assessment  Palliative Care Outcomes  Patient/Family meeting held?  Yes  Who was at the meeting?  husbnd, son and dtr  Patient/Family wishes: Interventions discontinued/not started   Mechanical Ventilation, BiPAP, Trach  Palliative Care follow-up planned  Yes, Facility      Time In: 1400 Time Out: 1515 Time Total: 75 min Greater than 50%  of this time was spent counseling and coordinating care related to the above assessment and plan.  Signed by: Dory Horn, NP   Please contact Palliative Medicine Team phone at [phone] for questions and concerns.  For individual provider: See Shea Evans

## 2017-09-02 NOTE — Progress Notes (Signed)
OT Cancellation Note  Patient Details Name: Caroline Blake MRN: [id number] DOB: [birth year]   Cancelled Treatment:    Reason Eval/Treat Not Completed: Patient not medically ready(Pt with strict bed rest orders. Will follow.)  [name] 09/02/2017, 8:47 AM  09/02/2017 [name], OTR/L Pager: [phone]

## 2017-09-02 NOTE — Progress Notes (Signed)
Orthopedic Tech Progress Note Patient Details:  Caroline Blake January 17, 1946 [id number]  Ortho Devices Type of Ortho Device: Arm sling Ortho Device/Splint Location: rue Ortho Device/Splint Interventions: Application   Post Interventions Patient Tolerated: Well Instructions Provided: Care of device   [name] 09/02/2017, 1:27 PM

## 2017-09-02 NOTE — Progress Notes (Signed)
  Echocardiogram 2D Echocardiogram has been performed.  Caroline Blake 09/02/2017, 11:00 AM

## 2017-09-02 NOTE — Progress Notes (Signed)
CT during RN report. Upcoming RN will call back as soon as able

## 2017-09-02 NOTE — Evaluation (Signed)
Occupational Therapy Evaluation Patient Details Name: Caroline Blake MRN: [id number] DOB: [birth year] Today's Date: 09/02/2017    History of Present Illness Pt is 72 year old woman admitted 09/01/17 with aphasia and R side weakness.+ acute L frontotemporal lobe infarct, s/p tpa.  PMH: multiple R foot fxs 1/6--splinted and NWB, R clavicle fx 1/20. PMH: COPD, current smoker, HTN, CVA, dementia.    Clinical Impression   Pt was needing assist for bathing, dressing and toileting since her fall in early January. She is longstanding dependent in IADL due to dementia. Pt was walking on her R LE using a cane or furniture without assist and would not adhere to NWB status and use of w/c. Limited evaluation this session due to lethargy. Pt somewhat more alert once RT removed bipap and placed  and with arrival of family. Currently, pt is dependent in all ADL and bed level mobility. She will need post acute rehab in SNF upon discharge. Will follow acutely.     Follow Up Recommendations  SNF;Supervision/Assistance - 24 hour    Equipment Recommendations       Recommendations for Other Services       Precautions / Restrictions Precautions Precautions: Fall Required Braces or Orthoses: Sling Restrictions Weight Bearing Restrictions: Yes RUE Weight Bearing: Non weight bearing RLE Weight Bearing: Non weight bearing      Mobility Bed Mobility Overal bed mobility: Needs Assistance Bed Mobility: Rolling Rolling: +2 for physical assistance;Total assist         General bed mobility comments: pt without attempt to assist  Transfers                 General transfer comment: not performed this visit    Balance                                           ADL either performed or assessed with clinical judgement   ADL                                         General ADL Comments: Pt requires total assist.     Vision Baseline Vision/History: Wears  glasses Wears Glasses: Reading only Patient Visual Report: No change from baseline Additional Comments: unable to formally assess due to cognition, appears grossly intact     Perception     Praxis      Pertinent Vitals/Pain Pain Assessment: Faces Faces Pain Scale: Hurts little more Pain Location: R shoulder Pain Descriptors / Indicators: Grimacing;Guarding Pain Intervention(s): Monitored during session;Repositioned     Hand Dominance Right   Extremity/Trunk Assessment Upper Extremity Assessment Upper Extremity Assessment: RUE deficits/detail;LUE deficits/detail RUE Deficits / Details: shoulder not assessed due to clavicle fx, full ROM elbow to hand, 2/5 elbow, 3+/5 gross grasp RUE: Unable to fully assess due to immobilization RUE Coordination: decreased gross motor LUE Deficits / Details: 2/5 shoulder and elbow, minimal and inconsistent movement of wrist and hand LUE Coordination: decreased fine motor;decreased gross motor   Lower Extremity Assessment Lower Extremity Assessment: Defer to PT evaluation       Communication Communication Communication: Expressive difficulties;HOH   Cognition Arousal/Alertness: Lethargic(unless constantly stimulated) Behavior During Therapy: Flat affect Overall Cognitive Status: Impaired/Different from baseline Area of Impairment: Orientation;Attention;Memory;Following commands;Safety/judgement;Problem solving;Awareness  Orientation Level: Disoriented to;Place;Time;Situation Current Attention Level: Focused Memory: Decreased short-term memory;Decreased recall of precautions Following Commands: Follows one step commands with increased time;Follows one step commands inconsistently Safety/Judgement: Decreased awareness of safety;Decreased awareness of deficits Awareness: (pre intellectual) Problem Solving: Slow processing;Decreased initiation;Difficulty sequencing;Requires verbal cues;Requires tactile cues     General  Comments       Exercises Exercises: Other exercises Other Exercises Other Exercises: performed AAROM L UE all areas and R elbow to hand   Shoulder Instructions      Home Living Family/patient expects to be discharged to:: Skilled nursing facility                                        Prior Functioning/Environment Level of Independence: Needs assistance  Gait / Transfers Assistance Needed: walked sometimes with cane and furniture walked, she was non adherent to NWB status and use of w/c ADL's / Homemaking Assistance Needed: Pt self fed, assisted for toileting, bathing and dressing since her falls, longstanding dependence in IADL due to impaired cognition            OT Problem List: Decreased strength;Decreased activity tolerance;Decreased coordination;Decreased safety awareness;Decreased cognition;Pain;Impaired UE functional use;Cardiopulmonary status limiting activity      OT Treatment/Interventions: Self-care/ADL training;DME and/or AE instruction;Patient/family education;Balance training;Cognitive remediation/compensation;Therapeutic activities    OT Goals(Current goals can be found in the care plan section) Acute Rehab OT Goals Patient Stated Goal: Eventually be able to return home. OT Goal Formulation: With family Time For Goal Achievement: 09/16/17 Potential to Achieve Goals: Fair ADL Goals Pt Will Perform Eating: with mod assist;sitting(when cleared for PO) Pt Will Perform Grooming: with mod assist;sitting Pt/caregiver will Perform Home Exercise Program: Increased ROM;Increased strength;With Supervision(family will be knowledgeable in R elbow to hand and L UE ROM) Additional ADL Goal #1: Pt will perform bed mobility with moderate assistance. Additional ADL Goal #2: Pt will sit EOB with minimal assist x 10 minutes in preparation for ADL.  OT Frequency: Min 2X/week   Barriers to D/C:            Co-evaluation PT/OT/SLP Co-Evaluation/Treatment:  Yes Reason for Co-Treatment: Complexity of the patient's impairments (multi-system involvement);For patient/therapist safety   OT goals addressed during session: Strengthening/ROM      AM-PAC PT "6 Clicks" Daily Activity     Outcome Measure Help from another person eating meals?: Total Help from another person taking care of personal grooming?: Total Help from another person toileting, which includes using toliet, bedpan, or urinal?: Total Help from another person bathing (including washing, rinsing, drying)?: Total Help from another person to put on and taking off regular upper body clothing?: Total Help from another person to put on and taking off regular lower body clothing?: Total 6 Click Score: 6   End of Session Equipment Utilized During Treatment: Oxygen Nurse Communication: (RT removed bipap and place 02)  Activity Tolerance: Patient limited by lethargy Patient left: in bed;with call bell/phone within reach;with bed alarm set;with family/visitor present  OT Visit Diagnosis: Cognitive communication deficit (R41.841);Pain;Muscle weakness (generalized) (M62.81);History of falling (Z91.81)                Time: 4270-6237 OT Time Calculation (min): 38 min Charges:  OT General Charges $OT Visit: 1 Visit OT Evaluation $OT Eval Moderate Complexity: 1 Mod OT Treatments $Therapeutic Activity: 8-22 mins G-Codes:     2017-09-15 [name], OTR/L Pager: [phone]  [name] 09/02/2017, 1:58 PM

## 2017-09-02 NOTE — Consult Note (Addendum)
Name: Caroline Blake MRN: [id number] DOB: [birth year]    ADMISSION DATE:  09/01/2017 CONSULTATION DATE:  1/24  REFERRING MD :  Dr. Rory Percy  CHIEF COMPLAINT:  CVA  HISTORY OF PRESENT ILLNESS:  Patient is encephalopathic and/or intubated. Therefore history has been obtained from chart review. 72 year old female with PMH as below, which is significant for COPD and stroke. Husband reported that at baseline she is only oriented to herself with some baseline stroke and residual effects from her former stroke. Recent course complicated by a fall resulting in a fracture R clavicle (not read as such at the time) and a R lower extremity fracture (2nd/3rd/4th MT base, medial/middle/lateral cuneiform, 2nd MT neck fractures based on ED notes). She was discharged to home with plans for ortho follow up. Then 1/24 she awoke from sleep as normal but a short time after (around 930am) she developed aphasia and facial droop. She was brought to ED as a code stroke. She had CT scan negative for hemorrhage and was given tPA for presumed CVA. CTA demonstrated L M1 occlusion. She was deemed to not be a candidate for IR intervention. She became hypertensive requiring nicardipine infusion. She was admitted to ICU and PCCM asked to see.    SIGNIFICANT EVENTS  1/6 RLE fracture diagnosed 1/20 Fall, R clavicle fracture 1/24 admit for CVA, tPA given.   STUDIES:  CT head/angio/perfusion 1/24 > Distal left M1 short segment occlusion with opacified but under filled left M2 branches. This occlusion is age-indeterminate. No infarct by cerebral perfusion but there is 18cc penumbra/ischemia. No flow limiting stenosis or ulceration in the neck. The patient is wearing an implanted loop recorder. Multifocal remote MCA distribution infarcts. Age-indeterminate left thalamus lacunar infarct. Moderate to advanced right distal P2 segment stenosis. Unhealed right mid clavicle fracture with surrounding hemorrhage, favored subacute. 2 cm nodule  at the thoracic inlet that is high-density or enhancing. Question polar vessel. There is changes of thyroidectomy.    SUBJECTIVE: Agitated at times sedated with Ativan on CPAP currently  VITAL SIGNS: Temp:  [97.4 F (36.3 C)-98.2 F (36.8 C)] 97.4 F (36.3 C) (01/25 0726) Pulse Rate:  [33-117] 74 (01/25 0900) Resp:  [10-30] 18 (01/25 0900) BP: (97-183)/(43-99) 107/93 (01/25 0900) SpO2:  [87 %-100 %] 92 % (01/25 0900) Weight:  [66 kg (145 lb 8.1 oz)-70.4 kg (155 lb 3.3 oz)] 66 kg (145 lb 8.1 oz) (01/24 2145)  PHYSICAL EXAMINATION: General: Frail female on noninvasive mechanical ventilatory support, not following commands. HEENT: Currently with full facemask in place.  Right clavicular area with gross hematoma and ecchymosis PSY: Agitated Neuro: Does not follow commands, moves all extremities CV: Heart sounds are regular PULM: Poor excursion bilaterally TD:DUKG, non-tender, bsx4 active  Extremities: warm/dry, mild edema  Skin: no rashes or lesions, areas of ecchymosis right clavicular area and right ankle as cast with good capillary refill   Recent Labs  Lab 09/01/17 1035 09/01/17 1051 09/01/17 1104  NA 141 135 138  K 4.0 3.7 3.7  CL 110 108 106  CO2  --  16*  --   BUN 67* 71* 60*  CREATININE 2.40* 2.21* 2.20*  GLUCOSE 128* 126* 121*   Recent Labs  Lab 09/01/17 1035 09/01/17 1051 09/01/17 1104  HGB 13.3 10.6* 11.2*  HCT 39.0 33.7* 33.0*  WBC  --  10.4  --   PLT  --  211  --    Ct Angio Head W Or Wo Contrast  Result Date: 09/01/2017 CLINICAL DATA:  Code stroke. Weakness and aphasia. Flaccid right side. EXAM: CT ANGIOGRAPHY HEAD AND NECK CT PERFUSION BRAIN TECHNIQUE: Multidetector CT imaging of the head and neck was performed using the standard protocol during bolus administration of intravenous contrast. Multiplanar CT image reconstructions and MIPs were obtained to evaluate the vascular anatomy. Carotid stenosis measurements (when applicable) are obtained utilizing  NASCET criteria, using the distal internal carotid diameter as the denominator. Multiphase CT imaging of the brain was performed following IV bolus contrast injection. Subsequent parametric perfusion maps were calculated using RAPID software. CONTRAST:  Reference EMR COMPARISON:  None. FINDINGS: CT HEAD FINDINGS Brain: There is a large remote right MCA territory infarct affecting temporal and posterior frontal lobes. Remote left frontal operculum infarct. Remote perforator infarct with volume loss in the left basal ganglia and deep white matter tracts. Age-indeterminate lacunar infarct in the left thalamus. Chronic small vessel ischemia. No hemorrhage, hydrocephalus, or masslike finding. Vascular: See below. Skull: Negative Sinuses/Orbits: Negative Other: Negative ASPECTS ([place] Stroke Program Early CT Score) Not scored given the degree of chronic change. CTA NECK FINDINGS Aortic arch: Atherosclerotic calcification.  Three vessel branching. Right carotid system: Moderate bifurcation atheromatous plaque at the common carotid and bifurcation without ulceration or flow limiting stenosis. Negative for beading. Left carotid system: Mild-to-moderate atheromatous plaque at the common carotid bifurcation without flow limiting stenosis or ulceration. Vertebral arteries: No proximal subclavian flow limiting stenosis. Codominant vertebral arteries. No vertebral stenosis or irregularity. Skeleton: Mid right clavicle fracture that is unhealed/nonunited. Other neck: There is a 2 cm ovoid mass with possible polar artery at the central thoracic inlet. Thyroidectomy. There is thyroid ultrasound in 2015 showing goiter. Upper chest: Centrilobular emphysema. Review of the MIP images confirms the above findings CTA HEAD FINDINGS Anterior circulation: Carotid siphon atherosclerosis without flow limiting stenosis or irregularity. Short segment occlusionof the left distal M1 segment just beyond the anterior temporal branch. Left M2  branches are opacified but appear under filled compared to the right. Atherosclerotic irregularity of right M1 and M2 branches. Negative for aneurysm. Posterior circulation: Vertebral and basilar and widely patent arteries are smooth. Hypoplastic left P1 segment. Moderate to advanced atheromatous narrowing of the distal right P2 segment. Venous sinuses: Patent as permitted by contrast timing. Anatomic variants: As above Delayed phase: Not obtained in the emergent setting Review of the MIP images confirms the above findings CT Brain Perfusion Findings: Perfusion maps have not yet reached PACS at time of signing, but rapid report was reviewed elsewhere. CBF (<30%) Volume: 20mL Perfusion (Tmax>6.0s) volume: 49mL These results were called by telephone at the time of interpretation on 09/01/2017 at 10:56 am to Dr. Amie Portland , who verbally acknowledged these results. IMPRESSION: 1. Distal left M1 short segment occlusion with opacified but under filled left M2 branches. This occlusion is age-indeterminate. No infarct by cerebral perfusion but there is 18cc penumbra/ischemia. 2. No flow limiting stenosis or ulceration in the neck. The patient is wearing an implanted loop recorder. 3. Multifocal remote MCA distribution infarcts. 4. Age-indeterminate left thalamus lacunar infarct. 5. Moderate to advanced right distal P2 segment stenosis. 6. Aortic Atherosclerosis (ICD10-I70.0) and Emphysema (ICD10-J43.9). 7. Unhealed right mid clavicle fracture with surrounding hemorrhage, favored subacute. 8. 2 cm nodule at the thoracic inlet that is high-density or enhancing. Question polar vessel. 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 Electronically Signed   By: Monte Fantasia M.D.   On: 09/01/2017 11:06   Ct Head Wo Contrast  Result Date: 09/01/2017  CLINICAL DATA:  Stroke.  Post tPA EXAM: CT HEAD WITHOUT CONTRAST TECHNIQUE: Contiguous axial images were obtained from  the base of the skull through the vertex without intravenous contrast. COMPARISON:  CT head and CTA head 09/01/2017 FINDINGS: Brain: Negative for acute hemorrhage Chronic infarct right temporoparietal lobe unchanged. Chronic infarct left inferior frontal lobe unchanged. Chronic infarct in the left internal capsule unchanged. Ventricle size normal. No midline shift CT perfusion abnormality in the left parietal lobe without definite CT findings of acute infarct at this time. Vascular: Intravenous contrast is noted from earlier CTA. Skull: Negative Sinuses/Orbits: Negative Other: None IMPRESSION: No acute hemorrhage following tPA. No change from head CT earlier today. Chronic ischemic changes bilaterally. Electronically Signed   By: Franchot Gallo M.D.   On: 09/01/2017 12:24   Ct Angio Neck W Or Wo Contrast  Result Date: 09/01/2017 CLINICAL DATA:  Code stroke. Weakness and aphasia. Flaccid right side. EXAM: CT ANGIOGRAPHY HEAD AND NECK CT PERFUSION BRAIN TECHNIQUE: Multidetector CT imaging of the head and neck was performed using the standard protocol during bolus administration of intravenous contrast. Multiplanar CT image reconstructions and MIPs were obtained to evaluate the vascular anatomy. Carotid stenosis measurements (when applicable) are obtained utilizing NASCET criteria, using the distal internal carotid diameter as the denominator. Multiphase CT imaging of the brain was performed following IV bolus contrast injection. Subsequent parametric perfusion maps were calculated using RAPID software. CONTRAST:  Reference EMR COMPARISON:  None. FINDINGS: CT HEAD FINDINGS Brain: There is a large remote right MCA territory infarct affecting temporal and posterior frontal lobes. Remote left frontal operculum infarct. Remote perforator infarct with volume loss in the left basal ganglia and deep white matter tracts. Age-indeterminate lacunar infarct in the left thalamus. Chronic small vessel ischemia. No hemorrhage,  hydrocephalus, or masslike finding. Vascular: See below. Skull: Negative Sinuses/Orbits: Negative Other: Negative ASPECTS ([place] Stroke Program Early CT Score) Not scored given the degree of chronic change. CTA NECK FINDINGS Aortic arch: Atherosclerotic calcification.  Three vessel branching. Right carotid system: Moderate bifurcation atheromatous plaque at the common carotid and bifurcation without ulceration or flow limiting stenosis. Negative for beading. Left carotid system: Mild-to-moderate atheromatous plaque at the common carotid bifurcation without flow limiting stenosis or ulceration. Vertebral arteries: No proximal subclavian flow limiting stenosis. Codominant vertebral arteries. No vertebral stenosis or irregularity. Skeleton: Mid right clavicle fracture that is unhealed/nonunited. Other neck: There is a 2 cm ovoid mass with possible polar artery at the central thoracic inlet. Thyroidectomy. There is thyroid ultrasound in 2015 showing goiter. Upper chest: Centrilobular emphysema. Review of the MIP images confirms the above findings CTA HEAD FINDINGS Anterior circulation: Carotid siphon atherosclerosis without flow limiting stenosis or irregularity. Short segment occlusionof the left distal M1 segment just beyond the anterior temporal branch. Left M2 branches are opacified but appear under filled compared to the right. Atherosclerotic irregularity of right M1 and M2 branches. Negative for aneurysm. Posterior circulation: Vertebral and basilar and widely patent arteries are smooth. Hypoplastic left P1 segment. Moderate to advanced atheromatous narrowing of the distal right P2 segment. Venous sinuses: Patent as permitted by contrast timing. Anatomic variants: As above Delayed phase: Not obtained in the emergent setting Review of the MIP images confirms the above findings CT Brain Perfusion Findings: Perfusion maps have not yet reached PACS at time of signing, but rapid report was reviewed elsewhere. CBF  (<30%) Volume: 36mL Perfusion (Tmax>6.0s) volume: 67mL These results were called by telephone at the time of interpretation on 09/01/2017 at 10:56 am  to Dr. Amie Portland , who verbally acknowledged these results. IMPRESSION: 1. Distal left M1 short segment occlusion with opacified but under filled left M2 branches. This occlusion is age-indeterminate. No infarct by cerebral perfusion but there is 18cc penumbra/ischemia. 2. No flow limiting stenosis or ulceration in the neck. The patient is wearing an implanted loop recorder. 3. Multifocal remote MCA distribution infarcts. 4. Age-indeterminate left thalamus lacunar infarct. 5. Moderate to advanced right distal P2 segment stenosis. 6. Aortic Atherosclerosis (ICD10-I70.0) and Emphysema (ICD10-J43.9). 7. Unhealed right mid clavicle fracture with surrounding hemorrhage, favored subacute. 8. 2 cm nodule at the thoracic inlet that is high-density or enhancing. Question polar vessel. 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 Electronically Signed   By: Monte Fantasia M.D.   On: 09/01/2017 11:06   Ct Cerebral Perfusion W Contrast  Result Date: 09/01/2017 CLINICAL DATA:  Code stroke. Weakness and aphasia. Flaccid right side. EXAM: CT ANGIOGRAPHY HEAD AND NECK CT PERFUSION BRAIN TECHNIQUE: Multidetector CT imaging of the head and neck was performed using the standard protocol during bolus administration of intravenous contrast. Multiplanar CT image reconstructions and MIPs were obtained to evaluate the vascular anatomy. Carotid stenosis measurements (when applicable) are obtained utilizing NASCET criteria, using the distal internal carotid diameter as the denominator. Multiphase CT imaging of the brain was performed following IV bolus contrast injection. Subsequent parametric perfusion maps were calculated using RAPID software. CONTRAST:  Reference EMR COMPARISON:  None. FINDINGS: CT HEAD FINDINGS Brain: There  is a large remote right MCA territory infarct affecting temporal and posterior frontal lobes. Remote left frontal operculum infarct. Remote perforator infarct with volume loss in the left basal ganglia and deep white matter tracts. Age-indeterminate lacunar infarct in the left thalamus. Chronic small vessel ischemia. No hemorrhage, hydrocephalus, or masslike finding. Vascular: See below. Skull: Negative Sinuses/Orbits: Negative Other: Negative ASPECTS ([place] Stroke Program Early CT Score) Not scored given the degree of chronic change. CTA NECK FINDINGS Aortic arch: Atherosclerotic calcification.  Three vessel branching. Right carotid system: Moderate bifurcation atheromatous plaque at the common carotid and bifurcation without ulceration or flow limiting stenosis. Negative for beading. Left carotid system: Mild-to-moderate atheromatous plaque at the common carotid bifurcation without flow limiting stenosis or ulceration. Vertebral arteries: No proximal subclavian flow limiting stenosis. Codominant vertebral arteries. No vertebral stenosis or irregularity. Skeleton: Mid right clavicle fracture that is unhealed/nonunited. Other neck: There is a 2 cm ovoid mass with possible polar artery at the central thoracic inlet. Thyroidectomy. There is thyroid ultrasound in 2015 showing goiter. Upper chest: Centrilobular emphysema. Review of the MIP images confirms the above findings CTA HEAD FINDINGS Anterior circulation: Carotid siphon atherosclerosis without flow limiting stenosis or irregularity. Short segment occlusionof the left distal M1 segment just beyond the anterior temporal branch. Left M2 branches are opacified but appear under filled compared to the right. Atherosclerotic irregularity of right M1 and M2 branches. Negative for aneurysm. Posterior circulation: Vertebral and basilar and widely patent arteries are smooth. Hypoplastic left P1 segment. Moderate to advanced atheromatous narrowing of the distal right P2  segment. Venous sinuses: Patent as permitted by contrast timing. Anatomic variants: As above Delayed phase: Not obtained in the emergent setting Review of the MIP images confirms the above findings CT Brain Perfusion Findings: Perfusion maps have not yet reached PACS at time of signing, but rapid report was reviewed elsewhere. CBF (<30%) Volume: 20mL Perfusion (Tmax>6.0s) volume: 67mL These results were called by telephone at the time of interpretation on  09/01/2017 at 10:56 am to Dr. Amie Portland , who verbally acknowledged these results. IMPRESSION: 1. Distal left M1 short segment occlusion with opacified but under filled left M2 branches. This occlusion is age-indeterminate. No infarct by cerebral perfusion but there is 18cc penumbra/ischemia. 2. No flow limiting stenosis or ulceration in the neck. The patient is wearing an implanted loop recorder. 3. Multifocal remote MCA distribution infarcts. 4. Age-indeterminate left thalamus lacunar infarct. 5. Moderate to advanced right distal P2 segment stenosis. 6. Aortic Atherosclerosis (ICD10-I70.0) and Emphysema (ICD10-J43.9). 7. Unhealed right mid clavicle fracture with surrounding hemorrhage, favored subacute. 8. 2 cm nodule at the thoracic inlet that is high-density or enhancing. Question polar vessel. 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 Electronically Signed   By: Monte Fantasia M.D.   On: 09/01/2017 11:06   Ct Head Code Stroke Wo Contrast  Result Date: 09/01/2017 CLINICAL DATA:  Code stroke. Weakness and aphasia. Flaccid right side. EXAM: CT ANGIOGRAPHY HEAD AND NECK CT PERFUSION BRAIN TECHNIQUE: Multidetector CT imaging of the head and neck was performed using the standard protocol during bolus administration of intravenous contrast. Multiplanar CT image reconstructions and MIPs were obtained to evaluate the vascular anatomy. Carotid stenosis measurements (when applicable) are obtained  utilizing NASCET criteria, using the distal internal carotid diameter as the denominator. Multiphase CT imaging of the brain was performed following IV bolus contrast injection. Subsequent parametric perfusion maps were calculated using RAPID software. CONTRAST:  Reference EMR COMPARISON:  None. FINDINGS: CT HEAD FINDINGS Brain: There is a large remote right MCA territory infarct affecting temporal and posterior frontal lobes. Remote left frontal operculum infarct. Remote perforator infarct with volume loss in the left basal ganglia and deep white matter tracts. Age-indeterminate lacunar infarct in the left thalamus. Chronic small vessel ischemia. No hemorrhage, hydrocephalus, or masslike finding. Vascular: See below. Skull: Negative Sinuses/Orbits: Negative Other: Negative ASPECTS ([place] Stroke Program Early CT Score) Not scored given the degree of chronic change. CTA NECK FINDINGS Aortic arch: Atherosclerotic calcification.  Three vessel branching. Right carotid system: Moderate bifurcation atheromatous plaque at the common carotid and bifurcation without ulceration or flow limiting stenosis. Negative for beading. Left carotid system: Mild-to-moderate atheromatous plaque at the common carotid bifurcation without flow limiting stenosis or ulceration. Vertebral arteries: No proximal subclavian flow limiting stenosis. Codominant vertebral arteries. No vertebral stenosis or irregularity. Skeleton: Mid right clavicle fracture that is unhealed/nonunited. Other neck: There is a 2 cm ovoid mass with possible polar artery at the central thoracic inlet. Thyroidectomy. There is thyroid ultrasound in 2015 showing goiter. Upper chest: Centrilobular emphysema. Review of the MIP images confirms the above findings CTA HEAD FINDINGS Anterior circulation: Carotid siphon atherosclerosis without flow limiting stenosis or irregularity. Short segment occlusionof the left distal M1 segment just beyond the anterior temporal branch.  Left M2 branches are opacified but appear under filled compared to the right. Atherosclerotic irregularity of right M1 and M2 branches. Negative for aneurysm. Posterior circulation: Vertebral and basilar and widely patent arteries are smooth. Hypoplastic left P1 segment. Moderate to advanced atheromatous narrowing of the distal right P2 segment. Venous sinuses: Patent as permitted by contrast timing. Anatomic variants: As above Delayed phase: Not obtained in the emergent setting Review of the MIP images confirms the above findings CT Brain Perfusion Findings: Perfusion maps have not yet reached PACS at time of signing, but rapid report was reviewed elsewhere. CBF (<30%) Volume: 8mL Perfusion (Tmax>6.0s) volume: 25mL These results were called by telephone at  the time of interpretation on 09/01/2017 at 10:56 am to Dr. Amie Portland , who verbally acknowledged these results. IMPRESSION: 1. Distal left M1 short segment occlusion with opacified but under filled left M2 branches. This occlusion is age-indeterminate. No infarct by cerebral perfusion but there is 18cc penumbra/ischemia. 2. No flow limiting stenosis or ulceration in the neck. The patient is wearing an implanted loop recorder. 3. Multifocal remote MCA distribution infarcts. 4. Age-indeterminate left thalamus lacunar infarct. 5. Moderate to advanced right distal P2 segment stenosis. 6. Aortic Atherosclerosis (ICD10-I70.0) and Emphysema (ICD10-J43.9). 7. Unhealed right mid clavicle fracture with surrounding hemorrhage, favored subacute. 8. 2 cm nodule at the thoracic inlet that is high-density or enhancing. Question polar vessel. 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 Electronically Signed   By: Monte Fantasia M.D.   On: 09/01/2017 11:06    ASSESSMENT / PLAN:  72yo female with hx COPD and previous stroke admitted 1/24 with acute stroke c/b HTN and bleeding at multiple fracture sites after  recent fall.   L MCA stroke  S/p tPA AMS  PLAN -  Per stroke team  BP management as below  Repeat imaging without acute hemorrhage after TPA but may need to image again this evening given worsening mental status after initial improvement and concern for other bleeding   HTN  PLAN -  Continue cardene gtt   coagulopathy s/p TPA PLAN -  Hematomas marked - monitor closely  Ortho to see for fractures   ?aspiration r/t nausea/vomiting  Now on NIMVS on 09/02/2017 which may increase her risk of aspiration. Hx COPD  PLAN -  Stat portable chest x-ray O2 and CPAP as needed CPAP been questionable aspiration do not mix well may end up intubated. Empiric abx for now  PRN BD    App CCT 30 min   Richardson Landry Minor ACNP Maryanna Shape PCCM Pager [phone] till 1 pm If no answer page [phone] 09/02/2017, 9:52 AM  Attending Note:  72 year old female that fell and suffered an ischemic CVA who was transferred to the ICU for respiratory failure and concern for ability to protect her airway.  S/P tPA that received 1 Mg of ativan overnight that is now unresponsive on CPAP.  I reviewed CXR myself, no acute disease noted.  Discussed with PCCM-NP.  Will check CXR now, if evidence of aspiration then will intubate.  If normal then will attempt to reverse the ativan and if mental status remains this poor then will intubate.  Will send extended lytes and address accordingly.  PCCM will continue to follow.  Spoke with family at length, patient has advanced dementia and multiple stokes.  Will proceed as DNR and order a CXR if evidence of aspiration then will proceed with comfort.  Palliative care consult ordered.  The patient is critically ill with multiple organ systems failure and requires high complexity decision making for assessment and support, frequent evaluation and titration of therapies, application of advanced monitoring technologies and extensive interpretation of multiple databases.   Critical Care  Time devoted to patient care services described in this note is  35  Minutes. This time reflects time of care of this signee Dr Jennet Maduro. This critical care time does not reflect procedure time, or teaching time or supervisory time of PA/NP/Med student/Med Resident etc but could involve care discussion time.  Rush Farmer, M.D. [facility]. Pager: [phone]. After hours pager: [phone].

## 2017-09-03 ENCOUNTER — Encounter (HOSPITAL_COMMUNITY): Payer: Self-pay

## 2017-09-03 ENCOUNTER — Inpatient Hospital Stay (HOSPITAL_COMMUNITY): Payer: Medicare Other

## 2017-09-03 ENCOUNTER — Other Ambulatory Visit: Payer: Self-pay

## 2017-09-03 DIAGNOSIS — Z87898 Personal history of other specified conditions: Secondary | ICD-10-CM

## 2017-09-03 DIAGNOSIS — Z8673 Personal history of transient ischemic attack (TIA), and cerebral infarction without residual deficits: Secondary | ICD-10-CM

## 2017-09-03 DIAGNOSIS — G4733 Obstructive sleep apnea (adult) (pediatric): Secondary | ICD-10-CM

## 2017-09-03 DIAGNOSIS — I63442 Cerebral infarction due to embolism of left cerebellar artery: Secondary | ICD-10-CM

## 2017-09-03 DIAGNOSIS — I480 Paroxysmal atrial fibrillation: Secondary | ICD-10-CM

## 2017-09-03 DIAGNOSIS — J432 Centrilobular emphysema: Secondary | ICD-10-CM

## 2017-09-03 DIAGNOSIS — F01518 Vascular dementia, unspecified severity, with other behavioral disturbance: Secondary | ICD-10-CM

## 2017-09-03 DIAGNOSIS — F0151 Vascular dementia with behavioral disturbance: Secondary | ICD-10-CM

## 2017-09-03 LAB — CBC WITH DIFFERENTIAL/PLATELET
BASOS ABS: 0 10*3/uL (ref 0.0–0.1)
Basophils Relative: 0 %
EOS ABS: 0.3 10*3/uL (ref 0.0–0.7)
EOS PCT: 2 %
HCT: 34.4 % — ABNORMAL LOW (ref 36.0–46.0)
Hemoglobin: 10.8 g/dL — ABNORMAL LOW (ref 12.0–15.0)
Lymphocytes Relative: 12 %
Lymphs Abs: 1.4 10*3/uL (ref 0.7–4.0)
MCH: 25.7 pg — AB (ref 26.0–34.0)
MCHC: 31.4 g/dL (ref 30.0–36.0)
MCV: 81.9 fL (ref 78.0–100.0)
MONO ABS: 0.6 10*3/uL (ref 0.1–1.0)
Monocytes Relative: 5 %
Neutro Abs: 9.5 10*3/uL — ABNORMAL HIGH (ref 1.7–7.7)
Neutrophils Relative %: 81 %
PLATELETS: 193 10*3/uL (ref 150–400)
RBC: 4.2 MIL/uL (ref 3.87–5.11)
RDW: 16.8 % — AB (ref 11.5–15.5)
WBC: 11.7 10*3/uL — ABNORMAL HIGH (ref 4.0–10.5)

## 2017-09-03 LAB — BASIC METABOLIC PANEL
Anion gap: 13 (ref 5–15)
BUN: 58 mg/dL — AB (ref 6–20)
CO2: 13 mmol/L — ABNORMAL LOW (ref 22–32)
CREATININE: 2.26 mg/dL — AB (ref 0.44–1.00)
Calcium: 6.7 mg/dL — ABNORMAL LOW (ref 8.9–10.3)
Chloride: 119 mmol/L — ABNORMAL HIGH (ref 101–111)
GFR calc Af Amer: 24 mL/min — ABNORMAL LOW (ref >60)
GFR, EST NON AFRICAN AMERICAN: 21 mL/min — AB (ref >60)
GLUCOSE: 98 mg/dL (ref 65–99)
POTASSIUM: 4.2 mmol/L (ref 3.5–5.1)
SODIUM: 145 mmol/L (ref 135–145)

## 2017-09-03 LAB — GLUCOSE, CAPILLARY
GLUCOSE-CAPILLARY: 100 mg/dL — AB (ref 65–99)
GLUCOSE-CAPILLARY: 115 mg/dL — AB (ref 65–99)
Glucose-Capillary: 102 mg/dL — ABNORMAL HIGH (ref 65–99)
Glucose-Capillary: 104 mg/dL — ABNORMAL HIGH (ref 65–99)
Glucose-Capillary: 133 mg/dL — ABNORMAL HIGH (ref 65–99)
Glucose-Capillary: 143 mg/dL — ABNORMAL HIGH (ref 65–99)

## 2017-09-03 LAB — PHOSPHORUS: Phosphorus: 5.7 mg/dL — ABNORMAL HIGH (ref 2.5–4.6)

## 2017-09-03 LAB — MAGNESIUM: MAGNESIUM: 1.7 mg/dL (ref 1.7–2.4)

## 2017-09-03 MED ORDER — PRAVASTATIN SODIUM 40 MG PO TABS
80.0000 mg | ORAL_TABLET | Freq: Every day | ORAL | Status: DC
  Administered 2017-09-04 – 2017-09-05 (×2): 80 mg via ORAL
  Filled 2017-09-03 (×2): qty 2

## 2017-09-03 MED ORDER — LAMOTRIGINE 25 MG PO TABS
25.0000 mg | ORAL_TABLET | Freq: Two times a day (BID) | ORAL | Status: DC
  Administered 2017-09-03 – 2017-09-05 (×5): 25 mg via ORAL
  Filled 2017-09-03 (×6): qty 1

## 2017-09-03 MED ORDER — PANTOPRAZOLE SODIUM 40 MG PO TBEC
40.0000 mg | DELAYED_RELEASE_TABLET | Freq: Every day | ORAL | Status: DC
  Administered 2017-09-03 – 2017-09-04 (×2): 40 mg via ORAL
  Filled 2017-09-03 (×2): qty 1

## 2017-09-03 MED ORDER — LAMOTRIGINE 100 MG PO TABS: 50.0000 mg | ORAL_TABLET | Freq: Two times a day (BID) | ORAL | Status: DC

## 2017-09-03 MED ORDER — METOPROLOL TARTRATE 50 MG PO TABS
50.0000 mg | ORAL_TABLET | Freq: Every day | ORAL | Status: DC
  Administered 2017-09-03 – 2017-09-05 (×3): 50 mg via ORAL
  Filled 2017-09-03: qty 4
  Filled 2017-09-03 (×3): qty 1

## 2017-09-03 MED ORDER — EZETIMIBE 10 MG PO TABS
10.0000 mg | ORAL_TABLET | Freq: Every day | ORAL | Status: DC
  Administered 2017-09-03 – 2017-09-05 (×3): 10 mg via ORAL
  Filled 2017-09-03 (×3): qty 1

## 2017-09-03 MED ORDER — MIRABEGRON ER 25 MG PO TB24
25.0000 mg | ORAL_TABLET | Freq: Every day | ORAL | Status: DC
  Administered 2017-09-03 – 2017-09-05 (×3): 25 mg via ORAL
  Filled 2017-09-03 (×3): qty 1

## 2017-09-03 MED ORDER — DONEPEZIL HCL 5 MG PO TABS
5.0000 mg | ORAL_TABLET | Freq: Every day | ORAL | Status: DC
  Administered 2017-09-03 – 2017-09-05 (×3): 5 mg via ORAL
  Filled 2017-09-03 (×3): qty 1

## 2017-09-03 MED ORDER — SERTRALINE HCL 100 MG PO TABS
200.0000 mg | ORAL_TABLET | Freq: Every day | ORAL | Status: DC
  Administered 2017-09-03 – 2017-09-05 (×3): 200 mg via ORAL
  Filled 2017-09-03 (×3): qty 2

## 2017-09-03 MED ORDER — BUPROPION HCL ER (SR) 150 MG PO TB12
150.0000 mg | ORAL_TABLET | Freq: Every morning | ORAL | Status: DC
  Administered 2017-09-03 – 2017-09-05 (×3): 150 mg via ORAL
  Filled 2017-09-03 (×3): qty 1

## 2017-09-03 MED ORDER — CLOPIDOGREL BISULFATE 75 MG PO TABS
75.0000 mg | ORAL_TABLET | Freq: Every day | ORAL | Status: DC
  Administered 2017-09-03 – 2017-09-05 (×3): 75 mg via ORAL
  Filled 2017-09-03 (×3): qty 1

## 2017-09-03 MED ORDER — LAMOTRIGINE 100 MG PO TABS: 100.0000 mg | ORAL_TABLET | Freq: Two times a day (BID) | ORAL | Status: DC

## 2017-09-03 MED ORDER — ACETAMINOPHEN 325 MG PO TABS
650.0000 mg | ORAL_TABLET | Freq: Four times a day (QID) | ORAL | Status: DC | PRN
  Administered 2017-09-03 – 2017-09-05 (×5): 650 mg via ORAL
  Filled 2017-09-03 (×5): qty 2

## 2017-09-03 MED ORDER — LEVOTHYROXINE SODIUM 75 MCG PO TABS
150.0000 ug | ORAL_TABLET | Freq: Every day | ORAL | Status: DC
  Administered 2017-09-04 – 2017-09-05 (×2): 150 ug via ORAL
  Filled 2017-09-03: qty 1
  Filled 2017-09-03 (×2): qty 2

## 2017-09-03 NOTE — Progress Notes (Signed)
Name: Caroline Blake MRN: [id number] DOB: [birth year]    ADMISSION DATE:  09/01/2017 CONSULTATION DATE:  1/24  REFERRING MD :  Dr. Rory Percy  CHIEF COMPLAINT:  CVA  HISTORY OF PRESENT ILLNESS:  Patient is encephalopathic and/or intubated. Therefore history has been obtained from chart review. 72 year old female with PMH as below, which is significant for COPD and stroke. Husband reported that at baseline she is only oriented to herself with some baseline stroke and residual effects from her former stroke. Recent course complicated by a fall resulting in a fracture R clavicle (not read as such at the time) and a R lower extremity fracture (2nd/3rd/4th MT base, medial/middle/lateral cuneiform, 2nd MT neck fractures based on ED notes). She was discharged to home with plans for ortho follow up. Then 1/24 she awoke from sleep as normal but a short time after (around 930am) she developed aphasia and facial droop. She was brought to ED as a code stroke. She had CT scan negative for hemorrhage and was given tPA for presumed CVA. CTA demonstrated L M1 occlusion. She was deemed to not be a candidate for IR intervention. She became hypertensive requiring nicardipine infusion. She was admitted to ICU and PCCM asked to see.    SIGNIFICANT EVENTS  1/6 RLE fracture diagnosed 1/20 Fall, R clavicle fracture 1/24 admit for CVA, tPA given.   STUDIES:  CT head/angio/perfusion 1/24 > Distal left M1 short segment occlusion with opacified but under filled left M2 branches. This occlusion is age-indeterminate. No infarct by cerebral perfusion but there is 18cc penumbra/ischemia. No flow limiting stenosis or ulceration in the neck. The patient is wearing an implanted loop recorder. Multifocal remote MCA distribution infarcts. Age-indeterminate left thalamus lacunar infarct. Moderate to advanced right distal P2 segment stenosis. Unhealed right mid clavicle fracture with surrounding hemorrhage, favored subacute. 2 cm nodule  at the thoracic inlet that is high-density or enhancing. Question polar vessel. There is changes of thyroidectomy.    SUBJECTIVE:  Much improved, awake, alert, a bit confused but fairly oriented to situation.  Seen by palate of care, confirmed DNR/DNI.   VITAL SIGNS: Temp:  [98.5 F (36.9 C)] 98.5 F (36.9 C) (01/26 0823) Pulse Rate:  [61-91] 91 (01/25 2300) Resp:  [12-20] 16 (01/25 2300) BP: (126-158)/(60-90) 143/75 (01/25 2300) SpO2:  [85 %-100 %] 98 % (01/25 2300)  PHYSICAL EXAMINATION: General: Conically ill-appearing female, no acute distress HEENT: MM pink/moist Neuro: Awake, alert, some expressive aphasia, moves all extremities CV: s1s2 rrr, no m/r/g PULM: even/non-labored, lungs bilaterally clear ON:GEXB, non-tender, bsx4 active  Extremities: warm/dry, no edema, right shoulder with large hematoma and ecchymosis, right arm in sling, right lower extremity in cast    Recent Labs  Lab 09/01/17 1051 09/01/17 1104 09/03/17 0309  NA 135 138 145  K 3.7 3.7 4.2  CL 108 106 119*  CO2 16*  --  13*  BUN 71* 60* 58*  CREATININE 2.21* 2.20* 2.26*  GLUCOSE 126* 121* 98   Recent Labs  Lab 09/01/17 1051 09/01/17 1104 09/03/17 0309  HGB 10.6* 11.2* 10.8*  HCT 33.7* 33.0* 34.4*  WBC 10.4  --  11.7*  PLT 211  --  193   Ct Angio Head W Or Wo Contrast  Result Date: 09/01/2017 CLINICAL DATA:  Code stroke. Weakness and aphasia. Flaccid right side. EXAM: CT ANGIOGRAPHY HEAD AND NECK CT PERFUSION BRAIN TECHNIQUE: Multidetector CT imaging of the head and neck was performed using the standard protocol during bolus administration of intravenous  contrast. Multiplanar CT image reconstructions and MIPs were obtained to evaluate the vascular anatomy. Carotid stenosis measurements (when applicable) are obtained utilizing NASCET criteria, using the distal internal carotid diameter as the denominator. Multiphase CT imaging of the brain was performed following IV bolus contrast injection.  Subsequent parametric perfusion maps were calculated using RAPID software. CONTRAST:  Reference EMR COMPARISON:  None. FINDINGS: CT HEAD FINDINGS Brain: There is a large remote right MCA territory infarct affecting temporal and posterior frontal lobes. Remote left frontal operculum infarct. Remote perforator infarct with volume loss in the left basal ganglia and deep white matter tracts. Age-indeterminate lacunar infarct in the left thalamus. Chronic small vessel ischemia. No hemorrhage, hydrocephalus, or masslike finding. Vascular: See below. Skull: Negative Sinuses/Orbits: Negative Other: Negative ASPECTS ([place] Stroke Program Early CT Score) Not scored given the degree of chronic change. CTA NECK FINDINGS Aortic arch: Atherosclerotic calcification.  Three vessel branching. Right carotid system: Moderate bifurcation atheromatous plaque at the common carotid and bifurcation without ulceration or flow limiting stenosis. Negative for beading. Left carotid system: Mild-to-moderate atheromatous plaque at the common carotid bifurcation without flow limiting stenosis or ulceration. Vertebral arteries: No proximal subclavian flow limiting stenosis. Codominant vertebral arteries. No vertebral stenosis or irregularity. Skeleton: Mid right clavicle fracture that is unhealed/nonunited. Other neck: There is a 2 cm ovoid mass with possible polar artery at the central thoracic inlet. Thyroidectomy. There is thyroid ultrasound in 2015 showing goiter. Upper chest: Centrilobular emphysema. Review of the MIP images confirms the above findings CTA HEAD FINDINGS Anterior circulation: Carotid siphon atherosclerosis without flow limiting stenosis or irregularity. Short segment occlusionof the left distal M1 segment just beyond the anterior temporal branch. Left M2 branches are opacified but appear under filled compared to the right. Atherosclerotic irregularity of right M1 and M2 branches. Negative for aneurysm. Posterior circulation:  Vertebral and basilar and widely patent arteries are smooth. Hypoplastic left P1 segment. Moderate to advanced atheromatous narrowing of the distal right P2 segment. Venous sinuses: Patent as permitted by contrast timing. Anatomic variants: As above Delayed phase: Not obtained in the emergent setting Review of the MIP images confirms the above findings CT Brain Perfusion Findings: Perfusion maps have not yet reached PACS at time of signing, but rapid report was reviewed elsewhere. CBF (<30%) Volume: 52mL Perfusion (Tmax>6.0s) volume: 63mL These results were called by telephone at the time of interpretation on 09/01/2017 at 10:56 am to Dr. Amie Portland , who verbally acknowledged these results. IMPRESSION: 1. Distal left M1 short segment occlusion with opacified but under filled left M2 branches. This occlusion is age-indeterminate. No infarct by cerebral perfusion but there is 18cc penumbra/ischemia. 2. No flow limiting stenosis or ulceration in the neck. The patient is wearing an implanted loop recorder. 3. Multifocal remote MCA distribution infarcts. 4. Age-indeterminate left thalamus lacunar infarct. 5. Moderate to advanced right distal P2 segment stenosis. 6. Aortic Atherosclerosis (ICD10-I70.0) and Emphysema (ICD10-J43.9). 7. Unhealed right mid clavicle fracture with surrounding hemorrhage, favored subacute. 8. 2 cm nodule at the thoracic inlet that is high-density or enhancing. Question polar vessel. 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 Electronically Signed   By: Monte Fantasia M.D.   On: 09/01/2017 11:06   Dg Clavicle Right  Result Date: 09/02/2017 CLINICAL DATA:  Upper chest bruising EXAM: RIGHT CLAVICLE - 2+ VIEWS COMPARISON:  None. FINDINGS: Midshaft right clavicular fracture is noted with downward displacement of the distal fracture fragment. Degenerative changes of the acromioclavicular joint are seen.  The humeral head is well  seated. The underlying bony thorax is within normal limits. IMPRESSION: Midshaft right clavicular fracture Electronically Signed   By: Inez Catalina M.D.   On: 09/02/2017 11:15   Dg Ankle Complete Right  Result Date: 09/02/2017 CLINICAL DATA:  Known foot fractures EXAM: RIGHT ANKLE - COMPLETE 3+ VIEW COMPARISON:  08/14/2017 FINDINGS: Casting material limits fine bony detail. The degree of soft tissue swelling has reduced somewhat in the interval from the prior exam. Calcaneal spurring is again noted. Chronic changes about the talonavicular joint are again seen and stable. The known tarsal and metatarsal fractures are not well appreciated on this exam. IMPRESSION: No acute abnormality noted. Known foot fractures are not well appreciated on this study. Electronically Signed   By: Inez Catalina M.D.   On: 09/02/2017 11:16   Ct Head Wo Contrast  Result Date: 09/02/2017 CLINICAL DATA:  Altered mental status EXAM: CT HEAD WITHOUT CONTRAST TECHNIQUE: Contiguous axial images were obtained from the base of the skull through the vertex without intravenous contrast. COMPARISON:  Head CT 09/01/2017 FINDINGS: Brain: There is a new focus of hypoattenuation in the left superior cerebellum consistent with acute to early subacute infarct. Other sites of chronic infarction are unchanged. There is no acute hemorrhage. No midline shift or other significant mass effect. Vascular: No hyperdense vessel or unexpected calcification. Skull: Normal. Negative for fracture or focal lesion. Sinuses/Orbits: No acute finding. Other: None. IMPRESSION: 1. Acute to early subacute infarct of the left cerebellum, within the territory of the superior cerebellar artery. 2. Otherwise unchanged appearance of multiple old infarcts. 3. No acute hemorrhage, herniation or hydrocephalus. Electronically Signed   By: Ulyses Jarred M.D.   On: 09/02/2017 21:04   Ct Head Wo Contrast  Result Date: 09/01/2017 CLINICAL DATA:  Stroke.  Post tPA EXAM: CT HEAD  WITHOUT CONTRAST TECHNIQUE: Contiguous axial images were obtained from the base of the skull through the vertex without intravenous contrast. COMPARISON:  CT head and CTA head 09/01/2017 FINDINGS: Brain: Negative for acute hemorrhage Chronic infarct right temporoparietal lobe unchanged. Chronic infarct left inferior frontal lobe unchanged. Chronic infarct in the left internal capsule unchanged. Ventricle size normal. No midline shift CT perfusion abnormality in the left parietal lobe without definite CT findings of acute infarct at this time. Vascular: Intravenous contrast is noted from earlier CTA. Skull: Negative Sinuses/Orbits: Negative Other: None IMPRESSION: No acute hemorrhage following tPA. No change from head CT earlier today. Chronic ischemic changes bilaterally. Electronically Signed   By: Franchot Gallo M.D.   On: 09/01/2017 12:24   Ct Angio Neck W Or Wo Contrast  Result Date: 09/01/2017 CLINICAL DATA:  Code stroke. Weakness and aphasia. Flaccid right side. EXAM: CT ANGIOGRAPHY HEAD AND NECK CT PERFUSION BRAIN TECHNIQUE: Multidetector CT imaging of the head and neck was performed using the standard protocol during bolus administration of intravenous contrast. Multiplanar CT image reconstructions and MIPs were obtained to evaluate the vascular anatomy. Carotid stenosis measurements (when applicable) are obtained utilizing NASCET criteria, using the distal internal carotid diameter as the denominator. Multiphase CT imaging of the brain was performed following IV bolus contrast injection. Subsequent parametric perfusion maps were calculated using RAPID software. CONTRAST:  Reference EMR COMPARISON:  None. FINDINGS: CT HEAD FINDINGS Brain: There is a large remote right MCA territory infarct affecting temporal and posterior frontal lobes. Remote left frontal operculum infarct. Remote perforator infarct with volume loss in the left basal ganglia and deep white matter tracts. Age-indeterminate lacunar  infarct  in the left thalamus. Chronic small vessel ischemia. No hemorrhage, hydrocephalus, or masslike finding. Vascular: See below. Skull: Negative Sinuses/Orbits: Negative Other: Negative ASPECTS ([place] Stroke Program Early CT Score) Not scored given the degree of chronic change. CTA NECK FINDINGS Aortic arch: Atherosclerotic calcification.  Three vessel branching. Right carotid system: Moderate bifurcation atheromatous plaque at the common carotid and bifurcation without ulceration or flow limiting stenosis. Negative for beading. Left carotid system: Mild-to-moderate atheromatous plaque at the common carotid bifurcation without flow limiting stenosis or ulceration. Vertebral arteries: No proximal subclavian flow limiting stenosis. Codominant vertebral arteries. No vertebral stenosis or irregularity. Skeleton: Mid right clavicle fracture that is unhealed/nonunited. Other neck: There is a 2 cm ovoid mass with possible polar artery at the central thoracic inlet. Thyroidectomy. There is thyroid ultrasound in 2015 showing goiter. Upper chest: Centrilobular emphysema. Review of the MIP images confirms the above findings CTA HEAD FINDINGS Anterior circulation: Carotid siphon atherosclerosis without flow limiting stenosis or irregularity. Short segment occlusionof the left distal M1 segment just beyond the anterior temporal branch. Left M2 branches are opacified but appear under filled compared to the right. Atherosclerotic irregularity of right M1 and M2 branches. Negative for aneurysm. Posterior circulation: Vertebral and basilar and widely patent arteries are smooth. Hypoplastic left P1 segment. Moderate to advanced atheromatous narrowing of the distal right P2 segment. Venous sinuses: Patent as permitted by contrast timing. Anatomic variants: As above Delayed phase: Not obtained in the emergent setting Review of the MIP images confirms the above findings CT Brain Perfusion Findings: Perfusion maps have not yet  reached PACS at time of signing, but rapid report was reviewed elsewhere. CBF (<30%) Volume: 75mL Perfusion (Tmax>6.0s) volume: 105mL These results were called by telephone at the time of interpretation on 09/01/2017 at 10:56 am to Dr. Amie Portland , who verbally acknowledged these results. IMPRESSION: 1. Distal left M1 short segment occlusion with opacified but under filled left M2 branches. This occlusion is age-indeterminate. No infarct by cerebral perfusion but there is 18cc penumbra/ischemia. 2. No flow limiting stenosis or ulceration in the neck. The patient is wearing an implanted loop recorder. 3. Multifocal remote MCA distribution infarcts. 4. Age-indeterminate left thalamus lacunar infarct. 5. Moderate to advanced right distal P2 segment stenosis. 6. Aortic Atherosclerosis (ICD10-I70.0) and Emphysema (ICD10-J43.9). 7. Unhealed right mid clavicle fracture with surrounding hemorrhage, favored subacute. 8. 2 cm nodule at the thoracic inlet that is high-density or enhancing. Question polar vessel. 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 Electronically Signed   By: Monte Fantasia M.D.   On: 09/01/2017 11:06   Ct Cerebral Perfusion W Contrast  Result Date: 09/01/2017 CLINICAL DATA:  Code stroke. Weakness and aphasia. Flaccid right side. EXAM: CT ANGIOGRAPHY HEAD AND NECK CT PERFUSION BRAIN TECHNIQUE: Multidetector CT imaging of the head and neck was performed using the standard protocol during bolus administration of intravenous contrast. Multiplanar CT image reconstructions and MIPs were obtained to evaluate the vascular anatomy. Carotid stenosis measurements (when applicable) are obtained utilizing NASCET criteria, using the distal internal carotid diameter as the denominator. Multiphase CT imaging of the brain was performed following IV bolus contrast injection. Subsequent parametric perfusion maps were calculated using RAPID software.  CONTRAST:  Reference EMR COMPARISON:  None. FINDINGS: CT HEAD FINDINGS Brain: There is a large remote right MCA territory infarct affecting temporal and posterior frontal lobes. Remote left frontal operculum infarct. Remote perforator infarct with volume loss in the left basal ganglia and deep white matter  tracts. Age-indeterminate lacunar infarct in the left thalamus. Chronic small vessel ischemia. No hemorrhage, hydrocephalus, or masslike finding. Vascular: See below. Skull: Negative Sinuses/Orbits: Negative Other: Negative ASPECTS ([place] Stroke Program Early CT Score) Not scored given the degree of chronic change. CTA NECK FINDINGS Aortic arch: Atherosclerotic calcification.  Three vessel branching. Right carotid system: Moderate bifurcation atheromatous plaque at the common carotid and bifurcation without ulceration or flow limiting stenosis. Negative for beading. Left carotid system: Mild-to-moderate atheromatous plaque at the common carotid bifurcation without flow limiting stenosis or ulceration. Vertebral arteries: No proximal subclavian flow limiting stenosis. Codominant vertebral arteries. No vertebral stenosis or irregularity. Skeleton: Mid right clavicle fracture that is unhealed/nonunited. Other neck: There is a 2 cm ovoid mass with possible polar artery at the central thoracic inlet. Thyroidectomy. There is thyroid ultrasound in 2015 showing goiter. Upper chest: Centrilobular emphysema. Review of the MIP images confirms the above findings CTA HEAD FINDINGS Anterior circulation: Carotid siphon atherosclerosis without flow limiting stenosis or irregularity. Short segment occlusionof the left distal M1 segment just beyond the anterior temporal branch. Left M2 branches are opacified but appear under filled compared to the right. Atherosclerotic irregularity of right M1 and M2 branches. Negative for aneurysm. Posterior circulation: Vertebral and basilar and widely patent arteries are smooth. Hypoplastic  left P1 segment. Moderate to advanced atheromatous narrowing of the distal right P2 segment. Venous sinuses: Patent as permitted by contrast timing. Anatomic variants: As above Delayed phase: Not obtained in the emergent setting Review of the MIP images confirms the above findings CT Brain Perfusion Findings: Perfusion maps have not yet reached PACS at time of signing, but rapid report was reviewed elsewhere. CBF (<30%) Volume: 21mL Perfusion (Tmax>6.0s) volume: 52mL These results were called by telephone at the time of interpretation on 09/01/2017 at 10:56 am to Dr. Amie Portland , who verbally acknowledged these results. IMPRESSION: 1. Distal left M1 short segment occlusion with opacified but under filled left M2 branches. This occlusion is age-indeterminate. No infarct by cerebral perfusion but there is 18cc penumbra/ischemia. 2. No flow limiting stenosis or ulceration in the neck. The patient is wearing an implanted loop recorder. 3. Multifocal remote MCA distribution infarcts. 4. Age-indeterminate left thalamus lacunar infarct. 5. Moderate to advanced right distal P2 segment stenosis. 6. Aortic Atherosclerosis (ICD10-I70.0) and Emphysema (ICD10-J43.9). 7. Unhealed right mid clavicle fracture with surrounding hemorrhage, favored subacute. 8. 2 cm nodule at the thoracic inlet that is high-density or enhancing. Question polar vessel. 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 Electronically Signed   By: Monte Fantasia M.D.   On: 09/01/2017 11:06   Dg Chest Port 1 View  Result Date: 09/03/2017 CLINICAL DATA:  Respiratory failure EXAM: PORTABLE CHEST 1 VIEW COMPARISON:  09/02/2017 FINDINGS: Cardiac shadow is stable. Aortic calcifications are again seen. Loop recorder is again noted. Lungs are well aerated bilaterally. Mild interstitial changes are again identified and stable. No focal infiltrate or sizable effusion is seen. IMPRESSION: Mild interstitial  prominence stable from the previous exam. No focal infiltrate is noted. Electronically Signed   By: Inez Catalina M.D.   On: 09/03/2017 07:36   Dg Chest Port 1 View  Result Date: 09/02/2017 CLINICAL DATA:  COPD.  Hypertension.  History of TIA. EXAM: PORTABLE CHEST 1 VIEW COMPARISON:  Chest x-ray 10/21/2016. FINDINGS: Cardiac monitor device noted. Mild cardiomegaly. No evidence of pulmonary venous congestion. Mild bilateral interstitial prominence. Mild interstitial edema cannot be excluded. A component of the interstitial changes may  be chronic. No pleural effusion or pneumothorax. Surgical clips in the neck. IMPRESSION: Mild cardiomegaly. Mild bilateral interstitial prominence noted. A component these changes may be chronic. Mild interstitial edema cannot be excluded. Electronically Signed   By: Marcello Moores  Register   On: 09/02/2017 11:16   Dg Foot Complete Right  Result Date: 09/02/2017 CLINICAL DATA:  History of prior fracture EXAM: RIGHT FOOT COMPLETE - 3+ VIEW COMPARISON:  08/14/2017 FINDINGS: Casting material is noted in place limiting fine bony detail. The known fractures are not as well appreciated as on recent CT examination. Fracture of the distal aspect of the second metatarsal is well visualized. No new focal abnormality is noted. IMPRESSION: Casting material limits fine bony detail and known tarsal and metatarsal fractures are not as well appreciated on this exam. No new focal abnormality is seen. Electronically Signed   By: Inez Catalina M.D.   On: 09/02/2017 11:13   Ct Head Code Stroke Wo Contrast  Result Date: 09/01/2017 CLINICAL DATA:  Code stroke. Weakness and aphasia. Flaccid right side. EXAM: CT ANGIOGRAPHY HEAD AND NECK CT PERFUSION BRAIN TECHNIQUE: Multidetector CT imaging of the head and neck was performed using the standard protocol during bolus administration of intravenous contrast. Multiplanar CT image reconstructions and MIPs were obtained to evaluate the vascular anatomy. Carotid  stenosis measurements (when applicable) are obtained utilizing NASCET criteria, using the distal internal carotid diameter as the denominator. Multiphase CT imaging of the brain was performed following IV bolus contrast injection. Subsequent parametric perfusion maps were calculated using RAPID software. CONTRAST:  Reference EMR COMPARISON:  None. FINDINGS: CT HEAD FINDINGS Brain: There is a large remote right MCA territory infarct affecting temporal and posterior frontal lobes. Remote left frontal operculum infarct. Remote perforator infarct with volume loss in the left basal ganglia and deep white matter tracts. Age-indeterminate lacunar infarct in the left thalamus. Chronic small vessel ischemia. No hemorrhage, hydrocephalus, or masslike finding. Vascular: See below. Skull: Negative Sinuses/Orbits: Negative Other: Negative ASPECTS ([place] Stroke Program Early CT Score) Not scored given the degree of chronic change. CTA NECK FINDINGS Aortic arch: Atherosclerotic calcification.  Three vessel branching. Right carotid system: Moderate bifurcation atheromatous plaque at the common carotid and bifurcation without ulceration or flow limiting stenosis. Negative for beading. Left carotid system: Mild-to-moderate atheromatous plaque at the common carotid bifurcation without flow limiting stenosis or ulceration. Vertebral arteries: No proximal subclavian flow limiting stenosis. Codominant vertebral arteries. No vertebral stenosis or irregularity. Skeleton: Mid right clavicle fracture that is unhealed/nonunited. Other neck: There is a 2 cm ovoid mass with possible polar artery at the central thoracic inlet. Thyroidectomy. There is thyroid ultrasound in 2015 showing goiter. Upper chest: Centrilobular emphysema. Review of the MIP images confirms the above findings CTA HEAD FINDINGS Anterior circulation: Carotid siphon atherosclerosis without flow limiting stenosis or irregularity. Short segment occlusionof the left distal M1  segment just beyond the anterior temporal branch. Left M2 branches are opacified but appear under filled compared to the right. Atherosclerotic irregularity of right M1 and M2 branches. Negative for aneurysm. Posterior circulation: Vertebral and basilar and widely patent arteries are smooth. Hypoplastic left P1 segment. Moderate to advanced atheromatous narrowing of the distal right P2 segment. Venous sinuses: Patent as permitted by contrast timing. Anatomic variants: As above Delayed phase: Not obtained in the emergent setting Review of the MIP images confirms the above findings CT Brain Perfusion Findings: Perfusion maps have not yet reached PACS at time of signing, but rapid report was reviewed elsewhere. CBF (<30%) Volume: 74mL  Perfusion (Tmax>6.0s) volume: 44mL These results were called by telephone at the time of interpretation on 09/01/2017 at 10:56 am to Dr. Amie Portland , who verbally acknowledged these results. IMPRESSION: 1. Distal left M1 short segment occlusion with opacified but under filled left M2 branches. This occlusion is age-indeterminate. No infarct by cerebral perfusion but there is 18cc penumbra/ischemia. 2. No flow limiting stenosis or ulceration in the neck. The patient is wearing an implanted loop recorder. 3. Multifocal remote MCA distribution infarcts. 4. Age-indeterminate left thalamus lacunar infarct. 5. Moderate to advanced right distal P2 segment stenosis. 6. Aortic Atherosclerosis (ICD10-I70.0) and Emphysema (ICD10-J43.9). 7. Unhealed right mid clavicle fracture with surrounding hemorrhage, favored subacute. 8. 2 cm nodule at the thoracic inlet that is high-density or enhancing. Question polar vessel. 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 Electronically Signed   By: Monte Fantasia M.D.   On: 09/01/2017 11:06    ASSESSMENT / PLAN:  72yo female with hx COPD and previous stroke admitted 1/24 with acute stroke c/b HTN  and bleeding at multiple fracture sites after recent fall.   L MCA stroke  S/p tPA AMS  PLAN -  Per stroke team  BP management as below  MRI when pt more able to lie still   HTN  PLAN -  Off Cardene drip  coagulopathy s/p TPA PLAN -  Hematomas marked - monitor closely  Ortho following  ?aspiration r/t nausea/vomiting  Now on NIMVS on 09/02/2017 which may increase her risk of aspiration. Hx COPD  PLAN - Continue all nightly CPAP as per home No obvious infiltrate on chest x-ray-will DC antibiotics for now Continue to monitor WBC, fever curve off ABX Supplemental O2 as needed to keep sats greater than 92%-wears 2 L at home Follow-up chest x-ray   Okay to transfer out of ICU from [place] signing off please call back if needed   Nickolas Madrid, NP 09/03/2017  10:16 AM Pager: [phone] or [phone]

## 2017-09-03 NOTE — Progress Notes (Signed)
Daily Progress Note   Patient Name: Caroline Blake       Date: 09/03/2017 DOB: [birth year]  Age: 72 y.o. MRN#: [id number] Attending Physician: Rosalin Hawking, MD Primary Care Physician: Drake Leach, MD Admit Date: 09/01/2017  Reason for Consultation/Follow-up: Establishing goals of care and Psychosocial/spiritual support  Subjective: Patient seen, chart reviewed.  Patient was seen by speech and language on 09/02/2017 and found to have no overt signs and symptoms of aspiration.  A dysphasia 3 diet with thin liquids was recommended.  Patient also having linguistic errors that she seems unaware of.  She is now off of a Cardene drip plans are to pursue an MRI as soon as she can lie still.  Per chest x-ray, patient has an implanted loop recorder that was placed 11/16/2016 by [employer]'s  Length of Stay: 2  Current Medications: Scheduled Meds:  . buPROPion  150 mg Oral q morning - 10a  . clopidogrel  75 mg Oral Daily  . donepezil  5 mg Oral Daily  . ezetimibe  10 mg Oral Daily  . insulin aspart  0-15 Units Subcutaneous TID WC  . lamoTRIgine  25 mg Oral BID   Followed by  . [START ON 09/10/2017] lamoTRIgine  50 mg Oral BID   Followed by  . [START ON 09/17/2017] lamoTRIgine  100 mg Oral BID  . levothyroxine  150 mcg Oral QAC breakfast  . metoprolol tartrate  50 mg Oral Daily  . miconazole   Topical BID  . mirabegron ER  25 mg Oral Daily  . pantoprazole  40 mg Oral QHS  . [START ON 09/04/2017] pravastatin  80 mg Oral Daily  . sertraline  200 mg Oral Daily    Continuous Infusions: . sodium chloride 75 mL/hr at 09/02/17 1900  . niCARDipine Stopped (09/01/17 2351)    PRN Meds: acetaminophen  Physical Exam  Constitutional: She appears well-developed and well-nourished.  Older female seen in  ICU, alert and in no acute distress.  She is out of mittens  HENT:  Head: Normocephalic and atraumatic.  Neck: Normal range of motion.  Cardiovascular:  Irregular  Pulmonary/Chest: Effort normal.  Abdominal: Soft.  Neurological: She is alert.  Patient has expressive aphasia, making it difficult to ascertain her orientation.  She does seem to recognize her family, makes eye contact with those  that are speaking to her  Skin: Skin is warm and dry.  Psychiatric:  No acute agitation otherwise unable to test  Nursing note and vitals reviewed.           Vital Signs: BP (!) 143/75   Pulse 91   Temp 98.5 F (36.9 C) (Oral)   Resp 16   Ht _0  (1.626 m)   Wt 66 kg (145 lb 8.1 oz)   SpO2 98%   BMI 24.98 kg/m  SpO2: SpO2: 98 % O2 Device: O2 Device: Nasal Cannula O2 Flow Rate: O2 Flow Rate (L/min): 4 L/min  Intake/output summary:   Intake/Output Summary (Last 24 hours) at 09/03/2017 1325 Last data filed at 09/02/2017 1900 Gross per 24 hour  Intake 450 ml  Output 250 ml  Net 200 ml   LBM: Last BM Date: (UTA) Baseline Weight: Weight: 70.4 kg (155 lb 3.3 oz) Most recent weight: Weight: 66 kg (145 lb 8.1 oz)       Palliative Assessment/Data:    Flowsheet Rows     Most Recent Value  Intake Tab  Referral Department  Critical care  Unit at Time of Referral  ICU  Palliative Care Primary Diagnosis  Neurology  Date Notified  09/02/17  Reason for referral  Clarify Goals of Care  Date of Admission  09/01/17  Date first seen by Palliative Care  09/02/17  # of days Palliative referral response time  0 Day(s)  # of days IP prior to Palliative referral  1  Clinical Assessment  Palliative Performance Scale Score  30%  Pain Max last 24 hours  Not able to report  Pain Min Last 24 hours  Not able to report  Dyspnea Max Last 24 Hours  Not able to report  Dyspnea Min Last 24 hours  Not able to report  Nausea Max Last 24 Hours  Not able to report  Nausea Min Last 24 Hours  Not able to  report  Anxiety Max Last 24 Hours  Not able to report  Anxiety Min Last 24 Hours  Not able to report  Other Max Last 24 Hours  Not able to report  Psychosocial & Spiritual Assessment  Palliative Care Outcomes  Patient/Family meeting held?  Yes  Who was at the meeting?  husbnd, son and dtr  Patient/Family wishes: Interventions discontinued/not started   Mechanical Ventilation, BiPAP, Trach  Palliative Care follow-up planned  Yes, Facility      Patient Active Problem List   Diagnosis Date Noted  . Cerebral infarction ([place])   . Respiratory failure ([place])   . Goals of care, counseling/discussion   . Palliative care by specialist   . Stroke (cerebrum) ([place]) 09/01/2017    Palliative Care Assessment & Plan   Patient Profile: 71 y.o. female  with past medical history of CVA, vascular dementia, COPD, falls, admitted on 09/01/2017 with aphasia and facial droop.  She was brought to the emergency room as a code stroke.  CT scan was negative for hemorrhage and she was given TPA for a presumed CVA.  CTA demonstrated left M1 occlusion.  Husband reported that at baseline she is only oriented to herself with some baseline stroke and residual effects from her former stroke. Recent course complicated by a fall resulting in a fracture R clavicle (not read as such at the time) and a R lower extremity fracture (2nd/3rd/4th MT base, medial/middle/lateral cuneiform, 2nd MT neck fractures based on ED notes). She was discharged to home with plans for ortho  follow up  Consult ordered for goals of care.  Met with patient's husband, son, daughter for goals of care discussion on 09/02/2017.  Family states they were prepared for end-of-life but patient has rebounded and is now conversant and participating in treatment although she does have linguistic errors and elements of expressive aphasia   Recommendations/Plan:  Patient is DNR DNI  No overt signs of aspiration and a diet has been recommended by speech and  language services.  Artificial feeding was a topic of discussion for family to consider on 09/02/2017 and at that time, they were in favor of temporary feeding measures such as a core track which hopefully now she does not need.  They did not make a final decision on PEG tube.  Patient however is more alert and hopefully can participate in this level of discussion with family so in the event she were to have another event family would know what her wishes are  Anticipate at this point patient will go to a skilled nursing facility for rehab or CIR  Palliative medicine to stay involved and monitor for decline and to discuss further disposition issues such as rehab versus hospice (should she decline)  Goals of Care and Additional Recommendations:  Limitations on Scope of Treatment: A full scope of treatment except for DNR/DNI  Code Status:    Code Status Orders  (From admission, onward)        Start     Ordered   09/02/17 1052  Do not attempt resuscitation (DNR)  Continuous    Question Answer Comment  In the event of cardiac or respiratory ARREST Do not call a "code blue"   In the event of cardiac or respiratory ARREST Do not perform Intubation, CPR, defibrillation or ACLS   In the event of cardiac or respiratory ARREST Use medication by any route, position, wound care, and other measures to relive pain and suffering. May use oxygen, suction and manual treatment of airway obstruction as needed for comfort.   Comments Discussed with patients family at length. This is according to her wishes.      09/02/17 1052    Code Status History    Date Active Date Inactive Code Status Order ID Comments User Context   09/01/2017 11:31 09/01/2017 12:16 Full Code [id number]  Marliss Coots, PA-C ED       Prognosis:   Unable to determine  Discharge Planning:  [facility] for rehab with Palliative care service follow-up   Thank you for allowing the Palliative Medicine Team to assist  in the care of this patient.   Time In: 1300 Time Out: 1325 Total Time 25 min Prolonged Time Billed  no       Greater than 50%  of this time was spent counseling and coordinating care related to the above assessment and plan.  Dory Horn, NP  Please contact Palliative Medicine Team phone at [phone] for questions and concerns.

## 2017-09-03 NOTE — Progress Notes (Signed)
RN called [employer] @ [phone] to follow up on information regarding the device and the stent' which the patient husband states was placed by the same doctor. Representative stated that the only information that they have is for the loop recorder, and not a stent. MRI representatives wouldn't be available till Monday  If we  had further questions.

## 2017-09-03 NOTE — Progress Notes (Signed)
New Admission Note:  Arrival Method: Arrived on the bed Mental Orientation: alert x 1 Telemetry: Assessment: Completed Skin: bruising on both arms bilaterally IV: L forearm/ Saline locked  Pain:0/10 Safety Measures: Safety Fall Prevention Plan was given, discussed. Admission: Completed 3W26: Patient has been orientated to the room, unit and the staff. Family:son and husband at the bedside.   Orders have been reviewed and implemented. Will continue to monitor the patient. Call light has been placed within reach and bed alarm has been activated.   Arta Silence ,RN

## 2017-09-03 NOTE — Progress Notes (Signed)
NEUROHOSPITALISTS STROKE TEAM - DAILY PROGRESS NOTE   SUBJECTIVE (INTERVAL HISTORY) Husband and son and daughter are at the bedside. Patient is found laying in bed in NAD, she is sleepy but no agitation. Disorientated to place and time. Family denies any weakness or numbness, but gibberish speech on stroke onset, now much improved. Off cardene drip, will do MRI brain. Family so far refused anticoagulation given hx of GIB, but would like to discuss further with pt cardiologist. She also has Neurologist Dr. Sherrine Maples to follow as out pt.      OBJECTIVE Lab Results: CBC:  Recent Labs  Lab 09/01/17 1051 09/01/17 1104 09/03/17 0309  WBC 10.4  --  11.7*  HGB 10.6* 11.2* 10.8*  HCT 33.7* 33.0* 34.4*  MCV 80.6  --  81.9  PLT 211  --  193   BMP: Recent Labs  Lab 09/01/17 1035 09/01/17 1051 09/01/17 1104 09/02/17 1000 09/03/17 0309  NA 141 135 138  --  145  K 4.0 3.7 3.7  --  4.2  CL 110 108 106  --  119*  CO2  --  16*  --   --  13*  GLUCOSE 128* 126* 121*  --  98  BUN 67* 71* 60*  --  58*  CREATININE 2.40* 2.21* 2.20*  --  2.26*  CALCIUM  --  6.0*  --   --  6.7*  MG  --   --   --  1.7 1.7  PHOS  --   --   --  6.6* 5.7*   Liver Function Tests:  Recent Labs  Lab 09/01/17 1051  AST 15  ALT 10*  ALKPHOS 85  BILITOT 0.5  PROT 4.9*  ALBUMIN 2.5*   Coagulation Studies:  Recent Labs    09/01/17 1051  APTT 28  INR 1.29   Urinalysis:  Recent Labs  Lab 09/01/17 1123  COLORURINE YELLOW  APPEARANCEUR CLEAR  LABSPEC 1.020  PHURINE 5.0  GLUCOSEU NEGATIVE  HGBUR SMALL*  BILIRUBINUR NEGATIVE  KETONESUR NEGATIVE  PROTEINUR NEGATIVE  NITRITE NEGATIVE  LEUKOCYTESUR NEGATIVE   Urine Drug Screen:     Component Value Date/Time   LABOPIA NONE DETECTED 09/01/2017 1123   COCAINSCRNUR NONE DETECTED 09/01/2017 1123   LABBENZ NONE DETECTED 09/01/2017 1123   AMPHETMU NONE DETECTED 09/01/2017 1123   THCU NONE DETECTED  09/01/2017 1123   LABBARB NONE DETECTED 09/01/2017 1123    Alcohol Level:  Recent Labs  Lab 09/01/17 1015 09/01/17 1051  ETH <10 <10    PHYSICAL EXAM Temp:  [98.5 F (36.9 C)] 98.5 F (36.9 C) (01/26 0017) Pulse Rate:  [61-91] 91 (01/25 2300) Resp:  [12-20] 16 (01/25 2300) BP: (102-158)/(57-93) 143/75 (01/25 2300) SpO2:  [85 %-100 %] 98 % (01/25 2300) General - sleepy but about to arouse HEENT-  Normocephalic,  Cardiovascular - irregularly irregular heart rate and rhythm.  Respiratory - Lungs clear bilaterally. No wheezing. Abdomen - soft and non-tender, BS normal Skin/muscular skeletal-large bruise over the right shoulder.  Right leg in bandage. Neurological exam Sleepy but arousable, orientated to people, month, self and age, but not to place or year. No significant aphasia but with intermittent paraphasic errors. PERRL, blinking to visual threat bilaterally. Eyes moving both directions. No facial asymmetry, tongue in middle. RUE in sling, pain at proximal movement. Distal hand grip 4/5. LUE 4/5 at least. RLE on splint, but BLE 4/5 on movement. RUE slow on FTN but no ataxia. LUE not tested for ataxia due to  fracture. Sensation symmetrical. Gait not tested.   IMAGING: I have personally reviewed the radiological images below and agree with the radiology interpretations.  CT Head Code Stroke Ct Angio Head and Neck  CT Perfusion W Or Wo Contrast 09/01/2017 IMPRESSION:  1. Distal left M1 short segment occlusion with opacified but under filled left M2 branches. This occlusion is age-indeterminate. No infarct by cerebral perfusion but there is 18cc penumbra/ischemia.  2. No flow limiting stenosis or ulceration in the neck. The patient is wearing an implanted loop recorder.  3. Multifocal remote MCA distribution infarcts.  4. Age-indeterminate left thalamus lacunar infarct.  5. Moderate to advanced right distal P2 segment stenosis.  6. Aortic Atherosclerosis (ICD10-I70.0) and  Emphysema (ICD10-J43.9).  7. Unhealed right mid clavicle fracture with surrounding hemorrhage, favored subacute.  8. 2 cm nodule at the thoracic inlet that is high-density or enhancing. Question polar vessel. 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   Ct Head Wo Contrast 09/01/2017 IMPRESSION:  No acute hemorrhage following tPA. No change from head CT earlier today. Chronic ischemic changes bilaterally.   MRI Head - PENDING   CT Head Wo Contrast 09/02/2017 IMPRESSION: 1. Acute to early subacute infarct of the left cerebellum, within the territory of the superior cerebellar artery. 2. Otherwise unchanged appearance of multiple old infarcts. 3. No acute hemorrhage, herniation or hydrocephalus.                                                      Echocardiogram 09/02/2017       Study Conclusions - Left ventricle: The cavity size was normal. Systolic function was   normal. The estimated ejection fraction was in the range of 55%   to 60%. Wall motion was normal; there were no regional wall   motion abnormalities. Doppler parameters are consistent with   abnormal left ventricular relaxation (grade 1 diastolic   dysfunction).                                             IMPRESSION: [name] is a 72 y.o. female with PMH of TIAs, cognitive deficits, COPD, tobacco abuse, hypertension, and not very compliant with medications or doctor visits, presents for evaluation of acute onset of right-sided weakness and aphasia. Exam is consistent with a left MCA syndrome.  IV TPA administered, not an endovascular candidate for thrombectomy. DELAYS IN THE PROCESS: Major delay in administering TPA due to incorrect time of last known normal provided by EMS, but still within under 4 and half hours from symptom onset.     Likely Cerebral infarction due to embolism of left middle cerebral artery   Suspected Etiology: small vessel vs  athero vs cardioembolic source. Resultant Symptoms: right-sided weakness and aphasia, agitation Stroke Risk Factors: hypertension and smoking Other Stroke Risk Factors: Advanced age, Cigarette smoker, Hx stroke/TIA  Outstanding Stroke Work-up Studies:     MRI Head - PENDING (pt agitated)   PLAN  09/03/2017: Continue Statin and Zetia prior to discharge HOLD ASA until 24 hour post tPA neuroimaging is stable & without evidence of bleeding Frequent neuro checks Telemetry monitoring PT/OT/SLP Consult PM & Rehab Consult Case Management /MSW Has  Loop, requested interrogation Ongoing aggressive stroke risk factor management Patient will be counseled to be compliant with her antithrombotic medications Patient will be counseled on Lifestyle modifications including, Diet, Exercise, and Stress Follow up with Dr [facility] in 4 weeks  HX OF STROKES: Remote MCA distribution infarcts  MEDICAL ISSUES:  CNS Close neuro monitoring  Agitation -Use one-time dose of Ativan 0.5 mg IV -Consider using Seroquel if absolutely needed.   -Avoid using any sedating medications, if possible  RESP -No acute issues for now -Protecting her airway -Consulted pulmonary critical care medicine for help with medical management.  CV HYPERTENSION: Stable SBP goal of < 180. DBP goal of < 105.  Nicardipine drip, Labetolol PRN Long term BP goal normotensive. May slowly restart home B/P medications after 48 hours Home Meds: Norvasc, Lopressor, Inderal Echocardiogram - EF 55-60%. No cardiac source of emboli identified.  R/O AFIB: Loop Recorder interrogation in progress  HEME Iron Deficiency Anemia - Hb 10.8 ; Hct 34.4 -Monitor -transfuse for hgb < 7 -Trend PT/PTT/INR  GI/GU Cr elevated - BUN 58 ; Creatinine 2.26 Unclear baseline. No records -Gentle hydration -avoid nephrotoxic agents Appreciate PCCM cons  DYSPHAGIA: NPO until passes SLP swallow evaluation Aspiration  Precautions in progress  Fluid/Electrolyte Disorders  -Replete Calcium - in progress -Repeat labs, Ionized calcium  - pending  Ext Rt clavicle fracture and Right foot fracture. Immobilize with splint for now. -Ortho consulted for clavicle and Right foot fracture -Xrays ordered  2 cm nodule at the thoracic inlet Outpatient ENT/Endocrine surveillance   HYPERLIPIDEMIA:    Component Value Date/Time   CHOL 156 09/02/2017 0212   TRIG 41 09/02/2017 0212   HDL 47 09/02/2017 0212   CHOLHDL 3.3 09/02/2017 0212   VLDL 8 09/02/2017 0212   LDLCALC 101 (H) 09/02/2017 0212  Home Meds:  Pravachol 80mg  LDL  goal < 70 Cannot change statin to  Lipitor to 80 mg due to statin allergy Will continue Pravachol 80 mg at discharge and Zetia 10 mg   PRE-DIABETES: Lab Results  Component Value Date   HGBA1C 6.5 (H) 09/02/2017  HgbA1c goal < 7.0 Currently on: Novolog Continue CBG monitoring and SSI to maintain glucose 140-180 mg/dl DM education   TOBACCO ABUSE  Current smoker Smoking cessation counseling provided Nicotine patch provided  PALLIATIVE CARE CONSULT  DNR / DNI  "Anticipate at this point patient will go to a skilled nursing facility for rehab or CIR  Palliative medicine to stay involved and monitor for decline and to discuss further  disposition issues such as rehab versus hospice (should she decline)"   Other Active Problems: Active Problems:   Stroke (cerebrum) ([facility])   Cerebral infarction ([place])   Respiratory failure ([place])   Goals of care, counseling/discussion   Palliative care by specialist    Hospital day # 2 VTE prophylaxis: SCD's  Diet : Check puncture sites for bleeding or hematomas. Bleeding precautions Fall precautions DIET DYS 3 Room service appropriate? Yes; Fluid consistency: Thin   FAMILY UPDATES: No family at bedside  TEAM UPDATES: Melvenia Beam, MD      Prior Home Stroke Medications:  clopidogrel 75 mg daily  Discharge Stroke Meds:  Please  discharge patient on TBD   Disposition: pending Therapy Recs:  SNF  Follow Up:  Follow-up Information    Ernest Haber. Schedule an appointment as soon as possible for a visit in 6 week(s).   Specialty:  Neurology Contact information: [address] [place] [address] [phone]  Drake Leach, MD -PCP Follow up in 1-2 weeks       Attending Note:  I reviewed above note and agree with the assessment and plan. I have made any additions or clarifications directly to the above note. Pt was seen and examined.   72 year old female who has a very complicated past medical history.  She has a history of CAD status post stent, COPD, smoker, hypertension, kidney cancer status post unilateral nephrectomy.  She also has history of a stroke with right temporal and left subcortical infarcts.  Her loop recorder inserted and found to have A. fib in 09/2016.  Put on Xarelto.  However, patient started to have GI bleeding with anemia requiring for blood transfusion.  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  No more GI bleeding since summer.  However, Xarelto discontinued, currently on Plavix PTA.  She also has a seizure in 04/2013 and was on Lamictal 100 mg twice daily since.  She also has vascular and Alzheimer dementia has been following with Dr. Ernest Haber in 05/28/17 and 07/28/17 and was put on Aricept.  She had a fall in 08/2017 with fracture of right clavicle and right foot.  Currently on right shoulder sling and right leg splint.  This time patient was admitted for slurred speech, gibberish speech.  CT no bleeding.  But old right MCA and left thalamic infarcts, status post TPA.  CTA head and neck showed left M1 distal chronic occlusion, left M2 collateral flow, right P2 stenosis.  Repeat CT showed acute left cerebellum infarcts.  EF 55-60%.  LDL 101 and A1c 6.5. Had agitation overnight, not able to get MRI.  So  far MRI pending.  Resume aspirin, pravastatin and other home meds.  Discussed with husband and son and family at bedside regarding long-term anticoagulation.  They are concerning about the GI bleeding in the past, would like to continue Plavix at this time, and will follow-up with cardiology as outpatient to decide further anticoagulation.  She will also follow with neurology Dr. Ernest Haber after discharge.  Rosalin Hawking, MD PhD Stroke Neurology 09/03/2017 5:38 PM  This patient is critically ill due to acute infarct in the setting of A. fib not on anticoagulation and at significant risk of neurological worsening, death form recurrent stroke, hemorrhagic conversion, heart failure, seizure, behavior disturbance. This patient's care requires constant monitoring of vital signs, hemodynamics, respiratory and cardiac monitoring, review of multiple databases, neurological assessment, discussion with family, other specialists and medical decision making of high complexity. I had long discussion with husband and other family members at bedside, updated pt current condition, treatment plan and potential prognosis. They expressed understanding and appreciation. I also discussed with Dr. Lake Bells. I spent 45 minutes of neurocritical care time in the care of this patient.   To contact Stroke Continuity provider, please refer to [email or web address] After hours, contact General Neurology

## 2017-09-03 NOTE — Progress Notes (Signed)
Patient ID: Caroline Blake, female   DOB: 1946/04/12, 72 y.o.   MRN: [id number]  Pt with R clavicle fx and multiple R foot fx with Lisfranc injury. She was seen by another ortho group previously and RLE was splinted. Would recommend she remains NWB RLE and RUE, remain in splint RLE and sling RUE.   Per Dr. Lyla Glassing recommend follow up with previously treating ortho surgeon upon D/C.  Ortho will sign off, please contact us with any other concerns. Discussed plan with Dr. Lyla Glassing this morning.

## 2017-09-03 NOTE — Progress Notes (Signed)
Patient moved to [facility] room 326. Report called, family present, Patient stable. No issues while transporting patient did well.

## 2017-09-04 ENCOUNTER — Inpatient Hospital Stay (HOSPITAL_COMMUNITY): Payer: Medicare Other

## 2017-09-04 ENCOUNTER — Encounter (HOSPITAL_COMMUNITY): Payer: Self-pay | Admitting: Radiology

## 2017-09-04 DIAGNOSIS — Z8719 Personal history of other diseases of the digestive system: Secondary | ICD-10-CM

## 2017-09-04 DIAGNOSIS — I6340 Cerebral infarction due to embolism of unspecified cerebral artery: Secondary | ICD-10-CM

## 2017-09-04 LAB — CBC
HCT: 32.4 % — ABNORMAL LOW (ref 36.0–46.0)
HEMOGLOBIN: 10.2 g/dL — AB (ref 12.0–15.0)
MCH: 25.9 pg — ABNORMAL LOW (ref 26.0–34.0)
MCHC: 31.5 g/dL (ref 30.0–36.0)
MCV: 82.2 fL (ref 78.0–100.0)
Platelets: 161 10*3/uL (ref 150–400)
RBC: 3.94 MIL/uL (ref 3.87–5.11)
RDW: 17 % — ABNORMAL HIGH (ref 11.5–15.5)
WBC: 10.1 10*3/uL (ref 4.0–10.5)

## 2017-09-04 LAB — GLUCOSE, CAPILLARY
GLUCOSE-CAPILLARY: 106 mg/dL — AB (ref 65–99)
GLUCOSE-CAPILLARY: 114 mg/dL — AB (ref 65–99)
Glucose-Capillary: 122 mg/dL — ABNORMAL HIGH (ref 65–99)
Glucose-Capillary: 93 mg/dL (ref 65–99)

## 2017-09-04 LAB — BASIC METABOLIC PANEL
ANION GAP: 9 (ref 5–15)
BUN: 55 mg/dL — ABNORMAL HIGH (ref 6–20)
CALCIUM: 6.8 mg/dL — AB (ref 8.9–10.3)
CO2: 19 mmol/L — ABNORMAL LOW (ref 22–32)
Chloride: 115 mmol/L — ABNORMAL HIGH (ref 101–111)
Creatinine, Ser: 2.39 mg/dL — ABNORMAL HIGH (ref 0.44–1.00)
GFR, EST AFRICAN AMERICAN: 23 mL/min — AB (ref >60)
GFR, EST NON AFRICAN AMERICAN: 19 mL/min — AB (ref >60)
Glucose, Bld: 133 mg/dL — ABNORMAL HIGH (ref 65–99)
POTASSIUM: 4.2 mmol/L (ref 3.5–5.1)
SODIUM: 143 mmol/L (ref 135–145)

## 2017-09-04 LAB — CALCIUM, IONIZED: Calcium, Ionized, Serum: 3.9 mg/dL — ABNORMAL LOW (ref 4.5–5.6)

## 2017-09-04 MED ORDER — DOXYCYCLINE HYCLATE 100 MG PO CAPS: 100.0000 mg | ORAL_CAPSULE | Freq: Two times a day (BID) | ORAL | Status: DC

## 2017-09-04 MED ORDER — DOCUSATE SODIUM 100 MG PO CAPS
100.0000 mg | ORAL_CAPSULE | Freq: Two times a day (BID) | ORAL | Status: DC
  Administered 2017-09-04 – 2017-09-05 (×3): 100 mg via ORAL
  Filled 2017-09-04 (×3): qty 1

## 2017-09-04 MED ORDER — DOXYCYCLINE HYCLATE 100 MG PO TABS
100.0000 mg | ORAL_TABLET | Freq: Two times a day (BID) | ORAL | Status: DC
  Administered 2017-09-04 – 2017-09-05 (×3): 100 mg via ORAL
  Filled 2017-09-04 (×3): qty 1

## 2017-09-04 NOTE — NC FL2 (Signed)
[place] MEDICAID FL2 LEVEL OF CARE SCREENING TOOL     IDENTIFICATION  Patient Name: Caroline Blake Birthdate: Jul 17, 1946 Sex: female Admission Date (Current Location): 09/01/2017  [facility] and [place] Number:  Herbalist and Address:  [facility] [place], [place] [address] [place], [place] [address]      Provider Number: [id number]  Attending Physician Name and Address:  Rosalin Hawking, MD  Relative Name and Phone Number:       Current Level of Care: Hospital Recommended Level of Care: [place] Prior Approval Number:    Date Approved/Denied:   PASRR Number: [id number]  Discharge Plan: SNF    Current Diagnoses: Patient Active Problem List   Diagnosis Date Noted  . Paroxysmal atrial fibrillation (HCC)   . History of stroke   . History of seizure   . Vascular dementia with behavior disturbance   . Cerebral infarction ([place])   . Respiratory failure ([place])   . Goals of care, counseling/discussion   . Palliative care by specialist   . Stroke (cerebrum) ([place]) 09/01/2017    Orientation RESPIRATION BLADDER Height & Weight     Place, Self  Normal Incontinent, External catheter(Placed 1/25) Weight: 145 lb 8.1 oz (66 kg) Height:  5\' 4"  (162.6 cm)  BEHAVIORAL SYMPTOMS/MOOD NEUROLOGICAL BOWEL NUTRITION STATUS      Incontinent (Please see d/c summary)  AMBULATORY STATUS COMMUNICATION OF NEEDS Skin   Total Care   Normal                       Personal Care Assistance Level of Assistance  Bathing, Feeding, Dressing Bathing Assistance: Maximum assistance Feeding assistance: Limited assistance Dressing Assistance: Maximum assistance     Functional Limitations Info  Sight, Hearing, Speech Sight Info: Adequate Hearing Info: Adequate Speech Info: Adequate(Clear;Incomprehensible;Nods/gestures appropriately)    SPECIAL CARE FACTORS FREQUENCY  PT (By licensed PT), OT (By licensed OT)     PT Frequency: 3x OT Frequency: 3x            Contractures Contractures Info: Not present    Additional Factors Info  Code Status, Allergies Code Status Info: DNR Allergies Info: Atorvastatin, Carvedilol, Cinoxacin, Etodolac, Metoclopramide, Moxifloxacin, Paroxetine Hcl, Rosuvastatin Calcium, Nitrofurantoin, Rivaroxaban, Sulfa Antibiotics, Yellow Dyes (Non-tartrazine), Aspirin-dipyridamole Er, Pitavastatin           Current Medications (09/04/2017):  This is the current hospital active medication list Current Facility-Administered Medications  Medication Dose Route Frequency Provider Last Rate Last Dose  . 0.9 %  sodium chloride infusion   Intravenous Continuous Rosalin Hawking, MD 75 mL/hr at 09/02/17 1900    . acetaminophen (TYLENOL) tablet 650 mg  650 mg Oral Q6H PRN Jule Ser, DO   650 mg at 09/04/17 0758  . buPROPion (WELLBUTRIN SR) 12 hr tablet 150 mg  150 mg Oral q morning - 10a Rosalin Hawking, MD   150 mg at 09/04/17 1000  . clopidogrel (PLAVIX) tablet 75 mg  75 mg Oral Daily Rosalin Hawking, MD   75 mg at 09/04/17 1000  . docusate sodium (COLACE) capsule 100 mg  100 mg Oral BID Rosalin Hawking, MD   100 mg at 09/04/17 1530  . donepezil (ARICEPT) tablet 5 mg  5 mg Oral Daily Rosalin Hawking, MD   5 mg at 09/04/17 1000  . doxycycline (VIBRA-TABS) tablet 100 mg  100 mg Oral BID Rosalin Hawking, MD   100 mg at 09/04/17 1200  . ezetimibe (ZETIA) tablet 10 mg  10 mg Oral Daily Rosalin Hawking, MD   10 mg at 09/04/17 1000  . insulin aspart (novoLOG) injection 0-15 Units  0-15 Units Subcutaneous TID WC Greta Doom, MD   2 Units at 09/03/17 1730  . lamoTRIgine (LAMICTAL) tablet 25 mg  25 mg Oral BID Rosalin Hawking, MD   25 mg at 09/04/17 1000   Followed by  . [START ON 09/10/2017] lamoTRIgine (LAMICTAL) tablet 50 mg  50 mg Oral BID Rosalin Hawking, MD       Followed by  . [START ON 09/17/2017] lamoTRIgine (LAMICTAL) tablet 100 mg  100 mg Oral BID Rosalin Hawking, MD      . levothyroxine (SYNTHROID, LEVOTHROID) tablet 150 mcg  150 mcg Oral QAC  breakfast Rosalin Hawking, MD   150 mcg at 09/04/17 0754  . metoprolol tartrate (LOPRESSOR) tablet 50 mg  50 mg Oral Daily Rosalin Hawking, MD   50 mg at 09/04/17 1000  . miconazole (MICOTIN) 2 % cream   Topical BID Amie Portland, MD      . mirabegron [facility]) tablet 25 mg  25 mg Oral Daily Rosalin Hawking, MD   25 mg at 09/04/17 1000  . nicardipine (CARDENE) 20mg  in 0.86% saline 256ml IV infusion (0.1 mg/ml)  3-15 mg/hr Intravenous Continuous Amie Portland, MD   Stopped at 09/01/17 2351  . pantoprazole (PROTONIX) EC tablet 40 mg  40 mg Oral QHS Rumbarger, Valeda Malm, RPH   40 mg at 09/03/17 2201  . pravastatin (PRAVACHOL) tablet 80 mg  80 mg Oral Daily Rosalin Hawking, MD   80 mg at 09/04/17 1000  . sertraline (ZOLOFT) tablet 200 mg  200 mg Oral Daily Rosalin Hawking, MD   200 mg at 09/04/17 1000     Discharge Medications: Please see discharge summary for a list of discharge medications.  Relevant Imaging Results:  Relevant Lab Results:   Additional Information SSN: [id number]  Eileen Stanford, LCSW

## 2017-09-04 NOTE — Progress Notes (Signed)
NEUROHOSPITALISTS STROKE TEAM - DAILY PROGRESS NOTE   SUBJECTIVE (INTERVAL HISTORY) Son and daughter are at the bedside. No acute event overnight. Had MRI this morning showed cardioembolic infarcts consistent with afib not on AC. Pending SNF placement. Ortho consult no procedure to be done and follow up with previous ortho group.       OBJECTIVE Lab Results: CBC:  Recent Labs  Lab 09/01/17 1051 09/01/17 1104 09/03/17 0309 09/04/17 0235  WBC 10.4  --  11.7* 10.1  HGB 10.6* 11.2* 10.8* 10.2*  HCT 33.7* 33.0* 34.4* 32.4*  MCV 80.6  --  81.9 82.2  PLT 211  --  193 161   BMP: Recent Labs  Lab 09/01/17 1035 09/01/17 1051 09/01/17 1104 09/02/17 1000 09/03/17 0309 09/04/17 0235  NA 141 135 138  --  145 143  K 4.0 3.7 3.7  --  4.2 4.2  CL 110 108 106  --  119* 115*  CO2  --  16*  --   --  13* 19*  GLUCOSE 128* 126* 121*  --  98 133*  BUN 67* 71* 60*  --  58* 55*  CREATININE 2.40* 2.21* 2.20*  --  2.26* 2.39*  CALCIUM  --  6.0*  --   --  6.7* 6.8*  MG  --   --   --  1.7 1.7  --   PHOS  --   --   --  6.6* 5.7*  --    Liver Function Tests:  Recent Labs  Lab 09/01/17 1051  AST 15  ALT 10*  ALKPHOS 85  BILITOT 0.5  PROT 4.9*  ALBUMIN 2.5*   Coagulation Studies:  No results for input(s): APTT, INR in the last 72 hours. Urinalysis:  Recent Labs  Lab 09/01/17 1123  COLORURINE YELLOW  APPEARANCEUR CLEAR  LABSPEC 1.020  PHURINE 5.0  GLUCOSEU NEGATIVE  HGBUR SMALL*  BILIRUBINUR NEGATIVE  KETONESUR NEGATIVE  PROTEINUR NEGATIVE  NITRITE NEGATIVE  LEUKOCYTESUR NEGATIVE   Urine Drug Screen:     Component Value Date/Time   LABOPIA NONE DETECTED 09/01/2017 1123   COCAINSCRNUR NONE DETECTED 09/01/2017 1123   LABBENZ NONE DETECTED 09/01/2017 1123   AMPHETMU NONE DETECTED 09/01/2017 1123   THCU NONE DETECTED 09/01/2017 1123   LABBARB NONE DETECTED 09/01/2017 1123    Alcohol Level:  Recent Labs  Lab  09/01/17 1015 09/01/17 1051  ETH <10 <10    PHYSICAL EXAM Temp:  [97.6 F (36.4 C)-98.7 F (37.1 C)] 98.7 F (37.1 C) (01/27 1250) Pulse Rate:  [55-67] 55 (01/27 1250) Resp:  [15-20] 20 (01/27 1250) BP: (127-155)/(69-81) 134/69 (01/27 1250) SpO2:  [96 %-100 %] 100 % (01/27 1250) General - sleepy but about to arouse HEENT-  Normocephalic,  Cardiovascular - irregularly irregular heart rate and rhythm.  Respiratory - Lungs clear bilaterally. No wheezing. Abdomen - soft and non-tender, BS normal Skin/muscular skeletal-large bruise over the right shoulder.  Right leg in bandage. Neurological exam Awake alert, orientated to people, month, self and age, but not to place or year. No significant aphasia but with intermittent paraphasic errors. PERRL, blinking to visual threat bilaterally. Eyes moving both directions. No facial asymmetry, tongue in middle. RUE in sling, pain at proximal movement. Distal hand grip 4/5. LUE 4/5 at least. RLE on splint, but BLE 4/5 on movement. RUE slow on FTN but no ataxia. LUE not tested for ataxia due to fracture. Sensation symmetrical. Gait not tested.   IMAGING: I have personally reviewed the radiological images below  and agree with the radiology interpretations.  CT Head Code Stroke Ct Angio Head and Neck  CT Perfusion W Or Wo Contrast 09/01/2017 IMPRESSION:  1. Distal left M1 short segment occlusion with opacified but under filled left M2 branches. This occlusion is age-indeterminate. No infarct by cerebral perfusion but there is 18cc penumbra/ischemia.  2. No flow limiting stenosis or ulceration in the neck. The patient is wearing an implanted loop recorder.  3. Multifocal remote MCA distribution infarcts.  4. Age-indeterminate left thalamus lacunar infarct.  5. Moderate to advanced right distal P2 segment stenosis.  6. Aortic Atherosclerosis (ICD10-I70.0) and Emphysema (ICD10-J43.9).  7. Unhealed right mid clavicle fracture with surrounding  hemorrhage, favored subacute.  8. 2 cm nodule at the thoracic inlet that is high-density or enhancing. Question polar vessel. 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   Ct Head Wo Contrast 09/01/2017 IMPRESSION:  No acute hemorrhage following tPA. No change from head CT earlier today. Chronic ischemic changes bilaterally.   MRI Head Wo Contrast 09/04/2017 IMPRESSION: Acute infarctions in the anterior and posterior circulation suggesting embolic disease from the heart or ascending aorta.  Acute infarctions are most prominently in the left superior cerebellum and left insula and parietal region with other punctate acute infarctions scattered within the left insula and parietal regions and within the right cerebellum and left posteromedial temporal lobe.  Extensive chronic ischemic changes elsewhere throughout the brain including an old infarction in the right insula and temporoparietal region.   CT Head Wo Contrast 09/02/2017 IMPRESSION: 1. Acute to early subacute infarct of the left cerebellum, within the territory of the superior cerebellar artery. 2. Otherwise unchanged appearance of multiple old infarcts. 3. No acute hemorrhage, herniation or hydrocephalus.                                                      Echocardiogram 09/02/2017       Study Conclusions - Left ventricle: The cavity size was normal. Systolic function was   normal. The estimated ejection fraction was in the range of 55%   to 60%. Wall motion was normal; there were no regional wall   motion abnormalities. Doppler parameters are consistent with   abnormal left ventricular relaxation (grade 1 diastolic   dysfunction).                                             IMPRESSION: [name] is a 72 y.o. female with PMH of TIAs, cognitive deficits, COPD, tobacco abuse, hypertension, and not very compliant with medications or doctor visits,  presents for evaluation of acute onset of right-sided weakness and aphasia. Exam is consistent with a left MCA syndrome.  IV TPA administered, not an endovascular candidate for thrombectomy. DELAYS IN THE PROCESS: Major delay in administering TPA due to incorrect time of last known normal provided by EMS, but still within under 4 and half hours from symptom onset.     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 Resultant Symptoms: right-sided weakness and aphasia, agitation Stroke Risk Factors: hypertension and smoking Other Stroke Risk Factors: Advanced  age, Cigarette smoker, Hx stroke/TIA  Outstanding Stroke Work-up Studies:     MRI Head - Multiple embolic infarcts as noted above. Old infarcts as well.   PLAN  09/04/2017: Continue Statin and Zetia prior to discharge HOLD ASA until 24 hour post tPA neuroimaging is stable & without evidence of bleeding Frequent neuro checks Telemetry monitoring PT/OT/SLP Consult PM & Rehab Consult Case Management /MSW Has Loop, requested interrogation Ongoing aggressive stroke risk factor management Patient will be counseled to be compliant with her antithrombotic medications Patient will be counseled on Lifestyle modifications including, Diet, Exercise, and Stress Follow up with Dr [facility] in 4 weeks  HX OF STROKES: Remote MCA distribution infarcts  MEDICAL ISSUES:  CNS Close neuro monitoring  Agitation -Use one-time dose of Ativan 0.5 mg IV -Consider using Seroquel if absolutely needed.   -Avoid using any sedating medications, if possible  RESP -No acute issues for now -Protecting her airway -Consulted pulmonary critical care medicine for help with medical management.  CV HYPERTENSION: Stable SBP goal of < 180. DBP goal of < 105.  Nicardipine drip, Labetolol PRN Long term BP goal normotensive. May slowly  restart home B/P medications after 48 hours Home Meds: Norvasc, Lopressor, Inderal Echocardiogram - EF 55-60%. No cardiac source of emboli identified.  R/O AFIB: Loop Recorder interrogation in progress  HEME Iron Deficiency Anemia - Hb 10.2 ; Hct 32.4 -Monitor -transfuse for hgb < 7 -Trend PT/PTT/INR  GI/GU Cr elevated - BUN 55 ; Creatinine 2.39 Unclear baseline. No records -Gentle hydration -avoid nephrotoxic agents Appreciate PCCM cons  DYSPHAGIA: NPO until passes SLP swallow evaluation Aspiration Precautions in progress  Fluid/Electrolyte Disorders  -Replete Calcium - in progress -Repeat labs, Ionized calcium  - pending  Ext Rt clavicle fracture and Right foot fracture. Immobilize with splint for now. -Ortho consulted for clavicle and Right foot fracture -Xrays ordered  2 cm nodule at the thoracic inlet Outpatient ENT/Endocrine surveillance   HYPERLIPIDEMIA:    Component Value Date/Time   CHOL 156 09/02/2017 0212   TRIG 41 09/02/2017 0212   HDL 47 09/02/2017 0212   CHOLHDL 3.3 09/02/2017 0212   VLDL 8 09/02/2017 0212   LDLCALC 101 (H) 09/02/2017 0212  Home Meds:  Pravachol 80mg  LDL  goal < 70 Cannot change statin to  Lipitor to 80 mg due to statin allergy Will continue Pravachol 80 mg at discharge and Zetia 10 mg   PRE-DIABETES: Lab Results  Component Value Date   HGBA1C 6.5 (H) 09/02/2017  HgbA1c goal < 7.0 Currently on: Novolog Continue CBG monitoring and SSI to maintain glucose 140-180 mg/dl DM education   TOBACCO ABUSE  Current smoker Smoking cessation counseling provided Nicotine patch provided  PALLIATIVE CARE CONSULT  DNR / DNI  "Anticipate at this point patient will go to a skilled nursing facility for rehab or CIR  Palliative medicine to stay involved and monitor for decline and to discuss further  disposition issues such as rehab versus hospice (should she decline)"   Other Active Problems: Active Problems:   Stroke (cerebrum)  (HCC)   Cerebral infarction ([place])   Respiratory failure ([place])   Goals of care, counseling/discussion   Palliative care by specialist   Paroxysmal atrial fibrillation ([place])   History of stroke   History of seizure   Vascular dementia with behavior disturbance    Hospital day # 3 VTE prophylaxis: SCD's  Diet : Bleeding precautions Fall precautions DIET DYS 3 Room service appropriate? Yes; Fluid consistency: Thin  FAMILY UPDATES: No family at bedside  TEAM UPDATES: Rosalin Hawking, MD      Prior Home Stroke Medications:  clopidogrel 75 mg daily  Discharge Stroke Meds:  Please discharge patient on TBD   Disposition: pending Therapy Recs:  SNF  Follow Up:  Follow-up Information    Ernest Haber. Schedule an appointment as soon as possible for a visit in 6 week(s).   Specialty:  Neurology Contact information: [place] [address] [phone]          Drake Leach, MD -PCP Follow up in 1-2 weeks       Attending Note:  I reviewed above note and agree with the assessment and plan. I have made any additions or clarifications directly to the above note. Pt was seen and examined.   72 year old female who has a very complicated past medical history.  She has a history of CAD status post stent, COPD, smoker, hypertension, kidney cancer status post unilateral nephrectomy.  She also has history of a stroke with right temporal and left subcortical infarcts.  Her loop recorder inserted and found to have A. fib in 09/2016.  Put on Xarelto.  However, patient started to have GI bleeding with anemia requiring for blood transfusion.  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  No more GI bleeding since summer.  However, Xarelto discontinued, currently on Plavix PTA.  She also has a seizure in 04/2013 and was on Lamictal 100 mg twice daily since.  She also has vascular and Alzheimer dementia has been  following with Dr. Ernest Haber in 05/28/17 and 07/28/17 and was put on Aricept.  She had a fall in 08/2017 with fracture of right clavicle and right foot.  Currently on right shoulder sling and right leg splint.  This time patient was admitted for slurred speech, gibberish speech.  CT no bleeding.  But old right MCA and left thalamic infarcts, status post TPA.  CTA head and neck showed left M1 distal chronic occlusion, left M2 collateral flow, right P2 stenosis.  Repeat CT showed acute left cerebellum infarcts.  EF 55-60%.  LDL 101 and A1c 6.5. MRI showed multifocal infarcts involving left cerebellum, left MCA, right punctate MCA, consistent with cardioemoblic infarct due to afib not on AC.  Resume aspirin, pravastatin and other home meds. PT/OT recommend SNF.  Discussed with husband and son and family at bedside regarding long-term anticoagulation.  They are concerning about the GI bleeding in the past, would like to continue Plavix at this time, and will follow-up with cardiology as outpatient to decide further anticoagulation.  She will also follow with neurology Dr. Ernest Haber after discharge. She will also follow up with outpt ortho group for her fractures.   Rosalin Hawking, MD PhD Stroke Neurology 09/04/2017 5:24 PM   To contact Stroke Continuity provider, please refer to [email or web address] After hours, contact General Neurology

## 2017-09-04 NOTE — Clinical Social Work Note (Signed)
Clinical Social Work Assessment  Patient Details  Name: Caroline Blake MRN: [id number] Date of Birth: 1945/09/05  Date of referral:  09/04/17               Reason for consult:  Facility Placement                Permission sought to share information with:  Family Supports Permission granted to share information::     Name::     Transport planner::     Relationship::  Spouse  Contact Information:     Housing/Transportation Living arrangements for the past 2 months:  [place] of Information:  Spouse Patient Interpreter Needed:  None Criminal Activity/Legal Involvement Pertinent to Current Situation/Hospitalization:  No - Comment as needed Significant Relationships:  Adult Children, Spouse Lives with:  Spouse Do you feel safe going back to the place where you live?    Need for family participation in patient care:     Care giving concerns:  Pt is only alert to self and place. Pt lives with spouse. Pt's spouse assist in care when needed.   Social Worker assessment / plan:  CSW spoke with pt's spouse via telephone. Pt's spouse understands the need for SNF and is agreeable. Pt's spouse prefers [facility] however, also agreeable to [employer] [facility] f/o. Pt's spouse would like CSW to come to pts room in the AM. CSW please provide offers.  Employment status:  Retired Nurse, adult PT Recommendations:  [place] / Referral to community resources:  [place]  Patient/Family's Response to care:  *Pt's spouse verbalized understanding of CSW role and expressed appreciation for support. Pt's spouse denies any concern regarding pt care at this time.   Patient/Family's Understanding of and Emotional Response to Diagnosis, Current Treatment, and Prognosis:  Pt's spouse understanding and realistic regarding pt's physical limitations. Pt's spouse understands the need for pt's SNF placement at d/c. Pt agreeable for  pt's SNF placement at d/c, at this time. Pt's spouse denies any concern regarding pt's treatment plan at this time. CSW will continue to provide support and facilitate d/c needs.   Emotional Assessment Appearance:  Appears stated age Attitude/Demeanor/Rapport:  Unable to Assess Affect (typically observed):  Unable to Assess Orientation:  Oriented to Place, Oriented to Self Alcohol / Substance use:  Not Applicable Psych involvement (Current and /or in the community):  No (Comment)  Discharge Needs  Concerns to be addressed:  Basic Needs, Care Coordination Readmission within the last 30 days:  No Current discharge risk:  Dependent with Mobility Barriers to Discharge:  S[employer] broker, Continued Medical Work up   [employer], LCSW 09/04/2017, 5:47 PM

## 2017-09-05 LAB — CBC
HEMATOCRIT: 33.9 % — AB (ref 36.0–46.0)
HEMOGLOBIN: 10.6 g/dL — AB (ref 12.0–15.0)
MCH: 25.6 pg — AB (ref 26.0–34.0)
MCHC: 31.3 g/dL (ref 30.0–36.0)
MCV: 81.9 fL (ref 78.0–100.0)
Platelets: 172 10*3/uL (ref 150–400)
RBC: 4.14 MIL/uL (ref 3.87–5.11)
RDW: 16.9 % — ABNORMAL HIGH (ref 11.5–15.5)
WBC: 8.8 10*3/uL (ref 4.0–10.5)

## 2017-09-05 LAB — BASIC METABOLIC PANEL
ANION GAP: 9 (ref 5–15)
BUN: 49 mg/dL — ABNORMAL HIGH (ref 6–20)
CHLORIDE: 114 mmol/L — AB (ref 101–111)
CO2: 19 mmol/L — AB (ref 22–32)
Calcium: 6.9 mg/dL — ABNORMAL LOW (ref 8.9–10.3)
Creatinine, Ser: 2.03 mg/dL — ABNORMAL HIGH (ref 0.44–1.00)
GFR calc Af Amer: 27 mL/min — ABNORMAL LOW (ref >60)
GFR, EST NON AFRICAN AMERICAN: 24 mL/min — AB (ref >60)
GLUCOSE: 107 mg/dL — AB (ref 65–99)
POTASSIUM: 3.9 mmol/L (ref 3.5–5.1)
Sodium: 142 mmol/L (ref 135–145)

## 2017-09-05 LAB — GLUCOSE, CAPILLARY
Glucose-Capillary: 109 mg/dL — ABNORMAL HIGH (ref 65–99)
Glucose-Capillary: 123 mg/dL — ABNORMAL HIGH (ref 65–99)

## 2017-09-05 MED ORDER — LAMOTRIGINE 100 MG PO TABS: 100.0000 mg | ORAL_TABLET | Freq: Two times a day (BID) | ORAL | 1 refills | Status: DC

## 2017-09-05 MED ORDER — POLYETHYLENE GLYCOL 3350 17 G PO PACK
17.0000 g | PACK | Freq: Two times a day (BID) | ORAL | Status: DC
  Administered 2017-09-05: 17 g via ORAL
  Filled 2017-09-05: qty 1

## 2017-09-05 MED ORDER — LAMOTRIGINE 25 MG PO TABS: 25.0000 mg | ORAL_TABLET | Freq: Two times a day (BID) | ORAL | 1 refills | Status: DC

## 2017-09-05 MED ORDER — LAMOTRIGINE 25 MG PO TABS: 50.0000 mg | ORAL_TABLET | Freq: Two times a day (BID) | ORAL | 1 refills | Status: DC

## 2017-09-05 NOTE — Care Management Important Message (Signed)
Important Message  Patient Details  Name: Caroline Blake MRN: [id number] Date of Birth: Dec 21, 1945   Medicare Important Message Given:  Yes    Orbie Pyo 09/05/2017, 2:27 PM

## 2017-09-05 NOTE — Clinical Social Work Placement (Addendum)
Nurse to call report to [phone]  Transport set up for 3:30 PM    CLINICAL SOCIAL WORK PLACEMENT  NOTE  Date:  09/05/2017  Patient Details  Name: Caroline Blake MRN: [id number] Date of Birth: [birth year]  Clinical Social Work is seeking post-discharge placement for this patient at the [place] level of care (*CSW will initial, date and re-position this form in  chart as items are completed):  Yes   Patient/family provided with [place] Work Department's list of facilities offering this level of care within the geographic area requested by the patient (or if unable, by the patient's family).  Yes   Patient/family informed of their freedom to choose among providers that offer the needed level of care, that participate in Medicare, Medicaid or managed care program needed by the patient, have an available bed and are willing to accept the patient.  Yes   Patient/family informed of [facility]'s ownership interest in [facility] and [facility], as well as of the fact that they are under no obligation to receive care at these facilities.  PASRR submitted to EDS on 09/04/17     PASRR number received on 09/04/17     Existing PASRR number confirmed on       FL2 transmitted to all facilities in geographic area requested by pt/family on 09/04/17     FL2 transmitted to all facilities within larger geographic area on       Patient informed that his/her managed care company has contracts with or will negotiate with certain facilities, including the following:        Yes   Patient/family informed of bed offers received.  Patient chooses bed at [facility]     Physician recommends and patient chooses bed at      Patient to be transferred to [facility] on 09/05/17.  Patient to be transferred to facility by [facility]     Patient family notified on 09/05/17 of transfer.  Name of family member notified:  Shanon Brow      PHYSICIAN       Additional Comment:    _______________________________________________ Geralynn Ochs, LCSW 09/05/2017, 1:56 PM

## 2017-09-05 NOTE — Discharge Summary (Signed)
Stroke Discharge Summary  Patient ID: Caroline Blake   MRN: [id number]      DOB: 09/21/1945  Date of Admission: 09/01/2017 Date of Discharge: 09/05/2017  Attending Physician:  Rosalin Hawking, MD, Stroke MD Consultant(s):   Treatment Team:  Rod Can, MD pulmonary/intensive care, orthopedic surgery and Palliative Patient's PCP:  Drake Leach, MD  DISCHARGE DIAGNOSIS:  multifocal infarcts involving left cerebellum, left MCA, right punctate MCA, consistent with cardioemoblic infarct due to afib not on AC.  Active Problems:   Paroxysmal atrial fibrillation (HCC)   History of stroke   History of seizure   Vascular dementia with behavior disturbance CKD Right Clavicle Fracture Right Foot Fracture Nodule at thoracic outlet  Past Medical History:  Diagnosis Date  . COPD (chronic obstructive pulmonary disease) ([place])   . Stroke [facility])    Allergies as of 09/05/2017      Reactions   Atorvastatin Itching, Other (See Comments)   Myopathy   Carvedilol Itching   Cinoxacin Itching, Other (See Comments)   hypotension   Etodolac Other (See Comments), Itching, Hives   Metoclopramide Itching   Moxifloxacin Itching   Paroxetine Hcl Other (See Comments)   Unknown reaction   Rosuvastatin Calcium Other (See Comments)   Myalgias   Nitrofurantoin Other (See Comments)   Yeast infection   Rivaroxaban Other (See Comments)   GI bleed   Sulfa Antibiotics Hives, Itching   Yellow Dyes (non-tartrazine) Other (See Comments)   unspecified   Aspirin-dipyridamole Er Itching   Pitavastatin Itching      Medication List    STOP taking these medications   amLODipine 10 MG tablet Commonly known as:  NORVASC   HYDROcodone-acetaminophen 5-325 MG tablet Commonly known as:  NORCO/VICODIN   propranolol 40 MG tablet Commonly known as:  INDERAL     TAKE these medications   buPROPion 150 MG 12 hr tablet Commonly known as:  WELLBUTRIN SR Take 150 mg by mouth every morning.   clopidogrel 75  MG tablet Commonly known as:  PLAVIX Take 75 mg by mouth daily.   conjugated estrogens vaginal cream Commonly known as:  PREMARIN Place 1 g vaginally daily as needed for itching.   donepezil 10 MG tablet Commonly known as:  ARICEPT Take 5 mg by mouth daily.   doxycycline 100 MG capsule Commonly known as:  VIBRAMYCIN Take 100 mg by mouth 2 (two) times daily.   ezetimibe 10 MG tablet Commonly known as:  ZETIA Take 10 mg by mouth daily.   FISH OIL PO Take 1 capsule by mouth daily.   insulin aspart protamine- aspart (70-30) 100 UNIT/ML injection Commonly known as:  NOVOLOG MIX 70/30 Inject 3 Units into the skin 2 (two) times daily.   lamoTRIgine 25 MG tablet Commonly known as:  LAMICTAL Take 1 tablet (25 mg total) by mouth 2 (two) times daily. What changed:    medication strength  how much to take   lamoTRIgine 25 MG tablet Commonly known as:  LAMICTAL Take 2 tablets (50 mg total) by mouth 2 (two) times daily. Start taking on:  09/10/2017 What changed:  You were already taking a medication with the same name, and this prescription was added. Make sure you understand how and when to take each.   lamoTRIgine 100 MG tablet Commonly known as:  LAMICTAL Take 1 tablet (100 mg total) by mouth 2 (two) times daily. Start taking on:  09/17/2017 What changed:  You were already taking a medication with the same  name, and this prescription was added. Make sure you understand how and when to take each.   levothyroxine 150 MCG tablet Commonly known as:  SYNTHROID, LEVOTHROID Take 150 mcg by mouth daily.   metoprolol tartrate 50 MG tablet Commonly known as:  LOPRESSOR Take 50 mg by mouth daily.   metroNIDAZOLE 0.75 % vaginal gel Commonly known as:  METROGEL Place 1 Applicatorful vaginally daily as needed for itching.   mirabegron ER 50 MG Tb24 tablet Commonly known as:  MYRBETRIQ Take 25 mg by mouth daily.   phenazopyridine 200 MG tablet Commonly known as:  PYRIDIUM Take 200  mg by mouth daily as needed. UTI   pravastatin 80 MG tablet Commonly known as:  PRAVACHOL Take 80 mg by mouth daily.   sertraline 100 MG tablet Commonly known as:  ZOLOFT Take 200 mg by mouth daily.   traMADol 50 MG tablet Commonly known as:  ULTRAM Take 50 mg by mouth 3 (three) times daily as needed for pain.      LABORATORY STUDIES CBC    Component Value Date/Time   WBC 8.8 09/05/2017 0650   RBC 4.14 09/05/2017 0650   HGB 10.6 (L) 09/05/2017 0650   HCT 33.9 (L) 09/05/2017 0650   PLT 172 09/05/2017 0650   MCV 81.9 09/05/2017 0650   MCH 25.6 (L) 09/05/2017 0650   MCHC 31.3 09/05/2017 0650   RDW 16.9 (H) 09/05/2017 0650   LYMPHSABS 1.4 09/03/2017 0309   MONOABS 0.6 09/03/2017 0309   EOSABS 0.3 09/03/2017 0309   BASOSABS 0.0 09/03/2017 0309   CMP    Component Value Date/Time   NA 142 09/05/2017 0650   K 3.9 09/05/2017 0650   CL 114 (H) 09/05/2017 0650   CO2 19 (L) 09/05/2017 0650   GLUCOSE 107 (H) 09/05/2017 0650   BUN 49 (H) 09/05/2017 0650   CREATININE 2.03 (H) 09/05/2017 0650   CALCIUM 6.9 (L) 09/05/2017 0650   PROT 4.9 (L) 09/01/2017 1051   ALBUMIN 2.5 (L) 09/01/2017 1051   AST 15 09/01/2017 1051   ALT 10 (L) 09/01/2017 1051   ALKPHOS 85 09/01/2017 1051   BILITOT 0.5 09/01/2017 1051   GFRNONAA 24 (L) 09/05/2017 0650   GFRAA 27 (L) 09/05/2017 0650   COAGS Lab Results  Component Value Date   INR 1.29 09/01/2017   Lipid Panel    Component Value Date/Time   CHOL 156 09/02/2017 0212   TRIG 41 09/02/2017 0212   HDL 47 09/02/2017 0212   CHOLHDL 3.3 09/02/2017 0212   VLDL 8 09/02/2017 0212   LDLCALC 101 (H) 09/02/2017 0212   HgbA1C  Lab Results  Component Value Date   HGBA1C 6.5 (H) 09/02/2017   Urinalysis    Component Value Date/Time   COLORURINE YELLOW 09/01/2017 1123   APPEARANCEUR CLEAR 09/01/2017 1123   LABSPEC 1.020 09/01/2017 1123   PHURINE 5.0 09/01/2017 1123   GLUCOSEU NEGATIVE 09/01/2017 1123   HGBUR SMALL (A) 09/01/2017 1123    BILIRUBINUR NEGATIVE 09/01/2017 1123   KETONESUR NEGATIVE 09/01/2017 1123   PROTEINUR NEGATIVE 09/01/2017 1123   NITRITE NEGATIVE 09/01/2017 1123   LEUKOCYTESUR NEGATIVE 09/01/2017 1123   Urine Drug Screen     Component Value Date/Time   LABOPIA NONE DETECTED 09/01/2017 1123   COCAINSCRNUR NONE DETECTED 09/01/2017 1123   LABBENZ NONE DETECTED 09/01/2017 1123   AMPHETMU NONE DETECTED 09/01/2017 1123   THCU NONE DETECTED 09/01/2017 1123   LABBARB NONE DETECTED 09/01/2017 1123    Alcohol Level    Component  Value Date/Time   ETH <10 09/01/2017 1051   SIGNIFICANT DIAGNOSTIC STUDIES CT Head Code Stroke Ct Angio Head and Neck  CT Perfusion W Or Wo Contrast 09/01/2017 IMPRESSION:  1. Distal left M1 short segment occlusion with opacified but under filled left M2 branches. This occlusion is age-indeterminate. No infarct by cerebral perfusion but there is 18cc penumbra/ischemia.  2. No flow limiting stenosis or ulceration in the neck. The patient is wearing an implanted loop recorder.  3. Multifocal remote MCA distribution infarcts.  4. Age-indeterminate left thalamus lacunar infarct.  5. Moderate to advanced right distal P2 segment stenosis.  6. Aortic Atherosclerosis (ICD10-I70.0) and Emphysema (ICD10-J43.9).  7. Unhealed right mid clavicle fracture with surrounding hemorrhage, favored subacute.  8. 2 cm nodule at the thoracic inlet that is high-density or enhancing. Question polar vessel. 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   Ct Head Wo Contrast 09/01/2017 IMPRESSION:  No acute hemorrhage following tPA. No change from head CT earlier today. Chronic ischemic changes bilaterally.   MRI Head Wo Contrast 09/04/2017 IMPRESSION: Acute infarctions in the anterior and posterior circulation suggesting embolic disease from the heart or ascending aorta.  Acute infarctions are most prominently in the left superior  cerebellum and left insula and parietal region with other punctate acute infarctions scattered within the left insula and parietal regions and within the right cerebellum and left posteromedial temporal lobe.  Extensive chronic ischemic changes elsewhere throughout the brain including an old infarction in the right insula and temporoparietal region.  CT Head Wo Contrast 09/02/2017 IMPRESSION: 1. Acute to early subacute infarct of the left cerebellum, within the territory of the superior cerebellar artery. 2. Otherwise unchanged appearance of multiple old infarcts. 3. No acute hemorrhage, herniation or hydrocephalus.                                                      Echocardiogram 09/02/2017       Study Conclusions - Left ventricle: The cavity size was normal. Systolic function was normal. The estimated ejection fraction was in the range of 55% to 60%. Wall motion was normal; there were no regional wall motion abnormalities. Doppler parameters are consistent with abnormal left ventricular relaxation (grade 1 diastolic dysfunction).                                        EXAM: PORTABLE CHEST 1 VIEW IMPRESSION: Mild interstitial prominence stable from the previous exam. No focal infiltrate is noted.    HISTORY OF PRESENT ILLNESS   &   HOSPITAL COURSE  IMPRESSION: 72 year old female who has a very complicated past medical history.  She has a history of CAD status post stent, COPD, smoker, hypertension, kidney cancer status post unilateral nephrectomy.  She also has history of a stroke with right temporal and left subcortical infarcts.  Her loop recorder inserted and found to have A. fib in 09/2016.  Put on Xarelto.  However, patient started to have GI bleeding with anemia requiring for blood transfusion.  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  No more GI bleeding since summer.  However, Xarelto  discontinued, currently on Plavix  PTA.  She also has a seizure in 04/2013 and was on Lamictal 100 mg twice daily since.  She also has vascular and Alzheimer dementia has been following with Dr. Ernest Haber in 05/28/17 and 07/28/17 and was put on Aricept.  She had a fall in 08/2017 with fracture of right clavicle and right foot.  Currently on right shoulder sling and right leg splint.  This time patient was admitted for slurred speech, gibberish speech.  CT no bleeding.  But old right MCA and left thalamic infarcts, status post TPA.  CTA head and neck showed left M1 distal chronic occlusion, left M2 collateral flow, right P2 stenosis.  Repeat CT showed acute left cerebellum infarcts.  EF 55-60%.  LDL 101 and A1c 6.5. MRI showed multifocal infarcts involving left cerebellum, left MCA, right punctate MCA, consistent with cardioemoblic infarct due to afib not on AC.  Resume aspirin, pravastatin and other home meds. PT/OT recommend SNF.    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 Resultant Symptoms: right-sided weakness and aphasia, agitation Stroke Risk Factors: hypertension and smoking Other Stroke Risk Factors: Advanced age, Cigarette smoker, Hx stroke/TIA  Outstanding Stroke Work-up Studies:     MRI Head - Multiple embolic infarcts as noted above. Old infarcts as well.  PLAN  09/04/2017: Continue Statin and Zetia prior to discharge HOLD ASA until 24 hour post tPA neuroimaging is stable & without evidence of bleeding Frequent neuro checks Telemetry monitoring PT/OT/SLP Consult PM & Rehab Consult Case Management /MSW Has Loop, had outpt interrogation Ongoing aggressive stroke risk factor management Patient will be counseled to be compliant withherantithrombotic medications Patient will be counseled on Lifestyle modifications including, Diet, Exercise, and Stress Follow up with Dr [facility] in 4 weeks  HX OF STROKES: Remote MCA distribution infarcts  MEDICAL ISSUES:  CNS Close neuro monitoring  Agitation -Use one-time dose of Ativan 0.5 mg IV -Consider using Seroquel if absolutely needed.   -Avoid using any sedating medications, if possible  RESP -No acute issues for now -Protecting her airway -Consulted pulmonary critical care medicine for help with medical management.  CV HYPERTENSION: Stable SBP goal of <180. DBP goal of <105.  Nicardipine drip, Labetolol PRN Long term BP goal normotensive. May slowly restart home B/P medications after 48 hours Home Meds: Norvasc, Lopressor, Inderal Echocardiogram - EF 55-60%. No cardiac source of emboli identified.  R/O AFIB: Loop Recorder interrogation in progress  HEME Iron Deficiency Anemia - Hb 10.2 ; Hct 32.4 -Monitor -transfuse for hgb < 7 -Trend PT/PTT/INR  GI/GU Cr elevated - BUN 55 ; Creatinine 2.39 Unclear baseline. No records -Gentle hydration -avoid nephrotoxic agents Appreciate PCCM cons  DYSPHAGIA: NPO until passes SLP swallow evaluation Aspiration Precautions in progress  Fluid/Electrolyte Disorders  -Replete Calcium - in progress -Repeat labs, Ionized calcium  - 3.9  Ext Rt clavicle fracture and Right foot fracture. Immobilize with splint for now. -Ortho consulted for clavicle and Right foot fracture -Xrays ordered  2 cm nodule at the thoracic inlet Outpatient ENT/Endocrine surveillance   HYPERLIPIDEMIA: Labs(Brief)     Component Value Date/Time   CHOL 156 09/02/2017 0212   TRIG 41 09/02/2017 0212   HDL 47 09/02/2017 0212   CHOLHDL 3.3 09/02/2017 0212   VLDL 8 09/02/2017 0212   LDLCALC 101 (H) 09/02/2017 0212    Home Meds:  Pravachol 80mg  LDL  goal < 70 Cannot change statin to  Lipitor to 80 mg due to statin allergy Will continue Pravachol 80 mg at discharge and Zetia 10 mg   PRE-DIABETES: RecentLabs       Lab Results   Component Value Date   HGBA1C 6.5 (H) 09/02/2017    HgbA1c goal < 7.0 Currently on: Novolog Continue CBG monitoring and SSI to maintain glucose 140-180 mg/dl DM education   TOBACCO ABUSE  Current smoker Smoking cessation counseling provided Nicotine patch provided  PALLIATIVE CARE CONSULT  DNR / DNI  "Anticipate at this point patient will go to a skilled nursing facility for rehab or CIR             Palliative medicine to stay involved and monitor for decline and to discuss further            disposition issues such as rehab versus hospice (should she decline)"  DISCHARGE EXAM Blood pressure 133/69, pulse (!) 55, temperature 97.9 F (36.6 C), temperature source Axillary, resp. rate 20, height 5\' 4"  (1.626 m), weight 66 kg (145 lb 8.1 oz), SpO2 100 %.   Discharge Diet   Bleeding precautions Fall precautions DIET DYS 3 Room service appropriate? Yes; Fluid consistency: Thin liquids General - sleepy but about to arouse HEENT-  Normocephalic,  Cardiovascular - irregularly irregular heart rate and rhythm.  Respiratory - Lungs clear bilaterally. No wheezing. Abdomen - soft and non-tender, BS normal Skin/muscular skeletal-large bruise over the right shoulder.  Right leg in bandage. Neurological exam Awake alert, orientated to people, month, self and age, but not to place or year. No significant aphasia but with intermittent paraphasic errors. PERRL, blinking to visual threat bilaterally. Eyes moving both directions. No facial asymmetry, tongue in middle. RUE in sling, pain at proximal movement. Distal hand grip 4/5. LUE 4/5 at least. RLE on splint, but BLE 4/5 on movement. RUE slow on FTN but no ataxia. LUE not tested for ataxia due to fracture. Sensation symmetrical. Gait not tested.    DISCHARGE PLAN  Disposition:  SNF  clopidogrel 75 mg daily for secondary stroke prevention.  Ongoing risk factor control by Primary Care Physician at time of discharge  Follow-up  Drake Leach, MD in 2 weeks.  Follow-up with Dr. [facility] in 6 weeks, office to schedule an appointment.  Follow up with Orthopedics  Follow up with Outpatient ENT/Endocrine for nodule surveillance    Greater than 30 minutes were spent preparing discharge.  Mary Sella, ANP-C Neurology Stroke Team 09/05/2017 1:20 PM  ATTENDING NOTE: I reviewed above note and agree with the assessment and plan. I have made any additions or clarifications directly to the above note. Pt was seen and examined.   72 year old female who has a very complicated past medical history.  She has a history of CAD status post stent, COPD, smoker, hypertension, kidney cancer status post unilateral nephrectomy.  She also has history of a stroke with right temporal and left subcortical infarcts.  Her loop recorder inserted and found to have A. fib in 09/2016.  Put on Xarelto.  However, patient started to have GI bleeding with anemia requiring for blood transfusion.  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  No more GI bleeding since summer.  However, Xarelto discontinued, currently on Plavix PTA.  She also has a seizure in 04/2013 and was on Lamictal 100 mg twice daily since.  She also has vascular and Alzheimer dementia has been following with Dr. Mitzi Hansen  Evans in 05/28/17 and 07/28/17 and was put on Aricept.  She had a fall in 08/2017 with fracture of right clavicle and right foot.  Currently on right shoulder sling and right leg splint.  This time patient was admitted for slurred speech, gibberish speech.  CT no bleeding.  But old right MCA and left thalamic infarcts, status post TPA.  CTA head and neck showed left M1 distal chronic occlusion, left M2 collateral flow, right P2 stenosis.  Repeat CT showed acute left cerebellum infarcts.  EF 55-60%.  LDL 101 and A1c 6.5. MRI showed multifocal infarcts involving left cerebellum, left  MCA, right punctate MCA, consistent with cardioemoblic infarct due to afib not on AC.  Resumed plavix, pravastatin and other home meds. Orthopedics consult appreciated, agree with continued should sling and leg splint, continue to follow up outpt orthopedics. PT/OT recommend SNF.  Discussed with husband and son and family at bedside regarding long-term anticoagulation.  They are concerning about the GI bleeding in the past, would like to continue Plavix at this time, and will follow-up with cardiology as outpatient to decide further anticoagulation.  She will also follow with neurology Dr. Ernest Haber 4 weeks after discharge. She will also follow up with outpt ortho group for her fractures. pt discharged in stable condition.   Rosalin Hawking, MD PhD Stroke Neurology 09/05/2017 5:44 PM

## 2017-09-05 NOTE — Plan of Care (Signed)
  Progressing Education: Knowledge of General Education information will improve 09/05/2017 1028 - Progressing by Harlin Heys, [place] Behavior/Discharge Planning: Ability to manage health-related needs will improve 09/05/2017 1028 - Progressing by Harlin Heys, RN Clinical Measurements: Ability to maintain clinical measurements within normal limits will improve 09/05/2017 1028 - Progressing by Harlin Heys, RN Will remain free from infection 09/05/2017 1028 - Progressing by Harlin Heys, RN Diagnostic test results will improve 09/05/2017 1028 - Progressing by Harlin Heys, RN Respiratory complications will improve 09/05/2017 1028 - Progressing by Harlin Heys, RN Cardiovascular complication will be avoided 09/05/2017 1028 - Progressing by Harlin Heys, RN Activity: Risk for activity intolerance will decrease 09/05/2017 1028 - Progressing by Harlin Heys, RN Nutrition: Adequate nutrition will be maintained 09/05/2017 1028 - Progressing by Harlin Heys, RN Coping: Level of anxiety will decrease 09/05/2017 1028 - Progressing by Harlin Heys, RN Elimination: Will not experience complications related to bowel motility 09/05/2017 1028 - Progressing by Harlin Heys, RN Will not experience complications related to urinary retention 09/05/2017 1028 - Progressing by Harlin Heys, RN Pain Managment: General experience of comfort will improve 09/05/2017 1028 - Progressing by Harlin Heys, RN Safety: Ability to remain free from injury will improve 09/05/2017 1028 - Progressing by Harlin Heys, RN Skin Integrity: Risk for impaired skin integrity will decrease 09/05/2017 1028 - Progressing by Harlin Heys, RN Self-Care: Ability to participate in self-care as condition permits will improve 09/05/2017 1028 - Progressing by Debe Coder, Gracee Ratterree, RN Verbalization of feelings and concerns over difficulty with self-care will improve 09/05/2017 1028 - Progressing by  Harlin Heys, RN Ability to communicate needs accurately will improve 09/05/2017 1028 - Progressing by Harlin Heys, RN Education: Knowledge of disease or condition will improve 09/05/2017 1028 - Progressing by Harlin Heys, RN Knowledge of secondary prevention will improve 09/05/2017 1028 - Progressing by Harlin Heys, RN Knowledge of patient specific risk factors addressed and post discharge goals established will improve 09/05/2017 1028 - Progressing by Harlin Heys, RN Coping: Will identify appropriate support needs 09/05/2017 1028 - Progressing by Harlin Heys, [place] Behavior/Discharge Planning: Ability to manage health-related needs will improve 09/05/2017 1028 - Progressing by Harlin Heys, RN Nutrition: Risk of aspiration will decrease 09/05/2017 1028 - Progressing by Harlin Heys, RN Dietary intake will improve 09/05/2017 1028 - Progressing by Harlin Heys, RN Ischemic Stroke/TIA Tissue Perfusion: Complications of ischemic stroke/TIA will be minimized 09/05/2017 1028 - Progressing by Harlin Heys, RN

## 2017-09-05 NOTE — Progress Notes (Signed)
Report called to Caren Griffins, nurse at [facility].

## 2017-09-05 NOTE — Progress Notes (Signed)
  Speech Language Pathology Treatment: Dysphagia;Cognitive-Linquistic  Patient Details Name: Caroline Blake MRN: [id number] DOB: 1945/10/05 Today's Date: 09/05/2017 Time: 1215-1230 SLP Time Calculation (min) (ACUTE ONLY): 15 min  Assessment / Plan / Recommendation Clinical Impression  Pt seen during lunch to assess diet tolerance and provide aphasia treatment. Pt self-feeding dys 3/thin liquid lunch, with no overt s/s aspiration on any consistency. Poor po intake at this meal. Pt receptive language continues to be less impaired than expressive, however, pt indicates she is able to communicate wants and needs to nursing. Pt able to name lunch items, and answer basic questions. Daughter indicates pt is able to indicate when she is hungry/thirsty, or needs to use the bathroom. Continued ST intervention is recommended at next venue for additional aphasia treatment to improve communicative effectiveness and decrease caregiver burden. Daughter is hopeful for DC today.   HPI HPI: Pt is 72 year old woman admitted 09/01/17 with aphasia and R side weakness.+ acute L frontotemporal lobe infarct, s/p tpa. PMH: COPD, current smoker, HTN, CVA, dementia, and multiple fxs. CXR (1/25) revealed mild cardiomegaly, mild bilateral interstitial prominence noted (possibley chronic). Pt failed stroke swallow screen requiring SLP BSE.      SLP Plan  Continue with current plan of care, continue ST intervention at next level of care.       Recommendations  Diet recommendations: Dysphagia 3 (mechanical soft);Thin liquid Liquids provided via: Cup;Straw Medication Administration: Whole meds with liquid Supervision: Patient able to self feed;Intermittent supervision to cue for compensatory strategies Compensations: Minimize environmental distractions;Slow rate;Small sips/bites Postural Changes and/or Swallow Maneuvers: Seated upright 90 degrees                Oral Care Recommendations: Oral care BID Follow up  Recommendations: Skilled Nursing facility SLP Visit Diagnosis: Aphasia (R47.01) Plan: Continue with current plan of care       [name] B. Quentin Ore [facility], CCC-SLP Speech Language Pathologist [phone]  [name] 09/05/2017, 1:19 PM

## 2017-09-05 NOTE — Progress Notes (Signed)
Physical Therapy Treatment Patient Details Name: Caroline Blake MRN: [id number] DOB: May 18, 1946 Today's Date: 09/05/2017    History of Present Illness Pt is 72 year old woman admitted 09/01/17 with aphasia and R side weakness.+ acute L frontotemporal lobe infarct, s/p tpa.  PMH: multiple R foot fxs 1/6--splinted and NWB, R clavicle fx 1/20. PMH: COPD, current smoker, HTN, CVA, dementia.     PT Comments    Patient progressing with therapy. Session focused on bed mobility and sitting balance, as well as transfer into bedside chair. Pt demonstrating improved independence bed mobility and sitting EOB now able to tolerate sitting without UE support during therex. Unable to follow WB precautions during functional OOB mobility, needs continued focus to improve mechanics and cary over. SNF rec still appropriate at this time.    Follow Up Recommendations  SNF;Supervision/Assistance - 24 hour     Equipment Recommendations  None recommended by PT    Recommendations for Other Services       Precautions / Restrictions Precautions Precautions: Fall Restrictions Weight Bearing Restrictions: Yes RUE Weight Bearing: Non weight bearing RLE Weight Bearing: Non weight bearing    Mobility  Bed Mobility Overal bed mobility: Needs Assistance Bed Mobility: Supine to Sit     Supine to sit: +2 for physical assistance;Mod assist     General bed mobility comments: Pt increaesd bed mobility. Able to rise to sitting at EOB with mod A to help power up trunk with guarding from behind. Patient toelrating sitting EOB for 10 minutes  Transfers Overall transfer level: Needs assistance Equipment used: 2 person hand held assist Transfers: Sit to/from [facility] Sit to Stand: Max assist;+2 physical assistance Stand pivot transfers: Total assist;+2 physical assistance       General transfer comment: Patient able to attempt standing with 2 person hand held assist. Unable to follow WB  precautions, and became guarded once standing likely due to confusion/congnitive processing of the dynamic tasks. Total A for pivot transfer into chair.   Ambulation/Gait             General Gait Details: unable   Stairs            Wheelchair Mobility    Modified Rankin (Stroke Patients Only)       Balance Overall balance assessment: History of Falls;Needs assistance   Sitting balance-Leahy Scale: Fair Sitting balance - Comments: tolerating sitting EOB for 10 minutes, at times without UE support.      Standing balance-Leahy Scale: Zero                              Cognition Arousal/Alertness: Awake/alert Behavior During Therapy: Flat affect Overall Cognitive Status: History of cognitive impairments - at baseline Area of Impairment: Orientation;Attention;Memory;Following commands;Safety/judgement;Problem solving;Awareness                   Current Attention Level: Focused Memory: Decreased short-term memory;Decreased recall of precautions Following Commands: Follows one step commands with increased time;Follows one step commands inconsistently Safety/Judgement: Decreased awareness of safety;Decreased awareness of deficits   Problem Solving: Slow processing;Decreased initiation;Difficulty sequencing;Requires verbal cues;Requires tactile cues        Exercises General Exercises - Lower Extremity Long Arc Quad: AROM;Both;10 reps Other Exercises Other Exercises: performed AAROM L UE all areas and R elbow to hand Other Exercises: seated balance exercises with turning and leaning to promote postural control     General Comments General comments (skin integrity,  edema, etc.): Discussed with family SNF rehab considerations.       Pertinent Vitals/Pain Pain Assessment: Faces Faces Pain Scale: Hurts little more Pain Location: R shoulder Pain Descriptors / Indicators: Grimacing;Guarding    Home Living                      Prior  Function            PT Goals (current goals can now be found in the care plan section) Acute Rehab PT Goals Patient Stated Goal: Eventually be able to return home. PT Goal Formulation: With family Time For Goal Achievement: 09/16/17 Potential to Achieve Goals: Fair Progress towards PT goals: Progressing toward goals    Frequency    Min 3X/week      PT Plan Current plan remains appropriate    Co-evaluation              AM-PAC PT "6 Clicks" Daily Activity  Outcome Measure  Difficulty turning over in bed (including adjusting bedclothes, sheets and blankets)?: Unable Difficulty moving from lying on back to sitting on the side of the bed? : Unable Difficulty sitting down on and standing up from a chair with arms (e.g., wheelchair, bedside commode, etc,.)?: Unable Help needed moving to and from a bed to chair (including a wheelchair)?: A Lot Help needed walking in hospital room?: Total Help needed climbing 3-5 steps with a railing? : Total 6 Click Score: 7    End of Session Equipment Utilized During Treatment: Gait belt;Oxygen Activity Tolerance: Patient limited by fatigue Patient left: in chair;with call bell/phone within reach;with chair alarm set Nurse Communication: Mobility status;Need for lift equipment PT Visit Diagnosis: Other abnormalities of gait and mobility (R26.89);Other symptoms and signs involving the nervous system (R29.898)     Time: 1610-9604 PT Time Calculation (min) (ACUTE ONLY): 35 min  Charges:  $Therapeutic Exercise: 8-22 mins $Therapeutic Activity: 8-22 mins                    G Codes:       Reinaldo Berber, PT, DPT Acute Rehab Services Pager: [phone]    Reinaldo Berber 09/05/2017, 10:12 AM

## 2017-09-05 NOTE — Progress Notes (Signed)
Spoke to Dr. Erlinda Hong about ordering something for patient to have a bowel movement, daughter and patient stated they think it was Thursday 1/24 the last time she had a BM. However, Dr. Erlinda Hong said yesterday they told him it was Friday 1/25. Either way, patient has not had a BM even with colace ordered yesterday BID. Dr. Erlinda Hong offered enema or suppository to patient, but patient denied and stated she wanted to try something "less potent". Dr. Erlinda Hong said he would put in for Miralax today.

## 2017-09-06 ENCOUNTER — Encounter: Payer: Self-pay | Admitting: Internal Medicine

## 2017-09-06 ENCOUNTER — Non-Acute Institutional Stay (SKILLED_NURSING_FACILITY): Payer: Medicare Other | Admitting: Internal Medicine

## 2017-09-06 DIAGNOSIS — E890 Postprocedural hypothyroidism: Secondary | ICD-10-CM

## 2017-09-06 DIAGNOSIS — Z87898 Personal history of other specified conditions: Secondary | ICD-10-CM

## 2017-09-06 DIAGNOSIS — Q2733 Arteriovenous malformation of digestive system vessel: Secondary | ICD-10-CM

## 2017-09-06 DIAGNOSIS — I10 Essential (primary) hypertension: Secondary | ICD-10-CM | POA: Diagnosis not present

## 2017-09-06 DIAGNOSIS — I6340 Cerebral infarction due to embolism of unspecified cerebral artery: Secondary | ICD-10-CM

## 2017-09-06 DIAGNOSIS — N183 Chronic kidney disease, stage 3 unspecified: Secondary | ICD-10-CM

## 2017-09-06 DIAGNOSIS — E785 Hyperlipidemia, unspecified: Secondary | ICD-10-CM

## 2017-09-06 DIAGNOSIS — K5521 Angiodysplasia of colon with hemorrhage: Secondary | ICD-10-CM

## 2017-09-06 DIAGNOSIS — Z8673 Personal history of transient ischemic attack (TIA), and cerebral infarction without residual deficits: Secondary | ICD-10-CM | POA: Diagnosis not present

## 2017-09-06 DIAGNOSIS — F0151 Vascular dementia with behavioral disturbance: Secondary | ICD-10-CM

## 2017-09-06 DIAGNOSIS — I480 Paroxysmal atrial fibrillation: Secondary | ICD-10-CM | POA: Diagnosis not present

## 2017-09-06 DIAGNOSIS — E1122 Type 2 diabetes mellitus with diabetic chronic kidney disease: Secondary | ICD-10-CM | POA: Diagnosis not present

## 2017-09-06 DIAGNOSIS — N3281 Overactive bladder: Secondary | ICD-10-CM

## 2017-09-06 DIAGNOSIS — F01518 Vascular dementia, unspecified severity, with other behavioral disturbance: Secondary | ICD-10-CM

## 2017-09-06 DIAGNOSIS — F329 Major depressive disorder, single episode, unspecified: Secondary | ICD-10-CM | POA: Diagnosis not present

## 2017-09-06 DIAGNOSIS — F32A Depression, unspecified: Secondary | ICD-10-CM

## 2017-09-06 NOTE — Progress Notes (Signed)
: Provider:  Noah Delaine. Sheppard Coil, MD Location:  [place] Room Number: [phone] Place of Service:  SNF ([phone])  PCP: Drake Leach, MD Patient Care Team: Drake Leach, MD as PCP - General (Internal Medicine)  Extended Emergency Contact Information Primary Emergency Contact: Etta Quill Address: [place], [place] [address] Johnnette Litter of [place] Phone: [phone] Relation: Spouse Secondary Emergency Contact: Kaelei, [place] Mobile Phone: [phone] Relation: Son     Allergies: Atorvastatin; Carvedilol; Cinoxacin; Etodolac; Metoclopramide; Moxifloxacin; Yellow dye; Paroxetine hcl; Rosuvastatin calcium; Nitrofurantoin; Rivaroxaban; Sulfa antibiotics; Yellow dyes (non-tartrazine); Aspirin-dipyridamole er; Ezetimibe-simvastatin; and Pitavastatin  Chief Complaint  Patient presents with  . New Admit To SNF    Admit to Facility    HPI: Patient is 72 y.o. female with history of coronary artery disease status post stent, COPD, smoker, hypertension, not very compliant with medications or doctor visits, kidney cancer status post unilateral nephrectomy, and history of stroke with right temporal and left subcortical infarcts, and atrial fibrillation, seizures, and Alzheimer's dementia, who presented to [facility] with right sided weakness and dysarthria. Patient normally can perform ADLs on her own and can walk with and without a walker but had fallen several days prior and was seen a [facility] where she was diagnosed with an ankle fracture and a right clavicle fracture. Ankle was too swollen for surgery at that time and surgery was deferred until swelling had resolved. CT head with occlusion left MCA and exam consistent with left MCA syndrome. Patient was admitted to [facility] from 1/24-28 where she received TPA with resultant improvement. Studies were done to workup risk factor for risk factor modifications.  Patient is admitted to skilled nursing facility for OT/PT. While at skilled nursing facility patient will be followed for Alzheimer's treated with Aricept, seizures treated with Lamictal, and hypothyroidism treated with replacement.  Past Medical History:  Diagnosis Date  . Anemia   . Arthritis of ankle or foot, degenerative 04/30/2016  . Atrial fibrillation ([place]) 09/2016   with left atrial appendage, failed xarelto due to GIB, unable to have watchman due to nickel allergy and Coumadin never started  . AVM (arteriovenous malformation) of colon with hemorrhage 2018   requiring transfusion --- last one was March 2018 - HgB on 10/22/2016 was 7.3 and on 10/23/2016 was 8.7  . Bacteriuria with pyuria 08/14/2015  . Benign hypertension 1995  . Cancer of kidney ([place])   . Cholelithiasis 2008  . Chronic conjunctivitis 07/04/2012  . Chronic kidney disease   . Clostridium difficile colitis 2017  . Cognitive impairment   . Colon polyp 2005   and 11/16/2016  . COPD (chronic obstructive pulmonary disease) ([place])    and currenlty smokes--- refuses to use HS home O2  . Coronary artery disease 2002  . CVA (cerebral vascular accident) ([place])   . Cystitis    INTESTINAL CHRONIC  . DDD (degenerative disc disease), cervical   . Depression   . Diabetes mellitus ([place]) 1999   HGBA1C on 09/09/2016 was 6.8  . Disease of thyroid gland   . History of kidney cancer    LEFT  . History of pancreatitis   . History of transfusion 10/2016  . Hyperlipidemia   . Hypertension   . Hypothyroidism   . Iron deficiency anemia 09/27/2016  . Left homonymous hemianopsia 07/04/2012  . Lisfranc dislocation, right, initial encounter 08/14/2017  . Memory impairment 09/09/2016  .  Nuclear sclerosis of left eye 12/09/2016   added automatically from request for surgery [id number]  . OSA (obstructive sleep apnea)    o2 3L [place] PRN  . Pancreatitis 2008  . Posterior vitreous detachment 07/04/2012  . Postsurgical hypothyroidism    S/P  partial thyroidectomy 1978, later went for additional parital thyroidectomy  . Renal cell adenocarcinoma, left ([place]) 2005   [place]  . Retinal drusen 07/04/2012  . Shortness of breath   . Sinus tarsitis of left foot 09/15/2016  . Snores 2005   does not use CPAP regularly  . Stroke [facility]) 2011   LLE sensory, 2012 x2 and 2013 x1; [place]  . TIA (transient ischemic attack) 10/21/2016  . Type 2 diabetes mellitus ([place])   . Type 2 diabetes mellitus with stage 3 chronic kidney disease, with long-term current use of insulin ([place])    on 10/23/2016 Scr was 1.89, GFR 26 and K+ 4.2    Past Surgical History:  Procedure Laterality Date  . CATARACT EXTRACTION    . CATARACT EXTRACTION W/ INTRAOCULAR LENS IMPLANT Left 01/19/2017   Procedure: PHACOEMULSIFICATION PC / IOL TOPICAL; Surgeon: Sonia Side Burden, MD; Location: [place]; Service: Ophthalmology; Laterality: Left;  . CHOLECYSTECTOMY    . COLONOSCOPY Left 09/22/2016   Procedure: COLONOSCOPY, FLEXIBLE, PROXIMAL TO SPLENIC FLEXURE; DIAGNOSTIC, W/WO COLLECTION SPECIMEN BY BRUSH OR [place]; Surgeon: Ned Card, MD; Location: Endo Procedures [facility]; Service: Gastroenterology  . COLONOSCOPY  11/16/2016   Procedure: COLONOSCOPY, FLEXIBLE, PROXIMAL TO SPLENIC FLEXURE; DIAGNOSTIC, W/WO COLLECTION SPECIMEN BY BRUSH OR [place]; Surgeon: Ned Card, MD; Location: Endo Procedures [facility]; Service: Gastroenterology  . COLONOSCOPY W/ BIOPSIES  10/07/2016   Procedure: COLONOSCOPY, FLEXIBLE, PROXIMAL TO SPLENIC FLEXURE; WITH BIOPSY, SINGLE OR MULTIPLE; Surgeon: Bing Quarry, MD; Location: Endo Procedures [facility]; Service: Gastroenterology  . CORONARY STENT PLACEMENT  2002  . GALLBLADDER SURGERY  2008  . LOOP RECORDER IMPLANT  2018   medtronic reveal linq  . NEPHRECTOMY Left 2005   renal cancer   . THYROIDECTOMY, PARTIAL  1978   x2  . TOTAL ABDOMINAL HYSTERECTOMY W/ BILATERAL SALPINGOOPHORECTOMY  1984  . TUBAL LIGATION  1980  . UPPER  GASTROINTESTINAL ENDOSCOPY Left 09/22/2016   Procedure: UGI ENDO, INCLUDE ESOPHAGUS, STOMACH, & DUODENUM &/OR JEJUNUM; DX W/WO COLLECTION SPECIMN, BY BRUSH OR [place]; Surgeon: Ned Card, MD; Location: Endo Procedures [facility]; Service: Gastroenterology    Allergies as of 09/06/2017      Reactions   Atorvastatin Itching, Other (See Comments)   Myopathy   Carvedilol Itching   Cinoxacin Itching, Other (See Comments)   hypotension   Etodolac Other (See Comments), Itching, Hives   Metoclopramide Itching   Moxifloxacin Itching   Yellow Dye Hives   Paroxetine Hcl Other (See Comments)   Unknown reaction   Rosuvastatin Calcium Other (See Comments)   Myalgias   Nitrofurantoin Other (See Comments)   Yeast infection   Rivaroxaban Other (See Comments)   GI bleed   Sulfa Antibiotics Hives, Itching   Yellow Dyes (non-tartrazine) Other (See Comments)   unspecified   Aspirin-dipyridamole Er Itching   Ezetimibe-simvastatin Itching   Pitavastatin Itching      Medication List        Accurate as of 09/06/17 10:40 AM. Always use your most recent med list.          buPROPion 150 MG 12 hr tablet Commonly known as:  WELLBUTRIN SR Take 150 mg by mouth every morning.   clopidogrel 75 MG tablet Commonly known  as:  PLAVIX Take 75 mg by mouth daily.   conjugated estrogens vaginal cream Commonly known as:  PREMARIN Place 1 g vaginally daily as needed for itching.   donepezil 10 MG tablet Commonly known as:  ARICEPT Take 5 mg by mouth daily.   doxycycline 100 MG capsule Commonly known as:  VIBRAMYCIN Take 100 mg by mouth 2 (two) times daily.   ezetimibe 10 MG tablet Commonly known as:  ZETIA Take 10 mg by mouth daily.   FISH OIL PO Take 1 capsule by mouth daily.   insulin aspart protamine- aspart (70-30) 100 UNIT/ML injection Commonly known as:  NOVOLOG MIX 70/30 Inject 3 Units into the skin 2 (two) times daily.   lamoTRIgine 25 MG tablet Commonly known as:  LAMICTAL Take 1  tablet (25 mg total) by mouth 2 (two) times daily.   lamoTRIgine 25 MG tablet Commonly known as:  LAMICTAL Take 2 tablets (50 mg total) by mouth 2 (two) times daily. Start taking on:  09/10/2017   lamoTRIgine 100 MG tablet Commonly known as:  LAMICTAL Take 1 tablet (100 mg total) by mouth 2 (two) times daily. Start taking on:  09/17/2017   levothyroxine 150 MCG tablet Commonly known as:  SYNTHROID, LEVOTHROID Take 150 mcg by mouth daily.   metoprolol tartrate 50 MG tablet Commonly known as:  LOPRESSOR Take 50 mg by mouth daily.   metroNIDAZOLE 0.75 % vaginal gel Commonly known as:  METROGEL Place 1 Applicatorful vaginally daily as needed for itching.   mirabegron ER 50 MG Tb24 tablet Commonly known as:  MYRBETRIQ Take 25 mg by mouth daily.   phenazopyridine 200 MG tablet Commonly known as:  PYRIDIUM Take 200 mg by mouth daily as needed. UTI   pravastatin 80 MG tablet Commonly known as:  PRAVACHOL Take 80 mg by mouth daily.   sertraline 100 MG tablet Commonly known as:  ZOLOFT Take 200 mg by mouth daily.   traMADol 50 MG tablet Commonly known as:  ULTRAM Take 50 mg by mouth 3 (three) times daily as needed for pain.       No orders of the defined types were placed in this encounter.   Immunization History  Administered Date(s) Administered  . Influenza, High Dose Seasonal PF 05/21/2015, 06/14/2017  . Influenza-Unspecified 06/19/2016  . Pneumococcal Conjugate-13 05/21/2015  . Pneumococcal Polysaccharide-23 06/09/2004  . Td 11/08/2009  . Zoster 03/04/2011    Social History   Tobacco Use  . Smoking status: Former Smoker    Packs/day: 1.00    Years: 50.00    Pack years: 50.00    Types: Cigarettes    Last attempt to quit: 10/21/2016    Years since quitting: 0.8  . Smokeless tobacco: Never Used  Substance Use Topics  . Alcohol use: No    Frequency: Never    Family history is   Family History  Problem Relation Age of Onset  . Hyperlipidemia Mother     . Lung cancer Mother   . Aneurysm Mother        abdominal aortic  . Anuerysm Father   . Heart disease Father   . Hyperlipidemia Father   . Stroke Father   . Tremor Father        familial essential  . Hypertension Sister   . Cancer Daughter        Peritoneal carcinomatosis, surviving  . Ovarian cancer Daughter   . Aneurysm Brother        abdominal aortic  . Anesthesia problems  Neg Hx   . Arthritis Neg Hx   . Asthma Neg Hx   . Cerebral palsy Neg Hx   . Clotting disorder Neg Hx   . Deep vein thrombosis Neg Hx   . Club foot Neg Hx   . Collagen disease Neg Hx   . Diabetes Neg Hx   . Gait disorder Neg Hx   . Gout Neg Hx   . Hip dysplasia Neg Hx   . Hip fracture Neg Hx   . Hypermobility Neg Hx   . Osteoporosis Neg Hx   . Pulmonary embolism Neg Hx   . Scoliosis Neg Hx   . Spina bifida Neg Hx   . Thyroid disease Neg Hx   . Vasculitis Neg Hx   . Rheumatologic disease Neg Hx       Review of Systems  DATA OBTAINED: from patient, nurse GENERAL:  no fevers, fatigue, appetite changes SKIN: No itching, or rash EYES: No eye pain, redness, discharge EARS: No earache, tinnitus, change in hearing NOSE: No congestion, drainage or bleeding  MOUTH/THROAT: No mouth or tooth pain, No sore throat RESPIRATORY: No cough, wheezing, SOB CARDIAC: No chest pain, palpitations, lower extremity edema  GI: No abdominal pain, No N/V/D or constipation, No heartburn or reflux  GU: No dysuria, frequency or urgency, or incontinence  MUSCULOSKELETAL: No unrelieved bone/joint pain NEUROLOGIC: No headache, dizziness or focal weakness PSYCHIATRIC: No c/o anxiety or sadness   Vitals:   09/06/17 0908  BP: 135/74  Pulse: (!) 56  Resp: 20  Temp: (!) 97.4 F (36.3 C)  SpO2: 96%    SpO2 Readings from Last 1 Encounters:  09/06/17 96%   Body mass index is 24.89 kg/m.     Physical Exam  GENERAL APPEARANCE: Alert, conversant,  No acute distress.  SKIN: No diaphoresis rash HEAD:  Normocephalic, atraumatic  EYES: Conjunctiva/lids clear. Pupils round, reactive. EOMs intact.  EARS: External exam WNL, canals clear. Hearing grossly normal.  NOSE: No deformity or discharge.  MOUTH/THROAT: Lips w/o lesions  RESPIRATORY: Breathing is even, unlabored. Lung sounds are clear   CARDIOVASCULAR: Heart RRR no murmurs, rubs or gallops. No peripheral edema.   GASTROINTESTINAL: Abdomen is soft, non-tender, not distended w/ normal bowel sounds. GENITOURINARY: Bladder non tender, not distended  MUSCULOSKELETAL: No abnormal joints or musculature NEUROLOGIC:  Cranial nerves 2-12 grossly intact. Moves all extremities  PSYCHIATRIC: Mood and affect appropriate to situation, no behavioral issues  Patient Active Problem List   Diagnosis Date Noted  . Paroxysmal atrial fibrillation (HCC)   . History of stroke   . History of seizure   . Vascular dementia with behavior disturbance   . Cerebral infarction ([place])   . Respiratory failure ([place])   . Goals of care, counseling/discussion   . Palliative care by specialist   . Stroke (cerebrum) ([place]) 09/01/2017      Labs reviewed: Basic Metabolic Panel:    Component Value Date/Time   NA 142 09/05/2017 0650   K 3.9 09/05/2017 0650   CL 114 (H) 09/05/2017 0650   CO2 19 (L) 09/05/2017 0650   GLUCOSE 107 (H) 09/05/2017 0650   BUN 49 (H) 09/05/2017 0650   CREATININE 2.03 (H) 09/05/2017 0650   CALCIUM 6.9 (L) 09/05/2017 0650   PROT 4.9 (L) 09/01/2017 1051   ALBUMIN 2.5 (L) 09/01/2017 1051   AST 15 09/01/2017 1051   ALT 10 (L) 09/01/2017 1051   ALKPHOS 85 09/01/2017 1051   BILITOT 0.5 09/01/2017 1051   GFRNONAA 24 (L) 09/05/2017  0650   GFRAA 27 (L) 09/05/2017 0650    Recent Labs    09/02/17 1000 09/03/17 0309 09/04/17 0235 09/05/17 0650  NA  --  145 143 142  K  --  4.2 4.2 3.9  CL  --  119* 115* 114*  CO2  --  13* 19* 19*  GLUCOSE  --  98 133* 107*  BUN  --  58* 55* 49*  CREATININE  --  2.26* 2.39* 2.03*  CALCIUM  --  6.7*  6.8* 6.9*  MG 1.7 1.7  --   --   PHOS 6.6* 5.7*  --   --    Liver Function Tests: Recent Labs    09/01/17 1051  AST 15  ALT 10*  ALKPHOS 85  BILITOT 0.5  PROT 4.9*  ALBUMIN 2.5*   No results for input(s): LIPASE, AMYLASE in the last 8760 hours. No results for input(s): AMMONIA in the last 8760 hours. CBC: Recent Labs    09/01/17 1051  09/03/17 0309 09/04/17 0235 09/05/17 0650  WBC 10.4  --  11.7* 10.1 8.8  NEUTROABS 8.8*  --  9.5*  --   --   HGB 10.6*   < > 10.8* 10.2* 10.6*  HCT 33.7*   < > 34.4* 32.4* 33.9*  MCV 80.6  --  81.9 82.2 81.9  PLT 211  --  193 161 172   < > = values in this interval not displayed.   Lipid Recent Labs    09/02/17 0212  CHOL 156  HDL 47  LDLCALC 101*  TRIG 41    Cardiac Enzymes: No results for input(s): CKTOTAL, CKMB, CKMBINDEX, TROPONINI in the last 8760 hours. BNP: No results for input(s): BNP in the last 8760 hours. No results found for: [facility] Results  Component Value Date   HGBA1C 6.5 (H) 09/02/2017   No results found for: TSH No results found for: VITAMINB12 No results found for: FOLATE No results found for: IRON, TIBC, FERRITIN  Imaging and Procedures obtained prior to SNF admission: Ct Angio Head W Or Wo Contrast  Result Date: 09/01/2017 CLINICAL DATA:  Code stroke. Weakness and aphasia. Flaccid right side. EXAM: CT ANGIOGRAPHY HEAD AND NECK CT PERFUSION BRAIN TECHNIQUE: Multidetector CT imaging of the head and neck was performed using the standard protocol during bolus administration of intravenous contrast. Multiplanar CT image reconstructions and MIPs were obtained to evaluate the vascular anatomy. Carotid stenosis measurements (when applicable) are obtained utilizing NASCET criteria, using the distal internal carotid diameter as the denominator. Multiphase CT imaging of the brain was performed following IV bolus contrast injection. Subsequent parametric perfusion maps were calculated using RAPID software.  CONTRAST:  Reference EMR COMPARISON:  None. FINDINGS: CT HEAD FINDINGS Brain: There is a large remote right MCA territory infarct affecting temporal and posterior frontal lobes. Remote left frontal operculum infarct. Remote perforator infarct with volume loss in the left basal ganglia and deep white matter tracts. Age-indeterminate lacunar infarct in the left thalamus. Chronic small vessel ischemia. No hemorrhage, hydrocephalus, or masslike finding. Vascular: See below. Skull: Negative Sinuses/Orbits: Negative Other: Negative ASPECTS ([place] Stroke Program Early CT Score) Not scored given the degree of chronic change. CTA NECK FINDINGS Aortic arch: Atherosclerotic calcification.  Three vessel branching. Right carotid system: Moderate bifurcation atheromatous plaque at the common carotid and bifurcation without ulceration or flow limiting stenosis. Negative for beading. Left carotid system: Mild-to-moderate atheromatous plaque at the common carotid bifurcation without flow limiting stenosis or ulceration. Vertebral arteries: No proximal subclavian  flow limiting stenosis. Codominant vertebral arteries. No vertebral stenosis or irregularity. Skeleton: Mid right clavicle fracture that is unhealed/nonunited. Other neck: There is a 2 cm ovoid mass with possible polar artery at the central thoracic inlet. Thyroidectomy. There is thyroid ultrasound in 2015 showing goiter. Upper chest: Centrilobular emphysema. Review of the MIP images confirms the above findings CTA HEAD FINDINGS Anterior circulation: Carotid siphon atherosclerosis without flow limiting stenosis or irregularity. Short segment occlusionof the left distal M1 segment just beyond the anterior temporal branch. Left M2 branches are opacified but appear under filled compared to the right. Atherosclerotic irregularity of right M1 and M2 branches. Negative for aneurysm. Posterior circulation: Vertebral and basilar and widely patent arteries are smooth. Hypoplastic  left P1 segment. Moderate to advanced atheromatous narrowing of the distal right P2 segment. Venous sinuses: Patent as permitted by contrast timing. Anatomic variants: As above Delayed phase: Not obtained in the emergent setting Review of the MIP images confirms the above findings CT Brain Perfusion Findings: Perfusion maps have not yet reached PACS at time of signing, but rapid report was reviewed elsewhere. CBF (<30%) Volume: 58mL Perfusion (Tmax>6.0s) volume: 42mL These results were called by telephone at the time of interpretation on 09/01/2017 at 10:56 am to Dr. Amie Portland , who verbally acknowledged these results. IMPRESSION: 1. Distal left M1 short segment occlusion with opacified but under filled left M2 branches. This occlusion is age-indeterminate. No infarct by cerebral perfusion but there is 18cc penumbra/ischemia. 2. No flow limiting stenosis or ulceration in the neck. The patient is wearing an implanted loop recorder. 3. Multifocal remote MCA distribution infarcts. 4. Age-indeterminate left thalamus lacunar infarct. 5. Moderate to advanced right distal P2 segment stenosis. 6. Aortic Atherosclerosis (ICD10-I70.0) and Emphysema (ICD10-J43.9). 7. Unhealed right mid clavicle fracture with surrounding hemorrhage, favored subacute. 8. 2 cm nodule at the thoracic inlet that is high-density or enhancing. Question polar vessel. 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 Electronically Signed   By: Monte Fantasia M.D.   On: 09/01/2017 11:06   Dg Clavicle Right  Result Date: 09/02/2017 CLINICAL DATA:  Upper chest bruising EXAM: RIGHT CLAVICLE - 2+ VIEWS COMPARISON:  None. FINDINGS: Midshaft right clavicular fracture is noted with downward displacement of the distal fracture fragment. Degenerative changes of the acromioclavicular joint are seen. The humeral head is well seated. The underlying bony thorax is within normal limits. IMPRESSION:  Midshaft right clavicular fracture Electronically Signed   By: Inez Catalina M.D.   On: 09/02/2017 11:15   Dg Ankle Complete Right  Result Date: 09/02/2017 CLINICAL DATA:  Known foot fractures EXAM: RIGHT ANKLE - COMPLETE 3+ VIEW COMPARISON:  08/14/2017 FINDINGS: Casting material limits fine bony detail. The degree of soft tissue swelling has reduced somewhat in the interval from the prior exam. Calcaneal spurring is again noted. Chronic changes about the talonavicular joint are again seen and stable. The known tarsal and metatarsal fractures are not well appreciated on this exam. IMPRESSION: No acute abnormality noted. Known foot fractures are not well appreciated on this study. Electronically Signed   By: Inez Catalina M.D.   On: 09/02/2017 11:16   Ct Head Wo Contrast  Result Date: 09/02/2017 CLINICAL DATA:  Altered mental status EXAM: CT HEAD WITHOUT CONTRAST TECHNIQUE: Contiguous axial images were obtained from the base of the skull through the vertex without intravenous contrast. COMPARISON:  Head CT 09/01/2017 FINDINGS: Brain: There is a new focus of hypoattenuation in the left superior cerebellum consistent  with acute to early subacute infarct. Other sites of chronic infarction are unchanged. There is no acute hemorrhage. No midline shift or other significant mass effect. Vascular: No hyperdense vessel or unexpected calcification. Skull: Normal. Negative for fracture or focal lesion. Sinuses/Orbits: No acute finding. Other: None. IMPRESSION: 1. Acute to early subacute infarct of the left cerebellum, within the territory of the superior cerebellar artery. 2. Otherwise unchanged appearance of multiple old infarcts. 3. No acute hemorrhage, herniation or hydrocephalus. Electronically Signed   By: Ulyses Jarred M.D.   On: 09/02/2017 21:04   Ct Head Wo Contrast  Result Date: 09/01/2017 CLINICAL DATA:  Stroke.  Post tPA EXAM: CT HEAD WITHOUT CONTRAST TECHNIQUE: Contiguous axial images were obtained from  the base of the skull through the vertex without intravenous contrast. COMPARISON:  CT head and CTA head 09/01/2017 FINDINGS: Brain: Negative for acute hemorrhage Chronic infarct right temporoparietal lobe unchanged. Chronic infarct left inferior frontal lobe unchanged. Chronic infarct in the left internal capsule unchanged. Ventricle size normal. No midline shift CT perfusion abnormality in the left parietal lobe without definite CT findings of acute infarct at this time. Vascular: Intravenous contrast is noted from earlier CTA. Skull: Negative Sinuses/Orbits: Negative Other: None IMPRESSION: No acute hemorrhage following tPA. No change from head CT earlier today. Chronic ischemic changes bilaterally. Electronically Signed   By: Franchot Gallo M.D.   On: 09/01/2017 12:24   Ct Angio Neck W Or Wo Contrast  Result Date: 09/01/2017 CLINICAL DATA:  Code stroke. Weakness and aphasia. Flaccid right side. EXAM: CT ANGIOGRAPHY HEAD AND NECK CT PERFUSION BRAIN TECHNIQUE: Multidetector CT imaging of the head and neck was performed using the standard protocol during bolus administration of intravenous contrast. Multiplanar CT image reconstructions and MIPs were obtained to evaluate the vascular anatomy. Carotid stenosis measurements (when applicable) are obtained utilizing NASCET criteria, using the distal internal carotid diameter as the denominator. Multiphase CT imaging of the brain was performed following IV bolus contrast injection. Subsequent parametric perfusion maps were calculated using RAPID software. CONTRAST:  Reference EMR COMPARISON:  None. FINDINGS: CT HEAD FINDINGS Brain: There is a large remote right MCA territory infarct affecting temporal and posterior frontal lobes. Remote left frontal operculum infarct. Remote perforator infarct with volume loss in the left basal ganglia and deep white matter tracts. Age-indeterminate lacunar infarct in the left thalamus. Chronic small vessel ischemia. No hemorrhage,  hydrocephalus, or masslike finding. Vascular: See below. Skull: Negative Sinuses/Orbits: Negative Other: Negative ASPECTS ([place] Stroke Program Early CT Score) Not scored given the degree of chronic change. CTA NECK FINDINGS Aortic arch: Atherosclerotic calcification.  Three vessel branching. Right carotid system: Moderate bifurcation atheromatous plaque at the common carotid and bifurcation without ulceration or flow limiting stenosis. Negative for beading. Left carotid system: Mild-to-moderate atheromatous plaque at the common carotid bifurcation without flow limiting stenosis or ulceration. Vertebral arteries: No proximal subclavian flow limiting stenosis. Codominant vertebral arteries. No vertebral stenosis or irregularity. Skeleton: Mid right clavicle fracture that is unhealed/nonunited. Other neck: There is a 2 cm ovoid mass with possible polar artery at the central thoracic inlet. Thyroidectomy. There is thyroid ultrasound in 2015 showing goiter. Upper chest: Centrilobular emphysema. Review of the MIP images confirms the above findings CTA HEAD FINDINGS Anterior circulation: Carotid siphon atherosclerosis without flow limiting stenosis or irregularity. Short segment occlusionof the left distal M1 segment just beyond the anterior temporal branch. Left M2 branches are opacified but appear under filled compared to the right. Atherosclerotic irregularity of right M1 and M2  branches. Negative for aneurysm. Posterior circulation: Vertebral and basilar and widely patent arteries are smooth. Hypoplastic left P1 segment. Moderate to advanced atheromatous narrowing of the distal right P2 segment. Venous sinuses: Patent as permitted by contrast timing. Anatomic variants: As above Delayed phase: Not obtained in the emergent setting Review of the MIP images confirms the above findings CT Brain Perfusion Findings: Perfusion maps have not yet reached PACS at time of signing, but rapid report was reviewed elsewhere. CBF  (<30%) Volume: 33mL Perfusion (Tmax>6.0s) volume: 26mL These results were called by telephone at the time of interpretation on 09/01/2017 at 10:56 am to Dr. Amie Portland , who verbally acknowledged these results. IMPRESSION: 1. Distal left M1 short segment occlusion with opacified but under filled left M2 branches. This occlusion is age-indeterminate. No infarct by cerebral perfusion but there is 18cc penumbra/ischemia. 2. No flow limiting stenosis or ulceration in the neck. The patient is wearing an implanted loop recorder. 3. Multifocal remote MCA distribution infarcts. 4. Age-indeterminate left thalamus lacunar infarct. 5. Moderate to advanced right distal P2 segment stenosis. 6. Aortic Atherosclerosis (ICD10-I70.0) and Emphysema (ICD10-J43.9). 7. Unhealed right mid clavicle fracture with surrounding hemorrhage, favored subacute. 8. 2 cm nodule at the thoracic inlet that is high-density or enhancing. Question polar vessel. 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 Electronically Signed   By: Monte Fantasia M.D.   On: 09/01/2017 11:06   Ct Cerebral Perfusion W Contrast  Result Date: 09/01/2017 CLINICAL DATA:  Code stroke. Weakness and aphasia. Flaccid right side. EXAM: CT ANGIOGRAPHY HEAD AND NECK CT PERFUSION BRAIN TECHNIQUE: Multidetector CT imaging of the head and neck was performed using the standard protocol during bolus administration of intravenous contrast. Multiplanar CT image reconstructions and MIPs were obtained to evaluate the vascular anatomy. Carotid stenosis measurements (when applicable) are obtained utilizing NASCET criteria, using the distal internal carotid diameter as the denominator. Multiphase CT imaging of the brain was performed following IV bolus contrast injection. Subsequent parametric perfusion maps were calculated using RAPID software. CONTRAST:  Reference EMR COMPARISON:  None. FINDINGS: CT HEAD FINDINGS Brain: There  is a large remote right MCA territory infarct affecting temporal and posterior frontal lobes. Remote left frontal operculum infarct. Remote perforator infarct with volume loss in the left basal ganglia and deep white matter tracts. Age-indeterminate lacunar infarct in the left thalamus. Chronic small vessel ischemia. No hemorrhage, hydrocephalus, or masslike finding. Vascular: See below. Skull: Negative Sinuses/Orbits: Negative Other: Negative ASPECTS (Fowler Stroke Program Early CT Score) Not scored given the degree of chronic change. CTA NECK FINDINGS Aortic arch: Atherosclerotic calcification.  Three vessel branching. Right carotid system: Moderate bifurcation atheromatous plaque at the common carotid and bifurcation without ulceration or flow limiting stenosis. Negative for beading. Left carotid system: Mild-to-moderate atheromatous plaque at the common carotid bifurcation without flow limiting stenosis or ulceration. Vertebral arteries: No proximal subclavian flow limiting stenosis. Codominant vertebral arteries. No vertebral stenosis or irregularity. Skeleton: Mid right clavicle fracture that is unhealed/nonunited. Other neck: There is a 2 cm ovoid mass with possible polar artery at the central thoracic inlet. Thyroidectomy. There is thyroid ultrasound in 2015 showing goiter. Upper chest: Centrilobular emphysema. Review of the MIP images confirms the above findings CTA HEAD FINDINGS Anterior circulation: Carotid siphon atherosclerosis without flow limiting stenosis or irregularity. Short segment occlusionof the left distal M1 segment just beyond the anterior temporal branch. Left M2 branches are opacified but appear under filled compared to the right. Atherosclerotic irregularity of  right M1 and M2 branches. Negative for aneurysm. Posterior circulation: Vertebral and basilar and widely patent arteries are smooth. Hypoplastic left P1 segment. Moderate to advanced atheromatous narrowing of the distal right P2  segment. Venous sinuses: Patent as permitted by contrast timing. Anatomic variants: As above Delayed phase: Not obtained in the emergent setting Review of the MIP images confirms the above findings CT Brain Perfusion Findings: Perfusion maps have not yet reached PACS at time of signing, but rapid report was reviewed elsewhere. CBF (<30%) Volume: 31mL Perfusion (Tmax>6.0s) volume: 27mL These results were called by telephone at the time of interpretation on 09/01/2017 at 10:56 am to Dr. Amie Portland , who verbally acknowledged these results. IMPRESSION: 1. Distal left M1 short segment occlusion with opacified but under filled left M2 branches. This occlusion is age-indeterminate. No infarct by cerebral perfusion but there is 18cc penumbra/ischemia. 2. No flow limiting stenosis or ulceration in the neck. The patient is wearing an implanted loop recorder. 3. Multifocal remote MCA distribution infarcts. 4. Age-indeterminate left thalamus lacunar infarct. 5. Moderate to advanced right distal P2 segment stenosis. 6. Aortic Atherosclerosis (ICD10-I70.0) and Emphysema (ICD10-J43.9). 7. Unhealed right mid clavicle fracture with surrounding hemorrhage, favored subacute. 8. 2 cm nodule at the thoracic inlet that is high-density or enhancing. Question polar vessel. 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 Electronically Signed   By: Monte Fantasia M.D.   On: 09/01/2017 11:06   Dg Chest Port 1 View  Result Date: 09/02/2017 CLINICAL DATA:  COPD.  Hypertension.  History of TIA. EXAM: PORTABLE CHEST 1 VIEW COMPARISON:  Chest x-ray 10/21/2016. FINDINGS: Cardiac monitor device noted. Mild cardiomegaly. No evidence of pulmonary venous congestion. Mild bilateral interstitial prominence. Mild interstitial edema cannot be excluded. A component of the interstitial changes may be chronic. No pleural effusion or pneumothorax. Surgical clips in the neck. IMPRESSION: Mild  cardiomegaly. Mild bilateral interstitial prominence noted. A component these changes may be chronic. Mild interstitial edema cannot be excluded. Electronically Signed   By: Marcello Moores  Register   On: 09/02/2017 11:16   Dg Foot Complete Right  Result Date: 09/02/2017 CLINICAL DATA:  History of prior fracture EXAM: RIGHT FOOT COMPLETE - 3+ VIEW COMPARISON:  08/14/2017 FINDINGS: Casting material is noted in place limiting fine bony detail. The known fractures are not as well appreciated as on recent CT examination. Fracture of the distal aspect of the second metatarsal is well visualized. No new focal abnormality is noted. IMPRESSION: Casting material limits fine bony detail and known tarsal and metatarsal fractures are not as well appreciated on this exam. No new focal abnormality is seen. Electronically Signed   By: Inez Catalina M.D.   On: 09/02/2017 11:13   Ct Head Code Stroke Wo Contrast  Result Date: 09/01/2017 CLINICAL DATA:  Code stroke. Weakness and aphasia. Flaccid right side. EXAM: CT ANGIOGRAPHY HEAD AND NECK CT PERFUSION BRAIN TECHNIQUE: Multidetector CT imaging of the head and neck was performed using the standard protocol during bolus administration of intravenous contrast. Multiplanar CT image reconstructions and MIPs were obtained to evaluate the vascular anatomy. Carotid stenosis measurements (when applicable) are obtained utilizing NASCET criteria, using the distal internal carotid diameter as the denominator. Multiphase CT imaging of the brain was performed following IV bolus contrast injection. Subsequent parametric perfusion maps were calculated using RAPID software. CONTRAST:  Reference EMR COMPARISON:  None. FINDINGS: CT HEAD FINDINGS Brain: There is a large remote right MCA territory infarct affecting temporal and posterior  frontal lobes. Remote left frontal operculum infarct. Remote perforator infarct with volume loss in the left basal ganglia and deep white matter tracts.  Age-indeterminate lacunar infarct in the left thalamus. Chronic small vessel ischemia. No hemorrhage, hydrocephalus, or masslike finding. Vascular: See below. Skull: Negative Sinuses/Orbits: Negative Other: Negative ASPECTS ([place] Stroke Program Early CT Score) Not scored given the degree of chronic change. CTA NECK FINDINGS Aortic arch: Atherosclerotic calcification.  Three vessel branching. Right carotid system: Moderate bifurcation atheromatous plaque at the common carotid and bifurcation without ulceration or flow limiting stenosis. Negative for beading. Left carotid system: Mild-to-moderate atheromatous plaque at the common carotid bifurcation without flow limiting stenosis or ulceration. Vertebral arteries: No proximal subclavian flow limiting stenosis. Codominant vertebral arteries. No vertebral stenosis or irregularity. Skeleton: Mid right clavicle fracture that is unhealed/nonunited. Other neck: There is a 2 cm ovoid mass with possible polar artery at the central thoracic inlet. Thyroidectomy. There is thyroid ultrasound in 2015 showing goiter. Upper chest: Centrilobular emphysema. Review of the MIP images confirms the above findings CTA HEAD FINDINGS Anterior circulation: Carotid siphon atherosclerosis without flow limiting stenosis or irregularity. Short segment occlusionof the left distal M1 segment just beyond the anterior temporal branch. Left M2 branches are opacified but appear under filled compared to the right. Atherosclerotic irregularity of right M1 and M2 branches. Negative for aneurysm. Posterior circulation: Vertebral and basilar and widely patent arteries are smooth. Hypoplastic left P1 segment. Moderate to advanced atheromatous narrowing of the distal right P2 segment. Venous sinuses: Patent as permitted by contrast timing. Anatomic variants: As above Delayed phase: Not obtained in the emergent setting Review of the MIP images confirms the above findings CT Brain Perfusion Findings:  Perfusion maps have not yet reached PACS at time of signing, but rapid report was reviewed elsewhere. CBF (<30%) Volume: 75mL Perfusion (Tmax>6.0s) volume: 52mL These results were called by telephone at the time of interpretation on 09/01/2017 at 10:56 am to Dr. Amie Portland , who verbally acknowledged these results. IMPRESSION: 1. Distal left M1 short segment occlusion with opacified but under filled left M2 branches. This occlusion is age-indeterminate. No infarct by cerebral perfusion but there is 18cc penumbra/ischemia. 2. No flow limiting stenosis or ulceration in the neck. The patient is wearing an implanted loop recorder. 3. Multifocal remote MCA distribution infarcts. 4. Age-indeterminate left thalamus lacunar infarct. 5. Moderate to advanced right distal P2 segment stenosis. 6. Aortic Atherosclerosis (ICD10-I70.0) and Emphysema (ICD10-J43.9). 7. Unhealed right mid clavicle fracture with surrounding hemorrhage, favored subacute. 8. 2 cm nodule at the thoracic inlet that is high-density or enhancing. Question polar vessel. 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 Electronically Signed   By: Monte Fantasia M.D.   On: 09/01/2017 11:06     Not all labs, radiology exams or other studies done during hospitalization come through on my EPIC note; however they are reviewed by me.    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-ejection fraction 55-60%, LDL 101, A1c 6.5; MRI showed multifocal infarcts involving the left cerebellum, left MCA, right punctate MCA consistent with cardioembolic infarct due to atrial fib not on anticoagulant secondary to GI bleed; this is resume aspirin with her some question as to whether patient is allergic to aspirin and not an aspirin is not listed on the discharge summary as of discharge med SNF - admitted for OT/PT ;continue Plavix 75 mg by mouth daily and  other medications for  risk factor modification as listed below  History of GI bleed on xarelto- 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 which was cauterized and has had no bleeding since; however Xarelto was discontinued and patient was on Plavix prior to arrival SNF - continue Plavix 75 mg by mouth daily  History of multiple infarcts on CT with encephalomalacia-speaks of potential for very poor prognosis  Atrial fibrillation SNF - treated with Plavix 75 mg by mouth daily  Hypertension-requiring nicardipine drip and labetalol when necessary in hospital; Norvasc and propranolol DC'd prior to discharge SNF - resume home meds of Lopressor 50 mg by mouth daily  History of seizures SND - patient was started on Lamictal, gradual increasing 200 mg twice a day  Hyperlipidemia-in hospital patient was found to have an LDL of 101; was not able to be placed on Lipitor sedentary to allergist statin therefore Zetia was added to her pravachol SNF - continue Zocor 80 mg by mouth daily and Zetia 10 mg by mouth daily  Hypothyroidism SNF - continue 150 g by mouth daily  Dementia-patient with delirium in hospital, it was suggested the patient not be treated with benzodiazepines-Seroquel would be better SNF - continue Aricept 5 mg by mouth daily  Depression SNF - continue Wellbutrin 150 mg by mouth daily and Zoloft 200 mg by mouth daily  Overactive bladder SNF - continue Myrbetriq ER 25 mg by mouth daily    Time spent greater than 45 minutes;> 50% of time with patient was spent reviewing records, labs, tests and studies, counseling and developing plan of care  Webb Silversmith D. Sheppard Coil, MD

## 2017-09-09 LAB — CBC AND DIFFERENTIAL
HCT: 39 (ref 36–46)
Hemoglobin: 12.5 (ref 12.0–16.0)
Platelets: 229 (ref 150–399)
WBC: 12.2

## 2017-09-09 LAB — BASIC METABOLIC PANEL
BUN: 50 — AB (ref 4–21)
CREATININE: 2 — AB (ref 0.5–1.1)
Glucose: 117
POTASSIUM: 4 (ref 3.4–5.3)
SODIUM: 140 (ref 137–147)

## 2017-09-10 ENCOUNTER — Encounter: Payer: Self-pay | Admitting: Internal Medicine

## 2017-09-10 DIAGNOSIS — E785 Hyperlipidemia, unspecified: Secondary | ICD-10-CM | POA: Insufficient documentation

## 2017-09-10 DIAGNOSIS — N3281 Overactive bladder: Secondary | ICD-10-CM | POA: Insufficient documentation

## 2017-09-10 DIAGNOSIS — F329 Major depressive disorder, single episode, unspecified: Secondary | ICD-10-CM | POA: Insufficient documentation

## 2017-09-10 DIAGNOSIS — E890 Postprocedural hypothyroidism: Secondary | ICD-10-CM | POA: Insufficient documentation

## 2017-09-10 DIAGNOSIS — F32A Depression, unspecified: Secondary | ICD-10-CM | POA: Insufficient documentation

## 2017-09-10 DIAGNOSIS — I119 Hypertensive heart disease without heart failure: Secondary | ICD-10-CM | POA: Insufficient documentation

## 2017-09-14 ENCOUNTER — Emergency Department (HOSPITAL_COMMUNITY): Payer: Medicare Other

## 2017-09-14 ENCOUNTER — Inpatient Hospital Stay (HOSPITAL_COMMUNITY)
Admission: EM | Admit: 2017-09-14 | Discharge: 2017-09-21 | DRG: 683 | Disposition: A | Discharge status: Skilled Nursing Facility | Payer: Medicare Other | Attending: Internal Medicine | Admitting: Internal Medicine

## 2017-09-14 ENCOUNTER — Encounter (HOSPITAL_COMMUNITY): Payer: Self-pay | Admitting: Emergency Medicine

## 2017-09-14 DIAGNOSIS — F01518 Vascular dementia, unspecified severity, with other behavioral disturbance: Secondary | ICD-10-CM | POA: Diagnosis present

## 2017-09-14 DIAGNOSIS — J449 Chronic obstructive pulmonary disease, unspecified: Secondary | ICD-10-CM | POA: Diagnosis present

## 2017-09-14 DIAGNOSIS — N390 Urinary tract infection, site not specified: Secondary | ICD-10-CM | POA: Diagnosis present

## 2017-09-14 DIAGNOSIS — R4701 Aphasia: Secondary | ICD-10-CM

## 2017-09-14 DIAGNOSIS — I639 Cerebral infarction, unspecified: Secondary | ICD-10-CM | POA: Diagnosis present

## 2017-09-14 DIAGNOSIS — E876 Hypokalemia: Secondary | ICD-10-CM | POA: Diagnosis present

## 2017-09-14 DIAGNOSIS — I119 Hypertensive heart disease without heart failure: Secondary | ICD-10-CM | POA: Diagnosis present

## 2017-09-14 DIAGNOSIS — R32 Unspecified urinary incontinence: Secondary | ICD-10-CM | POA: Diagnosis present

## 2017-09-14 DIAGNOSIS — G4733 Obstructive sleep apnea (adult) (pediatric): Secondary | ICD-10-CM | POA: Diagnosis present

## 2017-09-14 DIAGNOSIS — E890 Postprocedural hypothyroidism: Secondary | ICD-10-CM | POA: Diagnosis present

## 2017-09-14 DIAGNOSIS — Z7982 Long term (current) use of aspirin: Secondary | ICD-10-CM

## 2017-09-14 DIAGNOSIS — E872 Acidosis: Secondary | ICD-10-CM | POA: Diagnosis present

## 2017-09-14 DIAGNOSIS — E1151 Type 2 diabetes mellitus with diabetic peripheral angiopathy without gangrene: Secondary | ICD-10-CM | POA: Diagnosis present

## 2017-09-14 DIAGNOSIS — Z905 Acquired absence of kidney: Secondary | ICD-10-CM

## 2017-09-14 DIAGNOSIS — Z6823 Body mass index (BMI) 23.0-23.9, adult: Secondary | ICD-10-CM

## 2017-09-14 DIAGNOSIS — Z8249 Family history of ischemic heart disease and other diseases of the circulatory system: Secondary | ICD-10-CM

## 2017-09-14 DIAGNOSIS — Z90710 Acquired absence of both cervix and uterus: Secondary | ICD-10-CM

## 2017-09-14 DIAGNOSIS — N179 Acute kidney failure, unspecified: Secondary | ICD-10-CM | POA: Diagnosis not present

## 2017-09-14 DIAGNOSIS — E46 Unspecified protein-calorie malnutrition: Secondary | ICD-10-CM | POA: Diagnosis present

## 2017-09-14 DIAGNOSIS — Z90722 Acquired absence of ovaries, bilateral: Secondary | ICD-10-CM

## 2017-09-14 DIAGNOSIS — Z66 Do not resuscitate: Secondary | ICD-10-CM | POA: Diagnosis present

## 2017-09-14 DIAGNOSIS — E785 Hyperlipidemia, unspecified: Secondary | ICD-10-CM | POA: Diagnosis present

## 2017-09-14 DIAGNOSIS — I129 Hypertensive chronic kidney disease with stage 1 through stage 4 chronic kidney disease, or unspecified chronic kidney disease: Secondary | ICD-10-CM | POA: Diagnosis present

## 2017-09-14 DIAGNOSIS — Z87891 Personal history of nicotine dependence: Secondary | ICD-10-CM

## 2017-09-14 DIAGNOSIS — N183 Chronic kidney disease, stage 3 unspecified: Secondary | ICD-10-CM

## 2017-09-14 DIAGNOSIS — D649 Anemia, unspecified: Secondary | ICD-10-CM | POA: Diagnosis present

## 2017-09-14 DIAGNOSIS — E1122 Type 2 diabetes mellitus with diabetic chronic kidney disease: Secondary | ICD-10-CM

## 2017-09-14 DIAGNOSIS — I2510 Atherosclerotic heart disease of native coronary artery without angina pectoris: Secondary | ICD-10-CM | POA: Diagnosis present

## 2017-09-14 DIAGNOSIS — Z955 Presence of coronary angioplasty implant and graft: Secondary | ICD-10-CM

## 2017-09-14 DIAGNOSIS — E875 Hyperkalemia: Secondary | ICD-10-CM | POA: Diagnosis not present

## 2017-09-14 DIAGNOSIS — E870 Hyperosmolality and hypernatremia: Secondary | ICD-10-CM | POA: Diagnosis not present

## 2017-09-14 DIAGNOSIS — Z87898 Personal history of other specified conditions: Secondary | ICD-10-CM

## 2017-09-14 DIAGNOSIS — I480 Paroxysmal atrial fibrillation: Secondary | ICD-10-CM | POA: Diagnosis present

## 2017-09-14 DIAGNOSIS — Z79890 Hormone replacement therapy (postmenopausal): Secondary | ICD-10-CM

## 2017-09-14 DIAGNOSIS — I493 Ventricular premature depolarization: Secondary | ICD-10-CM | POA: Diagnosis not present

## 2017-09-14 DIAGNOSIS — Z794 Long term (current) use of insulin: Secondary | ICD-10-CM

## 2017-09-14 DIAGNOSIS — Z85528 Personal history of other malignant neoplasm of kidney: Secondary | ICD-10-CM

## 2017-09-14 DIAGNOSIS — Z7902 Long term (current) use of antithrombotics/antiplatelets: Secondary | ICD-10-CM

## 2017-09-14 DIAGNOSIS — F0151 Vascular dementia with behavioral disturbance: Secondary | ICD-10-CM | POA: Diagnosis present

## 2017-09-14 DIAGNOSIS — K5521 Angiodysplasia of colon with hemorrhage: Secondary | ICD-10-CM

## 2017-09-14 DIAGNOSIS — I69320 Aphasia following cerebral infarction: Secondary | ICD-10-CM

## 2017-09-14 LAB — I-STAT CHEM 8, ED
BUN: 87 mg/dL — AB (ref 6–20)
Calcium, Ion: 0.69 mmol/L — CL (ref 1.15–1.40)
Chloride: 105 mmol/L (ref 101–111)
Creatinine, Ser: 3.9 mg/dL — ABNORMAL HIGH (ref 0.44–1.00)
Glucose, Bld: 134 mg/dL — ABNORMAL HIGH (ref 65–99)
HEMATOCRIT: 44 % (ref 36.0–46.0)
Hemoglobin: 15 g/dL (ref 12.0–15.0)
POTASSIUM: 4 mmol/L (ref 3.5–5.1)
SODIUM: 139 mmol/L (ref 135–145)
TCO2: 18 mmol/L — AB (ref 22–32)

## 2017-09-14 LAB — COMPREHENSIVE METABOLIC PANEL
ALBUMIN: 2.5 g/dL — AB (ref 3.5–5.0)
ALT: 15 U/L (ref 14–54)
ANION GAP: 19 — AB (ref 5–15)
AST: 26 U/L (ref 15–41)
Alkaline Phosphatase: 178 U/L — ABNORMAL HIGH (ref 38–126)
BUN: 101 mg/dL — ABNORMAL HIGH (ref 6–20)
CHLORIDE: 105 mmol/L (ref 101–111)
CO2: 16 mmol/L — AB (ref 22–32)
Calcium: 5.8 mg/dL — CL (ref 8.9–10.3)
Creatinine, Ser: 3.77 mg/dL — ABNORMAL HIGH (ref 0.44–1.00)
GFR calc Af Amer: 13 mL/min — ABNORMAL LOW (ref >60)
GFR calc non Af Amer: 11 mL/min — ABNORMAL LOW (ref >60)
GLUCOSE: 137 mg/dL — AB (ref 65–99)
POTASSIUM: 4 mmol/L (ref 3.5–5.1)
SODIUM: 140 mmol/L (ref 135–145)
TOTAL PROTEIN: 4.9 g/dL — AB (ref 6.5–8.1)
Total Bilirubin: 0.5 mg/dL (ref 0.3–1.2)

## 2017-09-14 LAB — CBC
HCT: 43.1 % (ref 36.0–46.0)
Hemoglobin: 13.8 g/dL (ref 12.0–15.0)
MCH: 25.6 pg — AB (ref 26.0–34.0)
MCHC: 32 g/dL (ref 30.0–36.0)
MCV: 79.8 fL (ref 78.0–100.0)
PLATELETS: 292 10*3/uL (ref 150–400)
RBC: 5.4 MIL/uL — ABNORMAL HIGH (ref 3.87–5.11)
RDW: 17.3 % — ABNORMAL HIGH (ref 11.5–15.5)
WBC: 15 10*3/uL — ABNORMAL HIGH (ref 4.0–10.5)

## 2017-09-14 LAB — URINALYSIS, ROUTINE W REFLEX MICROSCOPIC
Glucose, UA: NEGATIVE mg/dL
KETONES UR: NEGATIVE mg/dL
Nitrite: NEGATIVE
PH: 8 (ref 5.0–8.0)
PROTEIN: 30 mg/dL — AB
SQUAMOUS EPITHELIAL / LPF: NONE SEEN
Specific Gravity, Urine: 1.01 (ref 1.005–1.030)

## 2017-09-14 LAB — DIFFERENTIAL
BASOS ABS: 0 10*3/uL (ref 0.0–0.1)
BASOS PCT: 0 %
EOS ABS: 0 10*3/uL (ref 0.0–0.7)
EOS PCT: 0 %
Lymphocytes Relative: 9 %
Lymphs Abs: 1.3 10*3/uL (ref 0.7–4.0)
Monocytes Absolute: 0.9 10*3/uL (ref 0.1–1.0)
Monocytes Relative: 6 %
NEUTROS PCT: 85 %
Neutro Abs: 12.8 10*3/uL — ABNORMAL HIGH (ref 1.7–7.7)

## 2017-09-14 LAB — I-STAT TROPONIN, ED: Troponin i, poc: 0 ng/mL (ref 0.00–0.08)

## 2017-09-14 LAB — RAPID URINE DRUG SCREEN, HOSP PERFORMED
Amphetamines: NOT DETECTED
BENZODIAZEPINES: NOT DETECTED
Barbiturates: NOT DETECTED
COCAINE: NOT DETECTED
OPIATES: NOT DETECTED
Tetrahydrocannabinol: NOT DETECTED

## 2017-09-14 LAB — ETHANOL: Alcohol, Ethyl (B): <10 mg/dL (ref <10)

## 2017-09-14 LAB — APTT: APTT: 27 s (ref 24–36)

## 2017-09-14 LAB — PROTIME-INR
INR: 1.25
PROTHROMBIN TIME: 15.6 s — AB (ref 11.4–15.2)

## 2017-09-14 MED ORDER — DEXTROSE 5 % IV SOLN
1.0000 g | Freq: Once | INTRAVENOUS | Status: AC
  Administered 2017-09-14: 1 g via INTRAVENOUS
  Filled 2017-09-14: qty 10

## 2017-09-14 NOTE — ED Provider Notes (Signed)
[place] EMERGENCY DEPARTMENT Provider Note   CSN: [id number] Arrival date & time: 09/14/17  [place]     History   Chief Complaint Chief Complaint  Patient presents with  . Aphasia    HPI HARMONII KARLE is a 72 y.o. female.  Patient is a 72 year old female presenting with worsening aphasia following L-MCA infarct s/p tPA on 09/01/17 with residual aphasia.  PMH significant for A. fib on Plavix, COPD, tobacco use disorder, HTN, AVM with history of GI bleed, T2DM, OSA.  Patient has had multiple hospitalizations over the last 30 days for various problems including traumatic right foot fracture followed by right shoulder fracture following mechanical fall and subsequent ischemic left MCA infarct on 09/01/17.  Patient initially received TPA during this encounter without improvement of symptoms but continued to have aphasia.  She was also in the ICU for aspiration pneumonia however she recovered and was discharged to [place].  Patient has since followed up with neurology taking Plavix.  Father and son who are provided the majority of this history state patient experiencing worsening aphasia and generalized weakness while using the bathroom around 5 PM.  Patient was brought to ED via EMS.      Past Medical History:  Diagnosis Date  . Anemia   . Arthritis of ankle or foot, degenerative 04/30/2016  . Atrial fibrillation ([place]) 09/2016   with left atrial appendage, failed xarelto due to GIB, unable to have watchman due to nickel allergy and Coumadin never started  . AVM (arteriovenous malformation) of colon with hemorrhage 2018   requiring transfusion --- last one was March 2018 - HgB on 10/22/2016 was 7.3 and on 10/23/2016 was 8.7  . Bacteriuria with pyuria 08/14/2015  . Benign hypertension 1995  . Cancer of kidney ([place])   . Cholelithiasis 2008  . Chronic conjunctivitis 07/04/2012  . Chronic kidney disease   . Clostridium difficile colitis 2017  . Cognitive impairment     . Colon polyp 2005   and 11/16/2016  . COPD (chronic obstructive pulmonary disease) ([place])    and currenlty smokes--- refuses to use HS home O2  . Coronary artery disease 2002  . CVA (cerebral vascular accident) ([place])   . Cystitis    INTESTINAL CHRONIC  . DDD (degenerative disc disease), cervical   . Depression   . Diabetes mellitus ([place]) 1999   HGBA1C on 09/09/2016 was 6.8  . Disease of thyroid gland   . History of kidney cancer    LEFT  . History of pancreatitis   . History of transfusion 10/2016  . Hyperlipidemia   . Hypertension   . Hypothyroidism   . Iron deficiency anemia 09/27/2016  . Left homonymous hemianopsia 07/04/2012  . Lisfranc dislocation, right, initial encounter 08/14/2017  . Memory impairment 09/09/2016  . Nuclear sclerosis of left eye 12/09/2016   added automatically from request for surgery [phone]  . OSA (obstructive sleep apnea)    o2 3L [place] PRN  . Pancreatitis 2008  . Posterior vitreous detachment 07/04/2012  . Postsurgical hypothyroidism    S/P partial thyroidectomy 1978, later went for additional parital thyroidectomy  . Renal cell adenocarcinoma, left ([place]) 2005   [place]  . Retinal drusen 07/04/2012  . Shortness of breath   . Sinus tarsitis of left foot 09/15/2016  . Snores 2005   does not use CPAP regularly  . Stroke [facility]) 2011   LLE sensory, 2012 x2 and 2013 x1; [place]  . TIA (transient ischemic attack) 10/21/2016  .  Type 2 diabetes mellitus ([place])   . Type 2 diabetes mellitus with stage 3 chronic kidney disease, with long-term current use of insulin ([place])    on 10/23/2016 Scr was 1.89, GFR 26 and K+ 4.2    Patient Active Problem List   Diagnosis Date Noted  . Hypertension 09/10/2017  . Hyperlipidemia 09/10/2017  . Depression 09/10/2017  . 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 Overactive bladder 09/10/2017  . Paroxysmal atrial fibrillation (HCC)   . History of stroke   . History of seizure   . Vascular dementia with behavior disturbance    . Cerebral infarction ([place])   . Respiratory failure ([place])   . Goals of care, counseling/discussion   . Palliative care by specialist   . Stroke (cerebrum) ([place]) 09/01/2017  . AVM (arteriovenous malformation) of colon with hemorrhage 08/09/2016  . Diabetes mellitus ([place]) 08/09/1997    Past Surgical History:  Procedure Laterality Date  . CATARACT EXTRACTION    . CATARACT EXTRACTION W/ INTRAOCULAR LENS IMPLANT Left 01/19/2017   Procedure: PHACOEMULSIFICATION PC / IOL TOPICAL; Surgeon: Sonia Side Burden, MD; Location: [place]; Service: Ophthalmology; Laterality: Left;  . CHOLECYSTECTOMY    . COLONOSCOPY Left 09/22/2016   Procedure: COLONOSCOPY, FLEXIBLE, PROXIMAL TO SPLENIC FLEXURE; DIAGNOSTIC, W/WO COLLECTION SPECIMEN BY BRUSH OR [place]; Surgeon: Ned Card, MD; Location: Endo Procedures [facility]; Service: Gastroenterology  . COLONOSCOPY  11/16/2016   Procedure: COLONOSCOPY, FLEXIBLE, PROXIMAL TO SPLENIC FLEXURE; DIAGNOSTIC, W/WO COLLECTION SPECIMEN BY BRUSH OR [place]; Surgeon: Ned Card, MD; Location: Endo Procedures [facility]; Service: Gastroenterology  . COLONOSCOPY W/ BIOPSIES  10/07/2016   Procedure: COLONOSCOPY, FLEXIBLE, PROXIMAL TO SPLENIC FLEXURE; WITH BIOPSY, SINGLE OR MULTIPLE; Surgeon: Bing Quarry, MD; Location: Endo Procedures [facility]; Service: Gastroenterology  . CORONARY STENT PLACEMENT  2002  . GALLBLADDER SURGERY  2008  . LOOP RECORDER IMPLANT  2018   medtronic reveal linq  . NEPHRECTOMY Left 2005   renal cancer   . THYROIDECTOMY, PARTIAL  1978   x2  . TOTAL ABDOMINAL HYSTERECTOMY W/ BILATERAL SALPINGOOPHORECTOMY  1984  . TUBAL LIGATION  1980  . UPPER GASTROINTESTINAL ENDOSCOPY Left 09/22/2016   Procedure: UGI ENDO, INCLUDE ESOPHAGUS, STOMACH, & DUODENUM &/OR JEJUNUM; DX W/WO COLLECTION SPECIMN, BY BRUSH OR [place]; Surgeon: Ned Card, MD; Location: Endo Procedures [place]; Service: Gastroenterology    OB History    Gravida Para Term Preterm  AB Living   2 2 2          SAB TAB Ectopic Multiple Live Births                   Home Medications    Prior to Admission medications   Medication Sig Start Date End Date Taking? Authorizing Provider  buPROPion (WELLBUTRIN SR) 150 MG 12 hr tablet Take 150 mg by mouth every morning. 05/28/17   [provider]  clopidogrel (PLAVIX) 75 MG tablet Take 75 mg by mouth daily. 05/27/17   [provider]  conjugated estrogens (PREMARIN) vaginal cream Place 1 g vaginally daily as needed for itching. 09/06/14   [provider]  donepezil (ARICEPT) 10 MG tablet Take 5 mg by mouth daily. 05/27/17   [provider]  doxycycline (VIBRAMYCIN) 100 MG capsule Take 100 mg by mouth 2 (two) times daily. 08/24/17   [provider]  ezetimibe (ZETIA) 10 MG tablet Take 10 mg by mouth daily. 04/02/13   [provider]  insulin aspart protamine- aspart (NOVOLOG MIX 70/30) (70-30) 100 UNIT/ML injection Inject 3  Units into the skin 2 (two) times daily.    [provider]  lamoTRIgine (LAMICTAL) 100 MG tablet Take 1 tablet (100 mg total) by mouth 2 (two) times daily. 09/17/17   Mary Sella, NP  lamoTRIgine (LAMICTAL) 25 MG tablet Take 1 tablet (25 mg total) by mouth 2 (two) times daily. 09/05/17   Mary Sella, NP  lamoTRIgine (LAMICTAL) 25 MG tablet Take 2 tablets (50 mg total) by mouth 2 (two) times daily. 09/10/17   Mary Sella, NP  levothyroxine (SYNTHROID, LEVOTHROID) 150 MCG tablet Take 150 mcg by mouth daily. 08/12/17   [provider]  metoprolol tartrate (LOPRESSOR) 50 MG tablet Take 50 mg by mouth daily. 03/12/13   [provider]  metroNIDAZOLE (METROGEL) 0.75 % vaginal gel Place 1 Applicatorful vaginally daily as needed for itching. 07/29/17   [provider]  mirabegron ER (MYRBETRIQ) 50 MG TB24 tablet Take 25 mg by mouth daily.    [provider]  Omega-3 Fatty Acids (FISH OIL PO) Take 1 capsule by mouth  daily.    [provider]  phenazopyridine (PYRIDIUM) 200 MG tablet Take 200 mg by mouth daily as needed. UTI 08/24/17   [provider]  pravastatin (PRAVACHOL) 80 MG tablet Take 80 mg by mouth daily. 05/27/17   [provider]  sertraline (ZOLOFT) 100 MG tablet Take 200 mg by mouth daily. 08/09/17   [provider]  traMADol (ULTRAM) 50 MG tablet Take 50 mg by mouth 3 (three) times daily as needed for pain. 02/25/17   [provider]    Family History Family History  Problem Relation Age of Onset  . Hyperlipidemia Mother   . Lung cancer Mother   . Aneurysm Mother        abdominal aortic  . Anuerysm Father   . Heart disease Father   . Hyperlipidemia Father   . Stroke Father   . Tremor Father        familial essential  . Hypertension Sister   . Cancer Daughter        Peritoneal carcinomatosis, surviving  . Ovarian cancer Daughter   . Aneurysm Brother        abdominal aortic  . Anesthesia problems Neg Hx   . Arthritis Neg Hx   . Asthma Neg Hx   . Cerebral palsy Neg Hx   . Clotting disorder Neg Hx   . Deep vein thrombosis Neg Hx   . Club foot Neg Hx   . Collagen disease Neg Hx   . Diabetes Neg Hx   . Gait disorder Neg Hx   . Gout Neg Hx   . Hip dysplasia Neg Hx   . Hip fracture Neg Hx   . Hypermobility Neg Hx   . Osteoporosis Neg Hx   . Pulmonary embolism Neg Hx   . Scoliosis Neg Hx   . Spina bifida Neg Hx   . Thyroid disease Neg Hx   . Vasculitis Neg Hx   . Rheumatologic disease Neg Hx     Social History Social History   Tobacco Use  . Smoking status: Former Smoker    Packs/day: 1.00    Years: 50.00    Pack years: 50.00    Types: Cigarettes    Last attempt to quit: 10/21/2016    Years since quitting: 0.8  . Smokeless tobacco: Never Used  Substance Use Topics  . Alcohol use: No    Frequency: Never  . Drug use: Not on file  Allergies   Atorvastatin; Carvedilol; Cinoxacin; Etodolac; Metoclopramide;  Moxifloxacin; Yellow dye; Paroxetine hcl; Rosuvastatin calcium; Nitrofurantoin; Rivaroxaban; Sulfa antibiotics; Yellow dyes (non-tartrazine); Aspirin-dipyridamole er; Ezetimibe-simvastatin; and Pitavastatin   Review of Systems Review of Systems   Physical Exam Updated Vital Signs BP 101/65   Pulse 81   Temp 98.2 F (36.8 C)   Resp 13   SpO2 95%   Physical Exam   ED Treatments / Results  Labs (all labs ordered are listed, but only abnormal results are displayed) Labs Reviewed  PROTIME-INR - Abnormal; Notable for the following components:      Result Value   Prothrombin Time 15.6 (*)    All other components within normal limits  CBC - Abnormal; Notable for the following components:   WBC 15.0 (*)    RBC 5.40 (*)    MCH 25.6 (*)    RDW 17.3 (*)    All other components within normal limits  DIFFERENTIAL - Abnormal; Notable for the following components:   Neutro Abs 12.8 (*)    All other components within normal limits  COMPREHENSIVE METABOLIC PANEL - Abnormal; Notable for the following components:   CO2 16 (*)    Glucose, Bld 137 (*)    BUN 101 (*)    Creatinine, Ser 3.77 (*)    Calcium 5.8 (*)    Total Protein 4.9 (*)    Albumin 2.5 (*)    Alkaline Phosphatase 178 (*)    GFR calc non Af Amer 11 (*)    GFR calc Af Amer 13 (*)    Anion gap 19 (*)    All other components within normal limits  I-STAT CHEM 8, ED - Abnormal; Notable for the following components:   BUN 87 (*)    Creatinine, Ser 3.90 (*)    Glucose, Bld 134 (*)    Calcium, Ion 0.69 (*)    TCO2 18 (*)    All other components within normal limits  ETHANOL  APTT  RAPID URINE DRUG SCREEN, HOSP PERFORMED  URINALYSIS, ROUTINE W REFLEX MICROSCOPIC  I-STAT TROPONIN, ED    EKG  EKG Interpretation None       Radiology Dg Chest 2 View  Result Date: 09/14/2017 CLINICAL DATA:  Increased slurred speech and altered mental status. EXAM: CHEST  2 VIEW COMPARISON:  09/03/2017. FINDINGS: Normal heart size.  Loop recorder. Clear lung fields. Thoracic atherosclerosis. No osseous findings. IMPRESSION: No active cardiopulmonary disease.  Stable exam. Electronically Signed   By: Staci Righter M.D.   On: 09/14/2017 20:22   Ct Head Wo Contrast  Result Date: 09/14/2017 CLINICAL DATA:  Increased slurred speech and altered mental status EXAM: CT HEAD WITHOUT CONTRAST TECHNIQUE: Contiguous axial images were obtained from the base of the skull through the vertex without intravenous contrast. COMPARISON:  09/04/2017, 09/02/2017, 09/01/2017, 10/22/2016 FINDINGS: Brain: Multifocal old infarcts involving the right temporal lobe, right parietal lobe, inferior left frontal lobe, and left sub insula and periventricular white matter. Hypodensity within the left posterior insula and medial posterior temporal lobe, corresponding to the infarct demonstrated on MRI 09/04/2016. Left cerebellar infarct again demonstrated. No hemorrhage. No mass. No midline shift. Moderate small vessel ischemic changes of the white matter. Moderate atrophy. Vascular: No hyperdense vessels.  Carotid artery calcification. Skull: No fracture.  Hyperostosis. Sinuses/Orbits: Mild mucosal thickening in the ethmoid sinuses. No acute orbital abnormality. Left lens extraction. Other: None IMPRESSION: 1. Left cerebellar, left posterior insula, and left posteromedial temporal lobe infarcts as noted on recent MRI 09/04/2017; negative  for hemorrhage or significant mass effect. 2. Multifocal old bilateral infarcts, atrophy, and small vessel ischemic changes of the white matter. Electronically Signed   By: Donavan Foil M.D.   On: 09/14/2017 20:17    Procedures Procedures (including critical care time)  Medications Ordered in ED Medications - No data to display   Initial Impression / Assessment and Plan / ED Course  I have reviewed the triage vital signs and the nursing notes.  Pertinent labs & imaging results that were available during my care of the patient  were reviewed by me and considered in my medical decision making (see chart for details).    Patient is a 72 year old female presenting with worsening aphasia following L-MCA infarct s/p tPA on 09/01/17 with residual aphasia.  PMH significant for A. fib on Plavix, COPD, tobacco use disorder, HTN, AVM with history of GI bleed, T2DM, OSA.  Upon arrival, vitals stable on presentation.  Patient with mild expressive aphasia.  Patient alert and oriented to person and place but not to year.  Patient with 5/5 motor strength on all 4 extremities though difficult to assess on right lower extremity due to cast.  Patient was not candidate for TPA.  EKG NSR with prolonged QTC 523. CXR negative. CT head left cerebellar, insular, post medial temporal lobe infarcts as compared to prior MRI on 09/04/2017 without hemorrhage or mass-effect.  Pertinent labs include leukocytosis of 15 with ANC 12.8, Cr 3.9 (BL 2), INR 1.25, i-STAT troponin 0.  Neurology consulted recommending repeat MRI without contrast to determine if there is acute infarct versus residual symptoms from evolving prior stroke.  If there are no new findings, patient can follow-up with neurology outpatient.  Patients with clinical signs of weakness and confusion and AKI.  Will admit and consult neurology if MRI shows new findings.  Final Clinical Impressions(s) / ED Diagnoses   Final diagnoses:  AKI (acute kidney injury) [facility])  Aphasia    ED Discharge Orders    None       Allenton Bing, DO 09/14/17 2116    Isla Pence, MD 09/14/17 2142    Isla Pence, MD 09/14/17 2145    Isla Pence, MD 09/28/17 [phone]

## 2017-09-14 NOTE — ED Triage Notes (Signed)
Pt arrives via EMS from [facility] with complaints of increased slurred speech and AMS. Per family,she is slurred at baseline from CVA last month.

## 2017-09-14 NOTE — ED Notes (Signed)
Patient transported to CT 

## 2017-09-15 ENCOUNTER — Observation Stay (HOSPITAL_COMMUNITY): Payer: Medicare Other

## 2017-09-15 ENCOUNTER — Other Ambulatory Visit: Payer: Self-pay

## 2017-09-15 ENCOUNTER — Encounter (HOSPITAL_COMMUNITY): Payer: Self-pay | Admitting: *Deleted

## 2017-09-15 DIAGNOSIS — N390 Urinary tract infection, site not specified: Secondary | ICD-10-CM | POA: Diagnosis present

## 2017-09-15 DIAGNOSIS — Z79890 Hormone replacement therapy (postmenopausal): Secondary | ICD-10-CM | POA: Diagnosis not present

## 2017-09-15 DIAGNOSIS — R4701 Aphasia: Secondary | ICD-10-CM | POA: Diagnosis not present

## 2017-09-15 DIAGNOSIS — E872 Acidosis: Secondary | ICD-10-CM | POA: Diagnosis present

## 2017-09-15 DIAGNOSIS — E46 Unspecified protein-calorie malnutrition: Secondary | ICD-10-CM | POA: Diagnosis present

## 2017-09-15 DIAGNOSIS — Z794 Long term (current) use of insulin: Secondary | ICD-10-CM | POA: Diagnosis not present

## 2017-09-15 DIAGNOSIS — I480 Paroxysmal atrial fibrillation: Secondary | ICD-10-CM | POA: Diagnosis not present

## 2017-09-15 DIAGNOSIS — Z7902 Long term (current) use of antithrombotics/antiplatelets: Secondary | ICD-10-CM | POA: Diagnosis not present

## 2017-09-15 DIAGNOSIS — J449 Chronic obstructive pulmonary disease, unspecified: Secondary | ICD-10-CM | POA: Diagnosis present

## 2017-09-15 DIAGNOSIS — E785 Hyperlipidemia, unspecified: Secondary | ICD-10-CM | POA: Diagnosis present

## 2017-09-15 DIAGNOSIS — I10 Essential (primary) hypertension: Secondary | ICD-10-CM | POA: Diagnosis not present

## 2017-09-15 DIAGNOSIS — E1151 Type 2 diabetes mellitus with diabetic peripheral angiopathy without gangrene: Secondary | ICD-10-CM | POA: Diagnosis present

## 2017-09-15 DIAGNOSIS — E1122 Type 2 diabetes mellitus with diabetic chronic kidney disease: Secondary | ICD-10-CM | POA: Diagnosis present

## 2017-09-15 DIAGNOSIS — I129 Hypertensive chronic kidney disease with stage 1 through stage 4 chronic kidney disease, or unspecified chronic kidney disease: Secondary | ICD-10-CM | POA: Diagnosis present

## 2017-09-15 DIAGNOSIS — G4733 Obstructive sleep apnea (adult) (pediatric): Secondary | ICD-10-CM | POA: Diagnosis present

## 2017-09-15 DIAGNOSIS — E890 Postprocedural hypothyroidism: Secondary | ICD-10-CM | POA: Diagnosis present

## 2017-09-15 DIAGNOSIS — F0151 Vascular dementia with behavioral disturbance: Secondary | ICD-10-CM | POA: Diagnosis present

## 2017-09-15 DIAGNOSIS — Q2733 Arteriovenous malformation of digestive system vessel: Secondary | ICD-10-CM | POA: Diagnosis not present

## 2017-09-15 DIAGNOSIS — Z8249 Family history of ischemic heart disease and other diseases of the circulatory system: Secondary | ICD-10-CM | POA: Diagnosis not present

## 2017-09-15 DIAGNOSIS — Z87891 Personal history of nicotine dependence: Secondary | ICD-10-CM | POA: Diagnosis not present

## 2017-09-15 DIAGNOSIS — E870 Hyperosmolality and hypernatremia: Secondary | ICD-10-CM | POA: Diagnosis not present

## 2017-09-15 DIAGNOSIS — Z66 Do not resuscitate: Secondary | ICD-10-CM | POA: Diagnosis present

## 2017-09-15 DIAGNOSIS — I2510 Atherosclerotic heart disease of native coronary artery without angina pectoris: Secondary | ICD-10-CM | POA: Diagnosis present

## 2017-09-15 DIAGNOSIS — N179 Acute kidney failure, unspecified: Secondary | ICD-10-CM | POA: Diagnosis present

## 2017-09-15 DIAGNOSIS — Z515 Encounter for palliative care: Secondary | ICD-10-CM | POA: Diagnosis not present

## 2017-09-15 DIAGNOSIS — N183 Chronic kidney disease, stage 3 (moderate): Secondary | ICD-10-CM | POA: Diagnosis present

## 2017-09-15 DIAGNOSIS — Z85528 Personal history of other malignant neoplasm of kidney: Secondary | ICD-10-CM | POA: Diagnosis not present

## 2017-09-15 LAB — COMPREHENSIVE METABOLIC PANEL
ALK PHOS: 173 U/L — AB (ref 38–126)
ALT: 18 U/L (ref 14–54)
AST: 24 U/L (ref 15–41)
Albumin: 2.3 g/dL — ABNORMAL LOW (ref 3.5–5.0)
Anion gap: 23 — ABNORMAL HIGH (ref 5–15)
BILIRUBIN TOTAL: 0.3 mg/dL (ref 0.3–1.2)
BUN: 121 mg/dL — ABNORMAL HIGH (ref 6–20)
CO2: 11 mmol/L — ABNORMAL LOW (ref 22–32)
CREATININE: 4.55 mg/dL — AB (ref 0.44–1.00)
Calcium: 5.6 mg/dL — CL (ref 8.9–10.3)
Chloride: 107 mmol/L (ref 101–111)
GFR, EST AFRICAN AMERICAN: 10 mL/min — AB (ref >60)
GFR, EST NON AFRICAN AMERICAN: 9 mL/min — AB (ref >60)
Glucose, Bld: 124 mg/dL — ABNORMAL HIGH (ref 65–99)
Potassium: 3.4 mmol/L — ABNORMAL LOW (ref 3.5–5.1)
Sodium: 141 mmol/L (ref 135–145)
TOTAL PROTEIN: 4.7 g/dL — AB (ref 6.5–8.1)

## 2017-09-15 LAB — CBC
HCT: 42.4 % (ref 36.0–46.0)
Hemoglobin: 13.8 g/dL (ref 12.0–15.0)
MCH: 25.8 pg — ABNORMAL LOW (ref 26.0–34.0)
MCHC: 32.5 g/dL (ref 30.0–36.0)
MCV: 79.4 fL (ref 78.0–100.0)
PLATELETS: 294 10*3/uL (ref 150–400)
RBC: 5.34 MIL/uL — AB (ref 3.87–5.11)
RDW: 17.8 % — AB (ref 11.5–15.5)
WBC: 14.3 10*3/uL — AB (ref 4.0–10.5)

## 2017-09-15 LAB — GLUCOSE, CAPILLARY
GLUCOSE-CAPILLARY: 110 mg/dL — AB (ref 65–99)
GLUCOSE-CAPILLARY: 139 mg/dL — AB (ref 65–99)
GLUCOSE-CAPILLARY: 140 mg/dL — AB (ref 65–99)
Glucose-Capillary: 119 mg/dL — ABNORMAL HIGH (ref 65–99)
Glucose-Capillary: 146 mg/dL — ABNORMAL HIGH (ref 65–99)
Glucose-Capillary: 106 mg/dL — ABNORMAL HIGH (ref 65–99)
Glucose-Capillary: 91 mg/dL (ref 65–99)

## 2017-09-15 LAB — TROPONIN I
Troponin I: <0.03 ng/mL (ref <0.03)
Troponin I: <0.03 ng/mL (ref <0.03)
Troponin I: <0.03 ng/mL (ref <0.03)

## 2017-09-15 LAB — LACTIC ACID, PLASMA
Lactic Acid, Venous: 1.1 mmol/L (ref 0.5–1.9)
Lactic Acid, Venous: 1.1 mmol/L (ref 0.5–1.9)

## 2017-09-15 MED ORDER — CALCIUM GLUCONATE 10 % IV SOLN
2.0000 g | Freq: Once | INTRAVENOUS | Status: AC
  Administered 2017-09-15: 2 g via INTRAVENOUS
  Filled 2017-09-15: qty 20

## 2017-09-15 MED ORDER — METOPROLOL TARTRATE 5 MG/5ML IV SOLN: 2.5000 mg | Freq: Three times a day (TID) | INTRAVENOUS | Status: DC

## 2017-09-15 MED ORDER — ENOXAPARIN SODIUM 30 MG/0.3ML ~~LOC~~ SOLN
30.0000 mg | SUBCUTANEOUS | Status: DC
  Administered 2017-09-15 – 2017-09-21 (×7): 30 mg via SUBCUTANEOUS
  Filled 2017-09-15 (×7): qty 0.3

## 2017-09-15 MED ORDER — ASPIRIN 325 MG PO TABS
325.0000 mg | ORAL_TABLET | Freq: Every day | ORAL | Status: DC
  Administered 2017-09-15 – 2017-09-21 (×7): 325 mg via ORAL
  Filled 2017-09-15 (×7): qty 1

## 2017-09-15 MED ORDER — METOPROLOL TARTRATE 5 MG/5ML IV SOLN
5.0000 mg | Freq: Four times a day (QID) | INTRAVENOUS | Status: DC | PRN
  Administered 2017-09-18: 5 mg via INTRAVENOUS
  Filled 2017-09-15: qty 5

## 2017-09-15 MED ORDER — ACETAMINOPHEN 650 MG RE SUPP: 650.0000 mg | RECTAL | Status: DC | PRN

## 2017-09-15 MED ORDER — WHITE PETROLATUM EX OINT
TOPICAL_OINTMENT | CUTANEOUS | Status: AC
  Administered 2017-09-15: 12:00:00
  Filled 2017-09-15: qty 28.35

## 2017-09-15 MED ORDER — ACETAMINOPHEN 160 MG/5ML PO SOLN: 650.0000 mg | ORAL | Status: DC | PRN

## 2017-09-15 MED ORDER — ACETAMINOPHEN 325 MG PO TABS: 650.0000 mg | ORAL_TABLET | ORAL | Status: DC | PRN

## 2017-09-15 MED ORDER — LEVOTHYROXINE SODIUM 100 MCG IV SOLR
75.0000 ug | Freq: Every day | INTRAVENOUS | Status: DC
  Administered 2017-09-15 – 2017-09-16 (×2): 75 ug via INTRAVENOUS
  Filled 2017-09-15 (×2): qty 5

## 2017-09-15 MED ORDER — DEXTROSE 5 % IV SOLN
1.0000 g | INTRAVENOUS | Status: DC
  Administered 2017-09-15 – 2017-09-18 (×4): 1 g via INTRAVENOUS
  Filled 2017-09-15 (×4): qty 10

## 2017-09-15 MED ORDER — STROKE: EARLY STAGES OF RECOVERY BOOK
Freq: Once | Status: AC
  Administered 2017-09-15: 05:00:00

## 2017-09-15 MED ORDER — INSULIN ASPART 100 UNIT/ML ~~LOC~~ SOLN
0.0000 [IU] | SUBCUTANEOUS | Status: DC
  Administered 2017-09-15 – 2017-09-18 (×7): 1 [IU] via SUBCUTANEOUS
  Administered 2017-09-19: 2 [IU] via SUBCUTANEOUS
  Administered 2017-09-19 – 2017-09-20 (×2): 1 [IU] via SUBCUTANEOUS

## 2017-09-15 MED ORDER — SODIUM CHLORIDE 0.9 % IV SOLN
INTRAVENOUS | Status: DC
  Administered 2017-09-15 (×2): via INTRAVENOUS

## 2017-09-15 MED ORDER — ASPIRIN 300 MG RE SUPP: 300.0000 mg | Freq: Every day | RECTAL | Status: DC

## 2017-09-15 NOTE — Progress Notes (Signed)
Pharmacy Antibiotic Note  Caroline Blake is a 72 y.o. female admitted on 09/14/2017 with UTI.  Pharmacy has been consulted for Rocephin dosing.  Plan: Rocephin 1g IV Q24H. Pharmacy will sign off.  Height: 5' 4.5" (163.8 cm) Weight: 139 lb 1.8 oz (63.1 kg) IBW/kg (Calculated) : 55.85  Temp (24hrs), Avg:98.1 F (36.7 C), Min:98 F (36.7 C), Max:98.2 F (36.8 C)  Recent Labs  Lab 09/09/17 09/14/17 1925 09/14/17 1932  WBC 12.2 15.0*  --   CREATININE 2.0* 3.77* 3.90*    Estimated Creatinine Clearance: 11.8 mL/min (A) (by C-G formula based on SCr of 3.9 mg/dL (H)).    Allergies  Allergen Reactions  . Atorvastatin Itching and Other (See Comments)    Myopathy also   . Carvedilol Itching  . Cinoxacin Itching and Other (See Comments)    Hypotension also  . Etodolac Other (See Comments), Itching and Hives  . Metoclopramide Itching  . Moxifloxacin Itching  . Yellow Dye Hives  . Paroxetine Hcl Other (See Comments)    Unknown reaction  . Rosuvastatin Calcium Other (See Comments)    Myalgias  . Nitrofurantoin Other (See Comments)    Yeast infection  . Rivaroxaban Other (See Comments)    GI bleed  . Sulfa Antibiotics Hives and Itching  . Yellow Dyes (Non-Tartrazine) Other (See Comments)    unspecified  . Aspirin-Dipyridamole Er Itching  . Ezetimibe-Simvastatin Itching  . Pitavastatin Itching    Thank you for allowing pharmacy to be a part of this patient's care.  Wynona Neat, PharmD, BCPS  09/15/2017 1:53 AM

## 2017-09-15 NOTE — Progress Notes (Signed)
Patient seen and examined at bedside, patient admitted after midnight, please see earlier detailed admission note by Rise Patience, MD. Briefly, patient presented with concern for recurrent stroke. Patient also found to have an AKI and evidence of urinary tract infection. Patient stable on examination. Patient with significant hypocalcemia and worsening kidney injury with associated worsening BUN. Will obtain PTH, consult nephrology and give calcium gluconate 2g and recheck ionized calcium. Possibly secondary to chronic kidney disease as has been persistently low. Currently has prolonged QTc on EKG. Continue Ceftriaxone for UTI.   Cordelia Poche, MD [facility] 09/15/2017, 10:35 AM Pager: [phone]

## 2017-09-15 NOTE — Progress Notes (Addendum)
Received a call from Lab regarding critical value of Calcium which is 5.6. Notified Dr. Lonny Prude at [phone].

## 2017-09-15 NOTE — Consult Note (Signed)
[facility] Consult Note     Date: 09/15/2017                  Patient Name:  Caroline Blake  MRN: [id number]  DOB: 1946-03-31  Age / Sex: 72 y.o., female         PCP: Drake Leach, MD                 Service Requesting Consult: [facility] Dr. Lonny Prude                 Reason for Consult: AKI on CKD            Chief Complaint: aphasia, weakness HPI: Patient presents from [facility] after recent discharge with CVA. Family c/w weakness and worsened mental status. On orientation questions, patient gets year, state, name correct. Incorrect city, president. No family at bedside. Reports frequent UTIs. Denies frequency. Denies dysuria. Goes to Dr. Loni Muse for CKD she states.  Denies NSAIDs. Doesn't know about hypocalcemia. Hx of normal colonoscopy several times per her report.  Initially denies fractures, but on noticing her RLE in cast, she states she broke it 3 weeks ago.   Past Medical History:  Diagnosis Date  . Anemia   . Arthritis of ankle or foot, degenerative 04/30/2016  . Atrial fibrillation ([place]) 09/2016   with left atrial appendage, failed xarelto due to GIB, unable to have watchman due to nickel allergy and Coumadin never started  . AVM (arteriovenous malformation) of colon with hemorrhage 2018   requiring transfusion --- last one was March 2018 - HgB on 10/22/2016 was 7.3 and on 10/23/2016 was 8.7  . Bacteriuria with pyuria 08/14/2015  . Benign hypertension 1995  . Cancer of kidney ([place])   . Cholelithiasis 2008  . Chronic conjunctivitis 07/04/2012  . Chronic kidney disease   . Clostridium difficile colitis 2017  . Cognitive impairment   . Colon polyp 2005   and 11/16/2016  . COPD (chronic obstructive pulmonary disease) ([place])    and currenlty smokes--- refuses to use HS home O2  . Coronary artery disease 2002  . CVA (cerebral vascular accident) ([place])   . Cystitis    INTESTINAL CHRONIC  . DDD (degenerative disc disease), cervical   . Depression   . Diabetes  mellitus ([place]) 1999   HGBA1C on 09/09/2016 was 6.8  . Disease of thyroid gland   . History of kidney cancer    LEFT  . History of pancreatitis   . History of transfusion 10/2016  . Hyperlipidemia   . Hypertension   . Hypothyroidism   . Iron deficiency anemia 09/27/2016  . Left homonymous hemianopsia 07/04/2012  . Lisfranc dislocation, right, initial encounter 08/14/2017  . Memory impairment 09/09/2016  . Nuclear sclerosis of left eye 12/09/2016   added automatically from request for surgery [phone]  . OSA (obstructive sleep apnea)    o2 3L [place] PRN  . Pancreatitis 2008  . Posterior vitreous detachment 07/04/2012  . Postsurgical hypothyroidism    S/P partial thyroidectomy 1978, later went for additional parital thyroidectomy  . Renal cell adenocarcinoma, left ([place]) 2005   [place]  . Retinal drusen 07/04/2012  . Shortness of breath   . Sinus tarsitis of left foot 09/15/2016  . Snores 2005   does not use CPAP regularly  . Stroke [facility]) 2011   LLE sensory, 2012 x2 and 2013 x1; [place]  . TIA (transient ischemic attack) 10/21/2016  . Type 2 diabetes mellitus ([place])   .  Type 2 diabetes mellitus with stage 3 chronic kidney disease, with long-term current use of insulin ([place])    on 10/23/2016 Scr was 1.89, GFR 26 and K+ 4.2    Past Surgical History:  Procedure Laterality Date  . CATARACT EXTRACTION    . CATARACT EXTRACTION W/ INTRAOCULAR LENS IMPLANT Left 01/19/2017   Procedure: PHACOEMULSIFICATION PC / IOL TOPICAL; Surgeon: Sonia Side Burden, MD; Location: [place]; Service: Ophthalmology; Laterality: Left;  . CHOLECYSTECTOMY    . COLONOSCOPY Left 09/22/2016   Procedure: COLONOSCOPY, FLEXIBLE, PROXIMAL TO SPLENIC FLEXURE; DIAGNOSTIC, W/WO COLLECTION SPECIMEN BY BRUSH OR [place]; Surgeon: Ned Card, MD; Location: Endo Procedures [facility]; Service: Gastroenterology  . COLONOSCOPY  11/16/2016   Procedure: COLONOSCOPY, FLEXIBLE, PROXIMAL TO SPLENIC FLEXURE; DIAGNOSTIC, W/WO  COLLECTION SPECIMEN BY BRUSH OR [place]; Surgeon: Ned Card, MD; Location: Endo Procedures [facility]; Service: Gastroenterology  . COLONOSCOPY W/ BIOPSIES  10/07/2016   Procedure: COLONOSCOPY, FLEXIBLE, PROXIMAL TO SPLENIC FLEXURE; WITH BIOPSY, SINGLE OR MULTIPLE; Surgeon: Bing Quarry, MD; Location: Endo Procedures [facility]; Service: Gastroenterology  . CORONARY STENT PLACEMENT  2002  . GALLBLADDER SURGERY  2008  . LOOP RECORDER IMPLANT  2018   medtronic reveal linq  . NEPHRECTOMY Left 2005   renal cancer   . THYROIDECTOMY, PARTIAL  1978   x2  . TOTAL ABDOMINAL HYSTERECTOMY W/ BILATERAL SALPINGOOPHORECTOMY  1984  . TUBAL LIGATION  1980  . UPPER GASTROINTESTINAL ENDOSCOPY Left 09/22/2016   Procedure: UGI ENDO, INCLUDE ESOPHAGUS, STOMACH, & DUODENUM &/OR JEJUNUM; DX W/WO COLLECTION SPECIMN, BY BRUSH OR [place]; Surgeon: Ned Card, MD; Location: Endo Procedures [place]; Service: Gastroenterology    Family History  Problem Relation Age of Onset  . Hyperlipidemia Mother   . Lung cancer Mother   . Aneurysm Mother        abdominal aortic  . Anuerysm Father   . Heart disease Father   . Hyperlipidemia Father   . Stroke Father   . Tremor Father        familial essential  . Hypertension Sister   . Cancer Daughter        Peritoneal carcinomatosis, surviving  . Ovarian cancer Daughter   . Aneurysm Brother        abdominal aortic  . Anesthesia problems Neg Hx   . Arthritis Neg Hx   . Asthma Neg Hx   . Cerebral palsy Neg Hx   . Clotting disorder Neg Hx   . Deep vein thrombosis Neg Hx   . Club foot Neg Hx   . Collagen disease Neg Hx   . Diabetes Neg Hx   . Gait disorder Neg Hx   . Gout Neg Hx   . Hip dysplasia Neg Hx   . Hip fracture Neg Hx   . Hypermobility Neg Hx   . Osteoporosis Neg Hx   . Pulmonary embolism Neg Hx   . Scoliosis Neg Hx   . Spina bifida Neg Hx   . Thyroid disease Neg Hx   . Vasculitis Neg Hx   . Rheumatologic disease Neg Hx    Social History:  reports  that she quit smoking about 10 months ago. Her smoking use included cigarettes. She has a 50.00 pack-year smoking history. she has never used smokeless tobacco. She reports that she does not drink alcohol. Her drug history is not on file.  Allergies:  Allergies  Allergen Reactions  . Atorvastatin Itching and Other (See Comments)    Myopathy also   . Carvedilol Itching  .  Cinoxacin Itching and Other (See Comments)    Hypotension also  . Etodolac Other (See Comments), Itching and Hives  . Metoclopramide Itching  . Moxifloxacin Itching  . Yellow Dye Hives  . Paroxetine Hcl Other (See Comments)    Unknown reaction  . Rosuvastatin Calcium Other (See Comments)    Myalgias  . Nitrofurantoin Other (See Comments)    Yeast infection  . Rivaroxaban Other (See Comments)    GI bleed  . Sulfa Antibiotics Hives and Itching  . Yellow Dyes (Non-Tartrazine) Other (See Comments)    unspecified  . Aspirin-Dipyridamole Er Itching  . Ezetimibe-Simvastatin Itching  . Pitavastatin Itching    Medications Prior to Admission  Medication Sig Dispense Refill  . buPROPion (WELLBUTRIN SR) 150 MG 12 hr tablet Take 150 mg by mouth every morning.  0  . clopidogrel (PLAVIX) 75 MG tablet Take 75 mg by mouth daily.  1  . conjugated estrogens (PREMARIN) vaginal cream Place 1 g vaginally daily as needed for itching.    . donepezil (ARICEPT) 10 MG tablet Take 5 mg by mouth daily.  0  . doxycycline (VIBRAMYCIN) 100 MG capsule Take 100 mg by mouth 2 (two) times daily.  0  . ezetimibe (ZETIA) 10 MG tablet Take 10 mg by mouth daily.    . insulin aspart protamine- aspart (NOVOLOG MIX 70/30) (70-30) 100 UNIT/ML injection Inject 3 Units into the skin 2 (two) times daily.    Derrill Memo ON 09/17/2017] lamoTRIgine (LAMICTAL) 100 MG tablet Take 1 tablet (100 mg total) by mouth 2 (two) times daily. 60 tablet 1  . lamoTRIgine (LAMICTAL) 25 MG tablet Take 1 tablet (25 mg total) by mouth 2 (two) times daily. 10 tablet 1  .  lamoTRIgine (LAMICTAL) 25 MG tablet Take 2 tablets (50 mg total) by mouth 2 (two) times daily. 20 tablet 1  . levothyroxine (SYNTHROID, LEVOTHROID) 150 MCG tablet Take 150 mcg by mouth daily.  1  . metoprolol tartrate (LOPRESSOR) 50 MG tablet Take 50 mg by mouth daily.    . metroNIDAZOLE (METROGEL) 0.75 % vaginal gel Place 1 Applicatorful vaginally daily as needed for itching.  0  . mirabegron ER (MYRBETRIQ) 50 MG TB24 tablet Take 25 mg by mouth daily.    . Omega-3 Fatty Acids (FISH OIL PO) Take 1 capsule by mouth daily.    . phenazopyridine (PYRIDIUM) 200 MG tablet Take 200 mg by mouth daily as needed. UTI  0  . pravastatin (PRAVACHOL) 80 MG tablet Take 80 mg by mouth daily.  11  . sertraline (ZOLOFT) 100 MG tablet Take 200 mg by mouth daily.  3  . traMADol (ULTRAM) 50 MG tablet Take 50 mg by mouth 3 (three) times daily as needed for pain.      Results for orders placed or performed during the hospital encounter of 09/14/17 (from the past 48 hour(s))  Ethanol     Status: None   Collection Time: 09/14/17  7:25 PM  Result Value Ref Range   Alcohol, Ethyl (B) <10 <10 mg/dL    Comment:        LOWEST DETECTABLE LIMIT FOR SERUM ALCOHOL IS 10 mg/dL FOR MEDICAL PURPOSES ONLY Performed at [facility] [address] [place], [place] [address]   Protime-INR     Status: Abnormal   Collection Time: 09/14/17  7:25 PM  Result Value Ref Range   Prothrombin Time 15.6 (H) 11.4 - 15.2 seconds   INR 1.25     Comment: Performed at  [facility], [place] [address] [place], [place] [address]  APTT     Status: None   Collection Time: 09/14/17  7:25 PM  Result Value Ref Range   aPTT 27 24 - 36 seconds    Comment: Performed at [facility] [address] [place], [place] [address]  CBC     Status: Abnormal   Collection Time: 09/14/17  7:25 PM  Result Value Ref Range   WBC 15.0 (H) 4.0 - 10.5 K/uL   RBC 5.40 (H) 3.87 - 5.11 MIL/uL   Hemoglobin 13.8 12.0 - 15.0 g/dL   HCT 43.1 36.0 -  46.0 %   MCV 79.8 78.0 - 100.0 fL   MCH 25.6 (L) 26.0 - 34.0 pg   MCHC 32.0 30.0 - 36.0 g/dL   RDW 17.3 (H) 11.5 - 15.5 %   Platelets 292 150 - 400 K/uL    Comment: Performed at [facility] [address] [place], [place] [address]  Differential     Status: Abnormal   Collection Time: 09/14/17  7:25 PM  Result Value Ref Range   Neutrophils Relative % 85 %   Neutro Abs 12.8 (H) 1.7 - 7.7 K/uL   Lymphocytes Relative 9 %   Lymphs Abs 1.3 0.7 - 4.0 K/uL   Monocytes Relative 6 %   Monocytes Absolute 0.9 0.1 - 1.0 K/uL   Eosinophils Relative 0 %   Eosinophils Absolute 0.0 0.0 - 0.7 K/uL   Basophils Relative 0 %   Basophils Absolute 0.0 0.0 - 0.1 K/uL    Comment: Performed at [facility] [address] [place], [place] [address]  Comprehensive metabolic panel     Status: Abnormal   Collection Time: 09/14/17  7:25 PM  Result Value Ref Range   Sodium 140 135 - 145 mmol/L   Potassium 4.0 3.5 - 5.1 mmol/L   Chloride 105 101 - 111 mmol/L   CO2 16 (L) 22 - 32 mmol/L   Glucose, Bld 137 (H) 65 - 99 mg/dL   BUN 101 (H) 6 - 20 mg/dL   Creatinine, Ser 3.77 (H) 0.44 - 1.00 mg/dL   Calcium 5.8 (LL) 8.9 - 10.3 mg/dL    Comment: CRITICAL RESULT CALLED TO, READ BACK BY AND VERIFIED WITH: Adora Fridge 2022 09/14/17 D BRADLEY    Total Protein 4.9 (L) 6.5 - 8.1 g/dL   Albumin 2.5 (L) 3.5 - 5.0 g/dL   AST 26 15 - 41 U/L   ALT 15 14 - 54 U/L   Alkaline Phosphatase 178 (H) 38 - 126 U/L   Total Bilirubin 0.5 0.3 - 1.2 mg/dL   GFR calc non Af Amer 11 (L) >60 mL/min   GFR calc Af Amer 13 (L) >60 mL/min    Comment: (NOTE) The eGFR has been calculated using the CKD EPI equation. This calculation has not been validated in all clinical situations. eGFR's persistently <60 mL/min signify possible Chronic Kidney Disease.    Anion gap 19 (H) 5 - 15    Comment: Performed at [facility], [place] [address] [place],  [address]  I-stat troponin, ED     Status: None   Collection  Time: 09/14/17  7:30 PM  Result Value Ref Range   Troponin i, poc 0.00 0.00 - 0.08 ng/mL   Comment 3            Comment: Due to the release kinetics of cTnI, a negative result within the  first hours of the onset of symptoms does not rule out myocardial infarction with certainty. If myocardial infarction is still suspected, repeat the test at appropriate intervals.   I-Stat Chem 8, ED     Status: Abnormal   Collection Time: 09/14/17  7:32 PM  Result Value Ref Range   Sodium 139 135 - 145 mmol/L   Potassium 4.0 3.5 - 5.1 mmol/L   Chloride 105 101 - 111 mmol/L   BUN 87 (H) 6 - 20 mg/dL   Creatinine, Ser 3.90 (H) 0.44 - 1.00 mg/dL   Glucose, Bld 134 (H) 65 - 99 mg/dL   Calcium, Ion 0.69 (LL) 1.15 - 1.40 mmol/L   TCO2 18 (L) 22 - 32 mmol/L   Hemoglobin 15.0 12.0 - 15.0 g/dL   HCT 44.0 36.0 - 46.0 %  Urine rapid drug screen (hosp performed)     Status: None   Collection Time: 09/14/17  9:27 PM  Result Value Ref Range   Opiates NONE DETECTED NONE DETECTED   Cocaine NONE DETECTED NONE DETECTED   Benzodiazepines NONE DETECTED NONE DETECTED   Amphetamines NONE DETECTED NONE DETECTED   Tetrahydrocannabinol NONE DETECTED NONE DETECTED   Barbiturates NONE DETECTED NONE DETECTED    Comment: (NOTE) DRUG SCREEN FOR MEDICAL PURPOSES ONLY.  IF CONFIRMATION IS NEEDED FOR ANY PURPOSE, NOTIFY LAB WITHIN 5 DAYS. LOWEST DETECTABLE LIMITS FOR URINE DRUG SCREEN Drug Class                     Cutoff (ng/mL) Amphetamine and metabolites    1000 Barbiturate and metabolites    200 Benzodiazepine                 295 Tricyclics and metabolites     300 Opiates and metabolites        300 Cocaine and metabolites        300 THC                            50 Performed at [facility], [place] [address] [place], [place] [address]   Urinalysis, Routine w reflex microscopic     Status: Abnormal   Collection Time: 09/14/17  9:27 PM  Result Value Ref Range   Color, Urine AMBER (A) YELLOW    Comment:  BIOCHEMICALS MAY BE AFFECTED BY COLOR   APPearance TURBID (A) CLEAR   Specific Gravity, Urine 1.010 1.005 - 1.030   pH 8.0 5.0 - 8.0   Glucose, UA NEGATIVE NEGATIVE mg/dL   Hgb urine dipstick LARGE (A) NEGATIVE   Bilirubin Urine SMALL (A) NEGATIVE   Ketones, ur NEGATIVE NEGATIVE mg/dL   Protein, ur 30 (A) NEGATIVE mg/dL   Nitrite NEGATIVE NEGATIVE   Leukocytes, UA MODERATE (A) NEGATIVE   RBC / HPF TOO NUMEROUS TO COUNT 0 - 5 RBC/hpf   WBC, UA 6-30 0 - 5 WBC/hpf   Bacteria, UA MANY (A) NONE SEEN   Squamous Epithelial / LPF NONE SEEN NONE SEEN    Comment: Performed at [facility], [place] [address] [place], [place] [address]  Glucose, capillary     Status: Abnormal   Collection Time: 09/15/17  4:19 AM  Result Value Ref Range   Glucose-Capillary 119 (H) 65 - 99 mg/dL   Comment 1 Notify RN    Comment 2 Document in Chart   Glucose, capillary     Status: Abnormal   Collection Time: 09/15/17  7:55 AM  Result Value Ref Range   Glucose-Capillary 110 (H) 65 - 99 mg/dL  Comprehensive metabolic panel     Status: Abnormal   Collection Time: 09/15/17  8:06 AM  Result Value Ref Range   Sodium 141 135 - 145 mmol/L   Potassium 3.4 (L) 3.5 - 5.1 mmol/L   Chloride 107 101 - 111 mmol/L   CO2 11 (L) 22 - 32 mmol/L   Glucose, Bld 124 (H) 65 - 99 mg/dL   BUN 121 (H) 6 - 20 mg/dL   Creatinine, Ser 4.55 (H) 0.44 - 1.00 mg/dL   Calcium 5.6 (LL) 8.9 - 10.3 mg/dL    Comment: CRITICAL RESULT CALLED TO, READ BACK BY AND VERIFIED WITH: AUSTR[place]IA,M RN @ 1011 09/15/17 LEONARD,A    Total Protein 4.7 (L) 6.5 - 8.1 g/dL   Albumin 2.3 (L) 3.5 - 5.0 g/dL   AST 24 15 - 41 U/L   ALT 18 14 - 54 U/L   Alkaline Phosphatase 173 (H) 38 - 126 U/L   Total Bilirubin 0.3 0.3 - 1.2 mg/dL   GFR calc non Af Amer 9 (L) >60 mL/min   GFR calc Af Amer 10 (L) >60 mL/min    Comment: (NOTE) The eGFR has been calculated using the CKD EPI equation. This calculation has not been validated in all clinical situations. eGFR's  persistently <60 mL/min signify possible Chronic Kidney Disease.    Anion gap 23 (H) 5 - 15    Comment: Performed at [facility], [place] [address], [place], [place] [address]  CBC     Status: Abnormal   Collection Time: 09/15/17  8:06 AM  Result Value Ref Range   WBC 14.3 (H) 4.0 - 10.5 K/uL   RBC 5.34 (H) 3.87 - 5.11 MIL/uL   Hemoglobin 13.8 12.0 - 15.0 g/dL   HCT 42.4 36.0 - 46.0 %   MCV 79.4 78.0 - 100.0 fL   MCH 25.8 (L) 26.0 - 34.0 pg   MCHC 32.5 30.0 - 36.0 g/dL   RDW 17.8 (H) 11.5 - 15.5 %   Platelets 294 150 - 400 K/uL    Comment: Performed at [facility] [address], [place], [place] [address]  Troponin I (q 6hr x 3)     Status: None   Collection Time: 09/15/17  8:06 AM  Result Value Ref Range   Troponin I <0.03 <0.03 ng/mL    Comment: Performed at [facility] [address] [place], [place] [address]  Lactic acid, plasma     Status: None   Collection Time: 09/15/17  8:06 AM  Result Value Ref Range   Lactic Acid, Venous 1.1 0.5 - 1.9 mmol/L    Comment: Performed at [facility] [address] [place], [place] [address]  Lactic acid, plasma     Status: None   Collection Time: 09/15/17 11:29 AM  Result Value Ref Range   Lactic Acid, Venous 1.1 0.5 - 1.9 mmol/L    Comment: Performed at [facility] [address], [place], [place] [address]  Glucose, capillary     Status: Abnormal   Collection Time: 09/15/17 11:29 AM  Result Value Ref Range   Glucose-Capillary 140 (H) 65 - 99 mg/dL  Troponin I (q 6hr x 3)     Status: None   Collection Time: 09/15/17  1:06 PM  Result Value Ref Range   Troponin I <0.03 <0.03 ng/mL    Comment: Performed  at [facility], [place] [address], [place], [place] [address]   Dg Chest 2 View  Result Date: 09/14/2017 CLINICAL DATA:  Increased slurred speech and altered mental status. EXAM: CHEST  2 VIEW COMPARISON:  09/03/2017. FINDINGS: Normal heart size. Loop recorder. Clear lung fields. Thoracic  atherosclerosis. No osseous findings. IMPRESSION: No active cardiopulmonary disease.  Stable exam. Electronically Signed   By: Staci Righter M.D.   On: 09/14/2017 20:22   Ct Head Wo Contrast  Result Date: 09/14/2017 CLINICAL DATA:  Increased slurred speech and altered mental status EXAM: CT HEAD WITHOUT CONTRAST TECHNIQUE: Contiguous axial images were obtained from the base of the skull through the vertex without intravenous contrast. COMPARISON:  09/04/2017, 09/02/2017, 09/01/2017, 10/22/2016 FINDINGS: Brain: Multifocal old infarcts involving the right temporal lobe, right parietal lobe, inferior left frontal lobe, and left sub insula and periventricular white matter. Hypodensity within the left posterior insula and medial posterior temporal lobe, corresponding to the infarct demonstrated on MRI 09/04/2016. Left cerebellar infarct again demonstrated. No hemorrhage. No mass. No midline shift. Moderate small vessel ischemic changes of the white matter. Moderate atrophy. Vascular: No hyperdense vessels.  Carotid artery calcification. Skull: No fracture.  Hyperostosis. Sinuses/Orbits: Mild mucosal thickening in the ethmoid sinuses. No acute orbital abnormality. Left lens extraction. Other: None IMPRESSION: 1. Left cerebellar, left posterior insula, and left posteromedial temporal lobe infarcts as noted on recent MRI 09/04/2017; negative for hemorrhage or significant mass effect. 2. Multifocal old bilateral infarcts, atrophy, and small vessel ischemic changes of the white matter. Electronically Signed   By: Donavan Foil M.D.   On: 09/14/2017 20:17   Mr Brain Wo Contrast  Result Date: 09/15/2017 CLINICAL DATA:  Increasing weakness and aphasia. EXAM: MRI HEAD WITHOUT CONTRAST TECHNIQUE: Multiplanar, multiecho pulse sequences of the brain and surrounding structures were obtained without intravenous contrast. COMPARISON:  CT head 09/14/2017. MRI 09/04/2017. FINDINGS: Brain: Involuting restricted diffusion of the  previously identified infarcts in the LEFT cerebellum, RIGHT cerebellum, LEFT insula, LEFT posterior frontoparietal cortex and white matter, and LEFT occipital cortex. The LEFT cerebellar area of cortical restricted diffusion is slightly better seen on today's study than priors, but this could be due to slice selection; no change in distribution. No new areas of acute infarction are seen. Chronic bihemispheric infarcts are stable. Atrophy and small vessel disease. Vascular: Flow voids are maintained throughout the carotid, basilar, and vertebral arteries. Tiny chronic hemorrhage RIGHT thalamus. Slight chronic hemorrhage lines the bed of the old RIGHT hemisphere infarct. Skull and upper cervical spine: Normal marrow signal. Sinuses/Orbits: Negative. Other: None. IMPRESSION: Involuting areas of restricted diffusion consistent with typical evolutionary change for acute into subacute infarction. No hemorrhagic transformation. No new areas of ischemia. Electronically Signed   By: Staci Righter M.D.   On: 09/15/2017 08:20    Review of Systems  Constitutional: Negative for chills and fever.  Eyes: Negative for blurred vision.  Respiratory: Negative for shortness of breath.   Cardiovascular: Negative for chest pain, palpitations, orthopnea and PND.  Gastrointestinal: Negative for abdominal pain, nausea and vomiting.  Genitourinary: Negative for dysuria, frequency and urgency.  Neurological: Positive for speech change, focal weakness and weakness.    Blood pressure 124/74, pulse 74, temperature 98.2 F (36.8 C), temperature source Oral, resp. rate 20, height 5' 4.5" (1.638 m), weight 136 lb 6.4 oz (61.9 kg), SpO2 99 %. Physical Exam  Constitutional: No distress.  HENT:  Head: Normocephalic and atraumatic.  Eyes: EOM are normal. Pupils are equal, round, and  reactive to light.  Neck: Normal range of motion. Neck supple.  Cardiovascular: Normal rate and normal heart sounds.  No murmur heard. Respiratory:  Breath sounds normal. No respiratory distress.  GI: Soft. Bowel sounds are normal. She exhibits no distension. There is no tenderness.  Musculoskeletal: Normal range of motion.  Neurological: She is alert.  RUE weakness and L facial droop  Skin: Skin is warm and dry.  Psychiatric: She has a normal mood and affect. Her behavior is normal.     Assessment/Plan LEAUNA SHARBER is a 72 y.o. female with PMH significant for recent stroke status post TPA, paroxysmal atrial fibrillation, history of GI bleed secondary to AVMs, hypertension, hypothyroidism, hyperlipidemia, R renal cancer s/p nephrectomy (10 years ago), urethral stricture, who presented with worsening aphasia and weakness.   1. AKI on CKD 3 (baseline in Care Everywhere 1.5-2): UA collected in ED with likely UTI. Cultures in process. 2 unmeasured voids charted so far. No ACEi or NSAID use prior to admission. States she gets frequent UTIs. Cr now 4.55. Some confusion on exam. Differential includes prerenal (possible poor PO given deficits from CVA, although BP stable), intrinsic (AIN 2/2 recent abx exposure during last admission) vs post renal with hx of ureteral stricture.  -continue IV hydration -bladder scan now -renal US pending -consider foley if retention -Daily RFPs 2. Hypocalcemia: has been present in [place] labs since 07/2016. Ionized calcium low at 3.9. Differential includes inadequate PTH production or secretion, PTH resistance, vitamin D deficiency or resistance, abnormal magnesium metabolism, or to extravascular deposition of calcium. PTH will aid in narrowing differential. Phosphorus elevated at 5.7 from 6.6. Chart states surgical hypothyroidism, curious about parathyroids (no surgical note in chart). Hypomagnesemic to 1.7.  -replete Mg 2g IV -getting calcium gluconate IV per primary -consider adding Tums -PTH pending -consider checking Vit D   3. Anemia: hx of multiple GI bleeds.  4. Recent CVA per primary and  neurology.   Ralene Ok, MD 09/15/17

## 2017-09-15 NOTE — Evaluation (Signed)
Clinical/Bedside Swallow Evaluation Patient Details  Name: Caroline Blake MRN: [id number] Date of Birth: 12/28/1945  Today's Date: 09/15/2017 Time: SLP Start Time (ACUTE ONLY): 6073 SLP Stop Time (ACUTE ONLY): 0900 SLP Time Calculation (min) (ACUTE ONLY): 23 min  Past Medical History:  Past Medical History:  Diagnosis Date  . Anemia   . Arthritis of ankle or foot, degenerative 04/30/2016  . Atrial fibrillation ([place]) 09/2016   with left atrial appendage, failed xarelto due to GIB, unable to have watchman due to nickel allergy and Coumadin never started  . AVM (arteriovenous malformation) of colon with hemorrhage 2018   requiring transfusion --- last one was March 2018 - HgB on 10/22/2016 was 7.3 and on 10/23/2016 was 8.7  . Bacteriuria with pyuria 08/14/2015  . Benign hypertension 1995  . Cancer of kidney ([place])   . Cholelithiasis 2008  . Chronic conjunctivitis 07/04/2012  . Chronic kidney disease   . Clostridium difficile colitis 2017  . Cognitive impairment   . Colon polyp 2005   and 11/16/2016  . COPD (chronic obstructive pulmonary disease) ([place])    and currenlty smokes--- refuses to use HS home O2  . Coronary artery disease 2002  . CVA (cerebral vascular accident) ([place])   . Cystitis    INTESTINAL CHRONIC  . DDD (degenerative disc disease), cervical   . Depression   . Diabetes mellitus ([place]) 1999   HGBA1C on 09/09/2016 was 6.8  . Disease of thyroid gland   . History of kidney cancer    LEFT  . History of pancreatitis   . History of transfusion 10/2016  . Hyperlipidemia   . Hypertension   . Hypothyroidism   . Iron deficiency anemia 09/27/2016  . Left homonymous hemianopsia 07/04/2012  . Lisfranc dislocation, right, initial encounter 08/14/2017  . Memory impairment 09/09/2016  . Nuclear sclerosis of left eye 12/09/2016   added automatically from request for surgery [phone]  . OSA (obstructive sleep apnea)    o2 3L [place] PRN  . Pancreatitis 2008  . Posterior vitreous  detachment 07/04/2012  . Postsurgical hypothyroidism    S/P partial thyroidectomy 1978, later went for additional parital thyroidectomy  . Renal cell adenocarcinoma, left ([place]) 2005   [place]  . Retinal drusen 07/04/2012  . Shortness of breath   . Sinus tarsitis of left foot 09/15/2016  . Snores 2005   does not use CPAP regularly  . Stroke [facility]) 2011   LLE sensory, 2012 x2 and 2013 x1; [place]  . TIA (transient ischemic attack) 10/21/2016  . Type 2 diabetes mellitus ([place])   . Type 2 diabetes mellitus with stage 3 chronic kidney disease, with long-term current use of insulin ([place])    on 10/23/2016 Scr was 1.89, GFR 26 and K+ 4.2   Past Surgical History:  Past Surgical History:  Procedure Laterality Date  . CATARACT EXTRACTION    . CATARACT EXTRACTION W/ INTRAOCULAR LENS IMPLANT Left 01/19/2017   Procedure: PHACOEMULSIFICATION PC / IOL TOPICAL; Surgeon: Sonia Side Burden, MD; Location: [place]; Service: Ophthalmology; Laterality: Left;  . CHOLECYSTECTOMY    . COLONOSCOPY Left 09/22/2016   Procedure: COLONOSCOPY, FLEXIBLE, PROXIMAL TO SPLENIC FLEXURE; DIAGNOSTIC, W/WO COLLECTION SPECIMEN BY BRUSH OR [place]; Surgeon: Ned Card, MD; Location: Endo Procedures [facility]; Service: Gastroenterology  . COLONOSCOPY  11/16/2016   Procedure: COLONOSCOPY, FLEXIBLE, PROXIMAL TO SPLENIC FLEXURE; DIAGNOSTIC, W/WO COLLECTION SPECIMEN BY BRUSH OR [place]; Surgeon: Ned Card, MD; Location: Endo Procedures [facility]; Service: Gastroenterology  . COLONOSCOPY W/ BIOPSIES  10/07/2016  Procedure: COLONOSCOPY, FLEXIBLE, PROXIMAL TO SPLENIC FLEXURE; WITH BIOPSY, SINGLE OR MULTIPLE; Surgeon: Bing Quarry, MD; Location: Endo Procedures [facility]; Service: Gastroenterology  . CORONARY STENT PLACEMENT  2002  . GALLBLADDER SURGERY  2008  . LOOP RECORDER IMPLANT  2018   medtronic reveal linq  . NEPHRECTOMY Left 2005   renal cancer   . THYROIDECTOMY, PARTIAL  1978   x2  . TOTAL ABDOMINAL HYSTERECTOMY  W/ BILATERAL SALPINGOOPHORECTOMY  1984  . TUBAL LIGATION  1980  . UPPER GASTROINTESTINAL ENDOSCOPY Left 09/22/2016   Procedure: UGI ENDO, INCLUDE ESOPHAGUS, STOMACH, & DUODENUM &/OR JEJUNUM; DX W/WO COLLECTION SPECIMN, BY BRUSH OR [place]; Surgeon: Ned Card, MD; Location: Endo Procedures [place]; Service: Gastroenterology   HPI:  Caroline Knights Smithis a 72 y.o.femalewithhistory of recent stroke status post TPA, paroxysmal atrial fibrillation, history of GI bleed secondary to AVMs, hypertension, hyperlipidemia was brought to the ER after patient's family found that patient was getting increasingly worsening a aphasia from baseline with increasing weakness . Patient did not have any other new weakness of the extremities. Patient has had recently fracture of the right lower extremity. MRI does not show any new infarct or bleed. Swallow on prior assessment/admission was WNL.    Assessment / Plan / Recommendation Clinical Impression  Pt presents with normal swallow function, consistent with prior assessment when admitted for stroke. Pt may initiate a regular diet and thin liquids with assist for self feeding. No SLP f/u needed acutely, recommend continuing any home health/outpatient tx for language.  SLP Visit Diagnosis: Aphasia (R47.01)    Aspiration Risk       Diet Recommendation          Other  Recommendations Oral Care Recommendations: Oral care BID   Follow up Recommendations Home health SLP      Frequency and Duration            Prognosis        Swallow Study   General HPI: Caroline Brose Smithis a 72 y.o.femalewithhistory of recent stroke status post TPA, paroxysmal atrial fibrillation, history of GI bleed secondary to AVMs, hypertension, hyperlipidemia was brought to the ER after patient's family found that patient was getting increasingly worsening a aphasia from baseline with increasing weakness . Patient did not have any other new weakness of the extremities. Patient has  had recently fracture of the right lower extremity. MRI does not show any new infarct or bleed. Swallow on prior assessment/admission was WNL.  Type of Study: Bedside Swallow Evaluation Diet Prior to this Study: NPO Temperature Spikes Noted: No Respiratory Status: Nasal cannula History of Recent Intubation: No Behavior/Cognition: Alert;Cooperative;Pleasant mood Oral Care Completed by SLP: Recent completion by staff Oral Cavity - Dentition: Adequate natural dentition Vision: Functional for self-feeding Self-Feeding Abilities: Needs assist;Needs set up Patient Positioning: Upright in bed Baseline Vocal Quality: Normal Volitional Cough: Strong Volitional Swallow: Able to elicit    Oral/Motor/Sensory Function Overall Oral Motor/Sensory Function: Within functional limits   Ice Chips     Thin Liquid Thin Liquid: Within functional limits Presentation: Straw;Cup    Nectar Thick Nectar Thick Liquid: Not tested   Honey Thick Honey Thick Liquid: Not tested   Puree Puree: Within functional limits   Solid   GO   Solid: Within functional limits        Admiral Marcucci, Katherene Ponto 09/15/2017,10:15 AM

## 2017-09-15 NOTE — ED Notes (Signed)
Pt had loose/wet stool

## 2017-09-15 NOTE — H&P (Addendum)
History and Physical    Caroline Blake DOB: [birth year] DOA: 09/14/2017  PCP: Drake Leach, MD  Patient coming from: [place].  Chief Complaint: Increasing weakness and aphasia.  HPI: Caroline Blake is a 72 y.o. female with history of recent stroke status post TPA, paroxysmal atrial fibrillation, history of GI bleed secondary to AVMs, hypertension, hyperlipidemia was brought to the ER after patient's family found that patient was getting increasingly worsening a aphasia from baseline with increasing weakness noticed last evening around 5 PM.  Patient did not have any other new weakness of the extremities.  Patient has had recently fracture of the right lower extremity.  And also has been recently admitted for aspiration pneumonia.  ED Course: In the ER patient had some difficulty speaking and had failed swallow evaluation.  CT of the head was unremarkable.  On-call neurologist was consulted and neurologist recommended MRI brain and if positive for stroke then to call stroke team.  In addition patient's labs show worsening renal function from baseline and patient was placed on IV fluids.  UA shows features concerning for UTI.  Review of Systems: As per HPI, rest all negative.   Past Medical History:  Diagnosis Date  . Anemia   . Arthritis of ankle or foot, degenerative 04/30/2016  . Atrial fibrillation ([place]) 09/2016   with left atrial appendage, failed xarelto due to GIB, unable to have watchman due to nickel allergy and Coumadin never started  . AVM (arteriovenous malformation) of colon with hemorrhage 2018   requiring transfusion --- last one was March 2018 - HgB on 10/22/2016 was 7.3 and on 10/23/2016 was 8.7  . Bacteriuria with pyuria 08/14/2015  . Benign hypertension 1995  . Cancer of kidney ([place])   . Cholelithiasis 2008  . Chronic conjunctivitis 07/04/2012  . Chronic kidney disease   . Clostridium difficile colitis 2017  . Cognitive impairment   .  Colon polyp 2005   and 11/16/2016  . COPD (chronic obstructive pulmonary disease) ([place])    and currenlty smokes--- refuses to use HS home O2  . Coronary artery disease 2002  . CVA (cerebral vascular accident) ([place])   . Cystitis    INTESTINAL CHRONIC  . DDD (degenerative disc disease), cervical   . Depression   . Diabetes mellitus ([place]) 1999   HGBA1C on 09/09/2016 was 6.8  . Disease of thyroid gland   . History of kidney cancer    LEFT  . History of pancreatitis   . History of transfusion 10/2016  . Hyperlipidemia   . Hypertension   . Hypothyroidism   . Iron deficiency anemia 09/27/2016  . Left homonymous hemianopsia 07/04/2012  . Lisfranc dislocation, right, initial encounter 08/14/2017  . Memory impairment 09/09/2016  . Nuclear sclerosis of left eye 12/09/2016   added automatically from request for surgery [id number]  . OSA (obstructive sleep apnea)    o2 3L [place] PRN  . Pancreatitis 2008  . Posterior vitreous detachment 07/04/2012  . Postsurgical hypothyroidism    S/P partial thyroidectomy 1978, later went for additional parital thyroidectomy  . Renal cell adenocarcinoma, left ([place]) 2005   [place]  . Retinal drusen 07/04/2012  . Shortness of breath   . Sinus tarsitis of left foot 09/15/2016  . Snores 2005   does not use CPAP regularly  . Stroke [facility]) 2011   LLE sensory, 2012 x2 and 2013 x1; [place]  . TIA (transient ischemic attack) 10/21/2016  . Type 2 diabetes mellitus ([place])   .  Type 2 diabetes mellitus with stage 3 chronic kidney disease, with long-term current use of insulin ([place])    on 10/23/2016 Scr was 1.89, GFR 26 and K+ 4.2    Past Surgical History:  Procedure Laterality Date  . CATARACT EXTRACTION    . CATARACT EXTRACTION W/ INTRAOCULAR LENS IMPLANT Left 01/19/2017   Procedure: PHACOEMULSIFICATION PC / IOL TOPICAL; Surgeon: Sonia Side Burden, MD; Location: [facility]; Service: Ophthalmology; Laterality: Left;  . CHOLECYSTECTOMY    . COLONOSCOPY Left  09/22/2016   Procedure: COLONOSCOPY, FLEXIBLE, PROXIMAL TO SPLENIC FLEXURE; DIAGNOSTIC, W/WO COLLECTION SPECIMEN BY BRUSH OR [place]; Surgeon: Ned Card, MD; Location: Endo Procedures [facility]; Service: Gastroenterology  . COLONOSCOPY  11/16/2016   Procedure: COLONOSCOPY, FLEXIBLE, PROXIMAL TO SPLENIC FLEXURE; DIAGNOSTIC, W/WO COLLECTION SPECIMEN BY BRUSH OR [place]; Surgeon: Ned Card, MD; Location: Endo Procedures [facility]; Service: Gastroenterology  . COLONOSCOPY W/ BIOPSIES  10/07/2016   Procedure: COLONOSCOPY, FLEXIBLE, PROXIMAL TO SPLENIC FLEXURE; WITH BIOPSY, SINGLE OR MULTIPLE; Surgeon: Bing Quarry, MD; Location: Endo Procedures [facility]; Service: Gastroenterology  . CORONARY STENT PLACEMENT  2002  . GALLBLADDER SURGERY  2008  . LOOP RECORDER IMPLANT  2018   medtronic reveal linq  . NEPHRECTOMY Left 2005   renal cancer   . THYROIDECTOMY, PARTIAL  1978   x2  . TOTAL ABDOMINAL HYSTERECTOMY W/ BILATERAL SALPINGOOPHORECTOMY  1984  . TUBAL LIGATION  1980  . UPPER GASTROINTESTINAL ENDOSCOPY Left 09/22/2016   Procedure: UGI ENDO, INCLUDE ESOPHAGUS, STOMACH, & DUODENUM &/OR JEJUNUM; DX W/WO COLLECTION SPECIMN, BY BRUSH OR [place]; Surgeon: Ned Card, MD; Location: Endo Procedures [place]; Service: Gastroenterology     reports that she quit smoking about 10 months ago. Her smoking use included cigarettes. She has a 50.00 pack-year smoking history. she has never used smokeless tobacco. She reports that she does not drink alcohol. Her drug history is not on file.  Allergies  Allergen Reactions  . Atorvastatin Itching and Other (See Comments)    Myopathy also   . Carvedilol Itching  . Cinoxacin Itching and Other (See Comments)    Hypotension also  . Etodolac Other (See Comments), Itching and Hives  . Metoclopramide Itching  . Moxifloxacin Itching  . Yellow Dye Hives  . Paroxetine Hcl Other (See Comments)    Unknown reaction  . Rosuvastatin Calcium Other (See Comments)     Myalgias  . Nitrofurantoin Other (See Comments)    Yeast infection  . Rivaroxaban Other (See Comments)    GI bleed  . Sulfa Antibiotics Hives and Itching  . Yellow Dyes (Non-Tartrazine) Other (See Comments)    unspecified  . Aspirin-Dipyridamole Er Itching  . Ezetimibe-Simvastatin Itching  . Pitavastatin Itching    Family History  Problem Relation Age of Onset  . Hyperlipidemia Mother   . Lung cancer Mother   . Aneurysm Mother        abdominal aortic  . Anuerysm Father   . Heart disease Father   . Hyperlipidemia Father   . Stroke Father   . Tremor Father        familial essential  . Hypertension Sister   . Cancer Daughter        Peritoneal carcinomatosis, surviving  . Ovarian cancer Daughter   . Aneurysm Brother        abdominal aortic  . Anesthesia problems Neg Hx   . Arthritis Neg Hx   . Asthma Neg Hx   . Cerebral palsy Neg Hx   . Clotting disorder Neg Hx   .  Deep vein thrombosis Neg Hx   . Club foot Neg Hx   . Collagen disease Neg Hx   . Diabetes Neg Hx   . Gait disorder Neg Hx   . Gout Neg Hx   . Hip dysplasia Neg Hx   . Hip fracture Neg Hx   . Hypermobility Neg Hx   . Osteoporosis Neg Hx   . Pulmonary embolism Neg Hx   . Scoliosis Neg Hx   . Spina bifida Neg Hx   . Thyroid disease Neg Hx   . Vasculitis Neg Hx   . Rheumatologic disease Neg Hx     Prior to Admission medications   Medication Sig Start Date End Date Taking? Authorizing Provider  buPROPion (WELLBUTRIN SR) 150 MG 12 hr tablet Take 150 mg by mouth every morning. 05/28/17   [provider]  clopidogrel (PLAVIX) 75 MG tablet Take 75 mg by mouth daily. 05/27/17   [provider]  conjugated estrogens (PREMARIN) vaginal cream Place 1 g vaginally daily as needed for itching. 09/06/14   [provider]  donepezil (ARICEPT) 10 MG tablet Take 5 mg by mouth daily. 05/27/17   [provider]  doxycycline (VIBRAMYCIN) 100 MG capsule Take 100 mg by mouth 2 (two) times  daily. 08/24/17   [provider]  ezetimibe (ZETIA) 10 MG tablet Take 10 mg by mouth daily. 04/02/13   [provider]  insulin aspart protamine- aspart (NOVOLOG MIX 70/30) (70-30) 100 UNIT/ML injection Inject 3 Units into the skin 2 (two) times daily.    [provider]  lamoTRIgine (LAMICTAL) 100 MG tablet Take 1 tablet (100 mg total) by mouth 2 (two) times daily. 09/17/17   Mary Sella, NP  lamoTRIgine (LAMICTAL) 25 MG tablet Take 1 tablet (25 mg total) by mouth 2 (two) times daily. 09/05/17   Mary Sella, NP  lamoTRIgine (LAMICTAL) 25 MG tablet Take 2 tablets (50 mg total) by mouth 2 (two) times daily. 09/10/17   Mary Sella, NP  levothyroxine (SYNTHROID, LEVOTHROID) 150 MCG tablet Take 150 mcg by mouth daily. 08/12/17   [provider]  metoprolol tartrate (LOPRESSOR) 50 MG tablet Take 50 mg by mouth daily. 03/12/13   [provider]  metroNIDAZOLE (METROGEL) 0.75 % vaginal gel Place 1 Applicatorful vaginally daily as needed for itching. 07/29/17   [provider]  mirabegron ER (MYRBETRIQ) 50 MG TB24 tablet Take 25 mg by mouth daily.    [provider]  Omega-3 Fatty Acids (FISH OIL PO) Take 1 capsule by mouth daily.    [provider]  phenazopyridine (PYRIDIUM) 200 MG tablet Take 200 mg by mouth daily as needed. UTI 08/24/17   [provider]  pravastatin (PRAVACHOL) 80 MG tablet Take 80 mg by mouth daily. 05/27/17   [provider]  sertraline (ZOLOFT) 100 MG tablet Take 200 mg by mouth daily. 08/09/17   [provider]  traMADol (ULTRAM) 50 MG tablet Take 50 mg by mouth 3 (three) times daily as needed for pain. 02/25/17   [provider]    Physical Exam: Vitals:   09/14/17 2100 09/14/17 2300 09/15/17 0030 09/15/17 0131  BP: 119/73 105/67 (!) 125/98 117/65  Pulse: 70 76 69 73  Resp: 14 16 19 19   Temp:    98 F (36.7 C)  TempSrc:    Oral  SpO2: 97% 94% 95% 93%  Weight:     63.1 kg (139 lb 1.8 oz)  Height:    5'  4.5" (1.638 m)      Constitutional: Moderately built and nourished. Vitals:   09/14/17 2100 09/14/17 2300 09/15/17 0030 09/15/17 0131  BP: 119/73 105/67 (!) 125/98 117/65  Pulse: 70 76 69 73  Resp: 14 16 19 19   Temp:    98 F (36.7 C)  TempSrc:    Oral  SpO2: 97% 94% 95% 93%  Weight:    63.1 kg (139 lb 1.8 oz)  Height:    5' 4.5" (1.638 m)   Eyes: Anicteric no pallor. ENMT: No discharge from the ears eyes nose or mouth. Neck: No mass palpated no neck rigidity.  No JVD appreciated. Respiratory: No rhonchi or crepitations. Cardiovascular: S1-S2 heard no murmurs appreciated. Abdomen: Soft nontender bowel sounds present. Musculoskeletal: Right leg in the cast. Skin: No rash. Neurologic: Alert awake oriented to name follows commands moves extremities.  At this time patient is able to talk but mildly slurred.  Appears dry tongue. Psychiatric: Appears mildly confused.   Labs on Admission: I have personally reviewed following labs and imaging studies  CBC: Recent Labs  Lab 09/09/17 09/14/17 1925 09/14/17 1932  WBC 12.2 15.0*  --   NEUTROABS  --  12.8*  --   HGB 12.5 13.8 15.0  HCT 39 43.1 44.0  MCV  --  79.8  --   PLT 229 292  --    Basic Metabolic Panel: Recent Labs  Lab 09/09/17 09/14/17 1925 09/14/17 1932  NA 140 140 139  K 4.0 4.0 4.0  CL  --  105 105  CO2  --  16*  --   GLUCOSE  --  137* 134*  BUN 50* 101* 87*  CREATININE 2.0* 3.77* 3.90*  CALCIUM  --  5.8*  --    GFR: Estimated Creatinine Clearance: 11.8 mL/min (A) (by C-G formula based on SCr of 3.9 mg/dL (H)). Liver Function Tests: Recent Labs  Lab 09/14/17 1925  AST 26  ALT 15  ALKPHOS 178*  BILITOT 0.5  PROT 4.9*  ALBUMIN 2.5*   No results for input(s): LIPASE, AMYLASE in the last 168 hours. No results for input(s): AMMONIA in the last 168 hours. Coagulation Profile: Recent Labs  Lab 09/14/17 1925  INR 1.25   Cardiac Enzymes: No results for  input(s): CKTOTAL, CKMB, CKMBINDEX, TROPONINI in the last 168 hours. BNP (last 3 results) No results for input(s): PROBNP in the last 8760 hours. HbA1C: No results for input(s): HGBA1C in the last 72 hours. CBG: No results for input(s): GLUCAP in the last 168 hours. Lipid Profile: No results for input(s): CHOL, HDL, LDLCALC, TRIG, CHOLHDL, LDLDIRECT in the last 72 hours. Thyroid Function Tests: No results for input(s): TSH, T4TOTAL, FREET4, T3FREE, THYROIDAB in the last 72 hours. Anemia Panel: No results for input(s): VITAMINB12, FOLATE, FERRITIN, TIBC, IRON, RETICCTPCT in the last 72 hours. Urine analysis:    Component Value Date/Time   COLORURINE AMBER (A) 09/14/2017 2127   APPEARANCEUR TURBID (A) 09/14/2017 2127   LABSPEC 1.010 09/14/2017 2127   PHURINE 8.0 09/14/2017 2127   GLUCOSEU NEGATIVE 09/14/2017 2127   HGBUR LARGE (A) 09/14/2017 2127   BILIRUBINUR SMALL (A) 09/14/2017 2127   [place] NEGATIVE 09/14/2017 2127   PROTEINUR 30 (A) 09/14/2017 2127   NITRITE NEGATIVE 09/14/2017 2127   LEUKOCYTESUR MODERATE (A) 09/14/2017 2127   Sepsis Labs: @LABRCNTIP (procalcitonin:4,lacticidven:4) )No results found for this or any previous visit (from the past 240 hour(s)).   Radiological Exams on Admission: Dg Chest 2 View  Result Date: 09/14/2017 CLINICAL DATA:  Increased slurred speech and altered mental status. EXAM: CHEST  2 VIEW COMPARISON:  09/03/2017. FINDINGS: Normal heart size. Loop recorder. Clear lung fields. Thoracic atherosclerosis. No osseous findings. IMPRESSION: No active cardiopulmonary disease.  Stable exam. Electronically Signed   By: Staci Righter M.D.   On: 09/14/2017 20:22   Ct Head Wo Contrast  Result Date: 09/14/2017 CLINICAL DATA:  Increased slurred speech and altered mental status EXAM: CT HEAD WITHOUT CONTRAST TECHNIQUE: Contiguous axial images were obtained from the base of the skull through the vertex without intravenous contrast. COMPARISON:  09/04/2017,  09/02/2017, 09/01/2017, 10/22/2016 FINDINGS: Brain: Multifocal old infarcts involving the right temporal lobe, right parietal lobe, inferior left frontal lobe, and left sub insula and periventricular white matter. Hypodensity within the left posterior insula and medial posterior temporal lobe, corresponding to the infarct demonstrated on MRI 09/04/2016. Left cerebellar infarct again demonstrated. No hemorrhage. No mass. No midline shift. Moderate small vessel ischemic changes of the white matter. Moderate atrophy. Vascular: No hyperdense vessels.  Carotid artery calcification. Skull: No fracture.  Hyperostosis. Sinuses/Orbits: Mild mucosal thickening in the ethmoid sinuses. No acute orbital abnormality. Left lens extraction. Other: None IMPRESSION: 1. Left cerebellar, left posterior insula, and left posteromedial temporal lobe infarcts as noted on recent MRI 09/04/2017; negative for hemorrhage or significant mass effect. 2. Multifocal old bilateral infarcts, atrophy, and small vessel ischemic changes of the white matter. Electronically Signed   By: Donavan Foil M.D.   On: 09/14/2017 20:17    EKG: Independently reviewed.  Normal sinus rhythm with anterolateral infarct age indeterminant.  Prolonged QT.  Assessment/Plan Principal Problem:   ARF (acute renal failure) (HCC) Active Problems:   Stroke (cerebrum) (HCC)   Paroxysmal atrial fibrillation (H[facility])   History of seizure   Vascular dementia with behavior disturbance   Hypertension   Diabetes mellitus ([place])   AVM (arteriovenous malformation) of colon with hemorrhage   Aphasia    1. Aphasia -with recent stroke.  MRI brain is pending.  Patient has known history of paroxysmal atrial fibrillation but is not on anticoagulation secondary to GI bleed with AVMs.  Patient is on Plavix.  Patient presently failed swallow is on aspirin.  Based on MRI further plans. 2. Acute renal failure -while in the hospital patient had 3 episodes of diarrhea.  Likely  contributing to renal failure.  We will gently hydrate and closely follow intake output check FENa.  Follow metabolic panel. 3. Possible UTI on ceftriaxone.  Check urine cultures. 4. Diarrhea on presentation.  If there is any further episodes of diarrhea will need to check stool for C. difficile and other stool studies.  Discussed with patient's nurse. 5. Anion gap metabolic acidosis -could be from renal failure and dehydration and diarrhea.  Check lactic acid levels continue hydration. 6. Abnormal EKG different from previous -patient denies any chest pain but however patient is weak will check troponin. 7. Recent stroke status post TPA. 8. History of right lower extremity fracture was due for surgery later. 9. History of seizures on Lamictal presently n.p.o. since patient failed swallow.  If patient cannot take Lamictal today after swallow will need to replace with Keppra or other medications after discussing with neurologist. 10. History of diabetes mellitus per the chart will keep patient on sliding scale coverage. 11. History of dementia. 12. History of paroxysmal atrial fibrillation not on anticoagulation secondary to GI bleed.  I have placed patient on PRN IV metoprolol for heart rate more than 100. 13. Hyperlipidemia on statins. 14. Hypothyroidism  on Synthroid.  In the medication reconciliation shows that patient is on antibiotics.  We need to discuss with patient's family about if patient is still on those antibiotics in the morning.  Patient is presently on ceftriaxone.   DVT prophylaxis: Lovenox. Code Status: DNR. Family Communication: No family at the bedside. Disposition Plan: Skilled nursing facility. Consults called: Discussed with neurologist. Admission status: Observation.   Rise Patience MD [facility] Pager [phone].  If 7PM-7AM, please contact night-coverage [email or web address] Password [email or web address]  09/15/2017, 1:37 AM

## 2017-09-15 NOTE — Evaluation (Signed)
Speech Language Pathology Evaluation Patient Details Name: Caroline Blake MRN: [id number] DOB: July 12, 1946 Today's Date: 09/15/2017 Time: 0837-0900 SLP Time Calculation (min) (ACUTE ONLY): 23 min  Problem List:  Patient Active Problem List   Diagnosis Date Noted  . Aphasia 09/15/2017  . ARF (acute renal failure) ([place]) 09/14/2017  . Hypertension 09/10/2017  . Hyperlipidemia 09/10/2017  . Depression 09/10/2017  . 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 Overactive bladder 09/10/2017  . Paroxysmal atrial fibrillation (HCC)   . History of stroke   . History of seizure   . Vascular dementia with behavior disturbance   . Cerebral infarction ([place])   . Respiratory failure ([place])   . Goals of care, counseling/discussion   . Palliative care by specialist   . Stroke (cerebrum) ([place]) 09/01/2017  . AVM (arteriovenous malformation) of colon with hemorrhage 08/09/2016  . Diabetes mellitus ([place]) 08/09/1997   Past Medical History:  Past Medical History:  Diagnosis Date  . Anemia   . Arthritis of ankle or foot, degenerative 04/30/2016  . Atrial fibrillation ([place]) 09/2016   with left atrial appendage, failed xarelto due to GIB, unable to have watchman due to nickel allergy and Coumadin never started  . AVM (arteriovenous malformation) of colon with hemorrhage 2018   requiring transfusion --- last one was March 2018 - HgB on 10/22/2016 was 7.3 and on 10/23/2016 was 8.7  . Bacteriuria with pyuria 08/14/2015  . Benign hypertension 1995  . Cancer of kidney ([place])   . Cholelithiasis 2008  . Chronic conjunctivitis 07/04/2012  . Chronic kidney disease   . Clostridium difficile colitis 2017  . Cognitive impairment   . Colon polyp 2005   and 11/16/2016  . COPD (chronic obstructive pulmonary disease) ([place])    and currenlty smokes--- refuses to use HS home O2  . Coronary artery disease 2002  . CVA (cerebral vascular accident) ([place])   . Cystitis    INTESTINAL CHRONIC  . DDD (degenerative disc  disease), cervical   . Depression   . Diabetes mellitus ([place]) 1999   HGBA1C on 09/09/2016 was 6.8  . Disease of thyroid gland   . History of kidney cancer    LEFT  . History of pancreatitis   . History of transfusion 10/2016  . Hyperlipidemia   . Hypertension   . Hypothyroidism   . Iron deficiency anemia 09/27/2016  . Left homonymous hemianopsia 07/04/2012  . Lisfranc dislocation, right, initial encounter 08/14/2017  . Memory impairment 09/09/2016  . Nuclear sclerosis of left eye 12/09/2016   added automatically from request for surgery [id number]  . OSA (obstructive sleep apnea)    o2 3L [place] PRN  . Pancreatitis 2008  . Posterior vitreous detachment 07/04/2012  . Postsurgical hypothyroidism    S/P partial thyroidectomy 1978, later went for additional parital thyroidectomy  . Renal cell adenocarcinoma, left ([place]) 2005   [place]  . Retinal drusen 07/04/2012  . Shortness of breath   . Sinus tarsitis of left foot 09/15/2016  . Snores 2005   does not use CPAP regularly  . Stroke [facility]) 2011   LLE sensory, 2012 x2 and 2013 x1; [place]  . TIA (transient ischemic attack) 10/21/2016  . Type 2 diabetes mellitus ([place])   . Type 2 diabetes mellitus with stage 3 chronic kidney disease, with long-term current use of insulin ([place])    on 10/23/2016 Scr was 1.89, GFR 26 and K+ 4.2   Past Surgical History:  Past Surgical History:  Procedure Laterality Date  . CATARACT EXTRACTION    .  CATARACT EXTRACTION W/ INTRAOCULAR LENS IMPLANT Left 01/19/2017   Procedure: PHACOEMULSIFICATION PC / IOL TOPICAL; Surgeon: Sonia Side Burden, MD; Location: [place]; Service: Ophthalmology; Laterality: Left;  . CHOLECYSTECTOMY    . COLONOSCOPY Left 09/22/2016   Procedure: COLONOSCOPY, FLEXIBLE, PROXIMAL TO SPLENIC FLEXURE; DIAGNOSTIC, W/WO COLLECTION SPECIMEN BY BRUSH OR [place]; Surgeon: Ned Card, MD; Location: Endo Procedures [facility]; Service: Gastroenterology  . COLONOSCOPY  11/16/2016   Procedure:  COLONOSCOPY, FLEXIBLE, PROXIMAL TO SPLENIC FLEXURE; DIAGNOSTIC, W/WO COLLECTION SPECIMEN BY BRUSH OR [place]; Surgeon: Ned Card, MD; Location: Endo Procedures [facility]; Service: Gastroenterology  . COLONOSCOPY W/ BIOPSIES  10/07/2016   Procedure: COLONOSCOPY, FLEXIBLE, PROXIMAL TO SPLENIC FLEXURE; WITH BIOPSY, SINGLE OR MULTIPLE; Surgeon: Bing Quarry, MD; Location: Endo Procedures [facility]; Service: Gastroenterology  . CORONARY STENT PLACEMENT  2002  . GALLBLADDER SURGERY  2008  . LOOP RECORDER IMPLANT  2018   medtronic reveal linq  . NEPHRECTOMY Left 2005   renal cancer   . THYROIDECTOMY, PARTIAL  1978   x2  . TOTAL ABDOMINAL HYSTERECTOMY W/ BILATERAL SALPINGOOPHORECTOMY  1984  . TUBAL LIGATION  1980  . UPPER GASTROINTESTINAL ENDOSCOPY Left 09/22/2016   Procedure: UGI ENDO, INCLUDE ESOPHAGUS, STOMACH, & DUODENUM &/OR JEJUNUM; DX W/WO COLLECTION SPECIMN, BY BRUSH OR [place]; Surgeon: Ned Card, MD; Location: Endo Procedures [employer]; Service: Gastroenterology   HPI:  Caroline Blake a 72 y.o.femalewithhistory of recent stroke status post TPA, paroxysmal atrial fibrillation, history of GI bleed secondary to AVMs, hypertension, hyperlipidemia was brought to the ER after patient's family found that patient was getting increasingly worsening a aphasia from baseline with increasing weakness . Patient did not have any other new weakness of the extremities. Patient has had recently fracture of the right lower extremity. MRI does not show any new infarct or bleed. Swallow on prior assessment/admission was WNL.    Assessment / Plan / Recommendation Clinical Impression  Pt demonstrates mild to moderate deficits with word finding and accuracy at phrase/conversation level expressive language including semantic and phonemic paraphasias.  Pt can correct if the error is recognized with assist.  Receptive language with simple phrases and commands is adequate, but added complexity decreases  accuracy; pt unable to follow normal conversation between mulitple speakers. Function is consistent with documented baseline. Recommend pt continue home therapy program after d/c. No acute interventions needed.     SLP Assessment  SLP Recommendation/Assessment: All further Speech Lanaguage Pathology  needs can be addressed in the next venue of care SLP Visit Diagnosis: Aphasia (R47.01)    Follow Up Recommendations  Home health SLP    Frequency and Duration           SLP Evaluation Cognition  Overall Cognitive Status: History of cognitive impairments - at baseline Arousal/Alertness: Awake/alert Orientation Level: Oriented to place;Oriented to time;Oriented to situation;Oriented to person Attention: Sustained Sustained Attention: Appears intact Memory: Appears intact Awareness: Appears intact Problem Solving: Impaired Problem Solving Impairment: Verbal complex;Verbal basic       Comprehension  Auditory Comprehension Overall Auditory Comprehension: Impaired Yes/No Questions: Impaired Basic Biographical Questions: 76-100% accurate Basic Immediate Environment Questions: 50-74% accurate Complex Questions: 25-49% accurate Commands: Within Functional Limits One Step Basic Commands: 75-100% accurate Conversation: Simple    Expression Verbal Expression Overall Verbal Expression: Impaired Initiation: No impairment Automatic Speech: Name;Social Response;Month of year Level of Generative/Spontaneous Verbalization: Phrase Repetition: Impaired Level of Impairment: Sentence level Naming: Impairment Responsive: Not tested Confrontation: Within functional limits Convergent: Not tested Divergent: Not tested Other Naming  Comments: impaired in conversation Verbal Errors: Semantic paraphasias;Phonemic paraphasias;Not aware of errors Pragmatics: No impairment Effective Techniques: Semantic cues   Oral / Motor  Oral Motor/Sensory Function Overall Oral Motor/Sensory Function: Within  functional limits Motor Speech Overall Motor Speech: Impaired Respiration: Within functional limits Phonation: Normal Resonance: Hyponasality Articulation: Impaired Level of Impairment: Phrase Intelligibility: Intelligible Word: 75-100% accurate Phrase: 75-100% accurate Sentence: 75-100% accurate Motor Planning: Witnin functional limits Motor Speech Errors: Aware;Consistent   GO                    Brick Ketcher, Katherene Ponto 09/15/2017, 10:22 AM

## 2017-09-15 NOTE — Progress Notes (Signed)
CSW met with patient and husband, Shanon Brow, at the bedside per request to answer questions. Shanon Brow said that the patient doesn't want to go back to [facility] and he wanted to talk about how he could get her home. Shanon Brow works from 7 to 12 Monday through Thursday, so other than that time, he can take care of her at home, but he would need help from 8 to 12 in the mornings that he works. CSW informed Shanon Brow that Medicare will not pay for caregiving, that would be private pay. Shanon Brow was frustrated, and said he didn't have the money to provide for that.   Shanon Brow said he'd do some research on options he has, and would follow back up with CSW later. Shanon Brow also requested being able to talk with the palliative nurse again, that he has some concerns about what care the patient will require moving forward. CSW alerted MD to consult PMT.  CSW will continue to follow.  Laveda Abbe, [place] Clinical Social Worker [phone]

## 2017-09-16 DIAGNOSIS — I10 Essential (primary) hypertension: Secondary | ICD-10-CM

## 2017-09-16 DIAGNOSIS — Q2733 Arteriovenous malformation of digestive system vessel: Secondary | ICD-10-CM

## 2017-09-16 DIAGNOSIS — E1122 Type 2 diabetes mellitus with diabetic chronic kidney disease: Secondary | ICD-10-CM

## 2017-09-16 DIAGNOSIS — Z515 Encounter for palliative care: Secondary | ICD-10-CM

## 2017-09-16 DIAGNOSIS — I480 Paroxysmal atrial fibrillation: Secondary | ICD-10-CM

## 2017-09-16 DIAGNOSIS — N183 Chronic kidney disease, stage 3 (moderate): Secondary | ICD-10-CM

## 2017-09-16 LAB — RENAL FUNCTION PANEL
ANION GAP: 19 — AB (ref 5–15)
Albumin: 2 g/dL — ABNORMAL LOW (ref 3.5–5.0)
BUN: 126 mg/dL — ABNORMAL HIGH (ref 6–20)
CALCIUM: 5.6 mg/dL — AB (ref 8.9–10.3)
CHLORIDE: 109 mmol/L (ref 101–111)
CO2: 14 mmol/L — AB (ref 22–32)
Creatinine, Ser: 4.75 mg/dL — ABNORMAL HIGH (ref 0.44–1.00)
GFR calc non Af Amer: 8 mL/min — ABNORMAL LOW (ref >60)
GFR, EST AFRICAN AMERICAN: 10 mL/min — AB (ref >60)
Glucose, Bld: 102 mg/dL — ABNORMAL HIGH (ref 65–99)
POTASSIUM: 3.6 mmol/L (ref 3.5–5.1)
Phosphorus: 7.9 mg/dL — ABNORMAL HIGH (ref 2.5–4.6)
SODIUM: 142 mmol/L (ref 135–145)

## 2017-09-16 LAB — GLUCOSE, CAPILLARY
GLUCOSE-CAPILLARY: 99 mg/dL (ref 65–99)
GLUCOSE-CAPILLARY: 148 mg/dL — AB (ref 65–99)
Glucose-Capillary: 101 mg/dL — ABNORMAL HIGH (ref 65–99)
Glucose-Capillary: 102 mg/dL — ABNORMAL HIGH (ref 65–99)
Glucose-Capillary: 113 mg/dL — ABNORMAL HIGH (ref 65–99)
Glucose-Capillary: 113 mg/dL — ABNORMAL HIGH (ref 65–99)
Glucose-Capillary: 130 mg/dL — ABNORMAL HIGH (ref 65–99)

## 2017-09-16 LAB — CBC
HEMATOCRIT: 39.2 % (ref 36.0–46.0)
Hemoglobin: 12.5 g/dL (ref 12.0–15.0)
MCH: 25.2 pg — ABNORMAL LOW (ref 26.0–34.0)
MCHC: 31.9 g/dL (ref 30.0–36.0)
MCV: 79 fL (ref 78.0–100.0)
Platelets: 278 10*3/uL (ref 150–400)
RBC: 4.96 MIL/uL (ref 3.87–5.11)
RDW: 17.6 % — AB (ref 11.5–15.5)
WBC: 14.7 10*3/uL — AB (ref 4.0–10.5)

## 2017-09-16 LAB — PARATHYROID HORMONE, INTACT (NO CA): PTH: 73 pg/mL — AB (ref 15–65)

## 2017-09-16 LAB — CALCIUM, IONIZED

## 2017-09-16 MED ORDER — LEVOTHYROXINE SODIUM 75 MCG PO TABS
150.0000 ug | ORAL_TABLET | Freq: Every day | ORAL | Status: DC
  Administered 2017-09-17 – 2017-09-21 (×4): 150 ug via ORAL
  Filled 2017-09-16 (×6): qty 2

## 2017-09-16 MED ORDER — MAGNESIUM SULFATE 2 GM/50ML IV SOLN
2.0000 g | Freq: Once | INTRAVENOUS | Status: AC
  Administered 2017-09-16: 2 g via INTRAVENOUS
  Filled 2017-09-16: qty 50

## 2017-09-16 MED ORDER — CALCIUM CARBONATE ANTACID 500 MG PO CHEW: 200.0000 mg | CHEWABLE_TABLET | Freq: Three times a day (TID) | ORAL | Status: DC

## 2017-09-16 MED ORDER — ENSURE ENLIVE PO LIQD
237.0000 mL | Freq: Two times a day (BID) | ORAL | Status: DC
  Administered 2017-09-16 – 2017-09-21 (×10): 237 mL via ORAL

## 2017-09-16 MED ORDER — ADULT MULTIVITAMIN LIQUID CH
15.0000 mL | Freq: Every day | ORAL | Status: DC
  Administered 2017-09-16 – 2017-09-21 (×6): 15 mL via ORAL
  Filled 2017-09-16 (×6): qty 15

## 2017-09-16 MED ORDER — CALCITRIOL 0.25 MCG PO CAPS
0.2500 ug | ORAL_CAPSULE | Freq: Every day | ORAL | Status: DC
  Administered 2017-09-16 – 2017-09-21 (×6): 0.25 ug via ORAL
  Filled 2017-09-16 (×6): qty 1

## 2017-09-16 MED ORDER — CALCIUM CARBONATE ANTACID 500 MG PO CHEW
1.0000 | CHEWABLE_TABLET | Freq: Three times a day (TID) | ORAL | Status: DC
  Administered 2017-09-16 (×2): 200 mg via ORAL
  Filled 2017-09-16 (×3): qty 1

## 2017-09-16 MED ORDER — SODIUM CHLORIDE 0.9 % IV SOLN
2.0000 g | Freq: Once | INTRAVENOUS | Status: AC
  Administered 2017-09-16: 2 g via INTRAVENOUS
  Filled 2017-09-16: qty 20

## 2017-09-16 MED ORDER — SODIUM BICARBONATE 8.4 % IV SOLN
INTRAVENOUS | Status: DC
  Administered 2017-09-16 – 2017-09-18 (×2): via INTRAVENOUS
  Filled 2017-09-16 (×8): qty 1000

## 2017-09-16 MED ORDER — MAGNESIUM CHLORIDE 64 MG PO TBEC
2.0000 | DELAYED_RELEASE_TABLET | Freq: Two times a day (BID) | ORAL | Status: AC
  Administered 2017-09-16 – 2017-09-19 (×6): 128 mg via ORAL
  Filled 2017-09-16 (×7): qty 2

## 2017-09-16 NOTE — NC FL2 (Signed)
[place] MEDICAID FL2 LEVEL OF CARE SCREENING TOOL     IDENTIFICATION  Patient Name: Caroline Blake Birthdate: 05/27/1946 Sex: female Admission Date (Current Location): 09/14/2017  [facility] [place] Number:  Herbalist and Address:  [facility], [place] [address], [place], [place] [address]      Provider Number: [id number]  Attending Physician Name and Address:  Mariel Aloe, MD  Relative Name and Phone Number:       Current Level of Care: Hospital Recommended Level of Care: [place] Prior Approval Number:    Date Approved/Denied:   PASRR Number: [id number]  Discharge Plan: SNF    Current Diagnoses: Patient Active Problem List   Diagnosis Date Noted  . Aphasia 09/15/2017  . ARF (acute renal failure) ([place]) 09/14/2017  . Hypertension 09/10/2017  . Hyperlipidemia 09/10/2017  . Depression 09/10/2017  . 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 Overactive bladder 09/10/2017  . Paroxysmal atrial fibrillation (HCC)   . History of stroke   . History of seizure   . Vascular dementia with behavior disturbance   . Cerebral infarction ([place])   . Respiratory failure ([place])   . Goals of care, counseling/discussion   . Palliative care by specialist   . Stroke (cerebrum) ([place]) 09/01/2017  . AVM (arteriovenous malformation) of colon with hemorrhage 08/09/2016  . Diabetes mellitus ([place]) 08/09/1997    Orientation RESPIRATION BLADDER Height & Weight     Self  Normal Incontinent, External catheter Weight: 136 lb 6.4 oz (61.9 kg) Height:  5' 4.5" (163.8 cm)  BEHAVIORAL SYMPTOMS/MOOD NEUROLOGICAL BOWEL NUTRITION STATUS      Incontinent Diet(regular)  AMBULATORY STATUS COMMUNICATION OF NEEDS Skin   Extensive Assist Verbally Normal                       Personal Care Assistance Level of Assistance  Bathing, Feeding, Dressing Bathing Assistance: Maximum assistance Feeding assistance: Limited assistance Dressing  Assistance: Maximum assistance     Functional Limitations Info  Sight, Hearing, Speech Sight Info: Adequate Hearing Info: Adequate Speech Info: Adequate    SPECIAL CARE FACTORS FREQUENCY  PT (By licensed PT), OT (By licensed OT)     PT Frequency: 5x/wk OT Frequency: 5x/wk            Contractures Contractures Info: Not present    Additional Factors Info  Code Status, Allergies, Insulin Sliding Scale Code Status Info: DNR Allergies Info: Atorvastatin, Carvedilol, Cinoxacin, Etodolac, Metoclopramide, Moxifloxacin, Yellow Dye, Paroxetine Hcl, Rosuvastatin Calcium, Nitrofurantoin, Rivaroxaban, Sulfa Antibiotics, Yellow Dyes (Non-tartrazine), Aspirin-dipyridamole Er, Ezetimibe-simvastatin, Pitavastatin   Insulin Sliding Scale Info: 0-9 units every 4 hours       Current Medications (09/16/2017):  This is the current hospital active medication list Current Facility-Administered Medications  Medication Dose Route Frequency Provider Last Rate Last Dose  . acetaminophen (TYLENOL) tablet 650 mg  650 mg Oral Q4H PRN Rise Patience, MD       Or  . acetaminophen (TYLENOL) solution 650 mg  650 mg Per Tube Q4H PRN Rise Patience, MD       Or  . acetaminophen (TYLENOL) suppository 650 mg  650 mg Rectal Q4H PRN Rise Patience, MD      . aspirin suppository 300 mg  300 mg Rectal Daily Rise Patience, MD       Or  . aspirin tablet 325 mg  325 mg Oral Daily Rise Patience, MD   325 mg at  09/16/17 0909  . calcitRIOL (ROCALTROL) capsule 0.25 mcg  0.25 mcg Oral Daily Sela Hilding, MD   0.25 mcg at 09/16/17 1137  . calcium carbonate (TUMS - dosed in mg elemental calcium) chewable tablet 200 mg of elemental calcium  1 tablet Oral TID Sela Hilding, MD   200 mg of elemental calcium at 09/16/17 1136  . cefTRIAXone (ROCEPHIN) 1 g in dextrose 5 % 50 mL IVPB  1 g Intravenous Q24H Laren Everts, RPH 100 mL/hr at 09/15/17 2152 1 g at 09/15/17 2152  . dextrose 5 %  1,000 mL with sodium bicarbonate 150 mEq infusion   Intravenous Continuous Sela Hilding, MD 100 mL/hr at 09/16/17 1136    . enoxaparin (LOVENOX) injection 30 mg  30 mg Subcutaneous Q24H Rise Patience, MD   30 mg at 09/16/17 4627  . insulin aspart (novoLOG) injection 0-9 Units  0-9 Units Subcutaneous Q4H Rise Patience, MD   1 Units at 09/16/17 0350  . [START ON 09/17/2017] levothyroxine (SYNTHROID, LEVOTHROID) tablet 150 mcg  150 mcg Oral QAC breakfast Mariel Aloe, MD      . magnesium chloride (SLOW-MAG) 64 MG SR tablet 128 mg  2 tablet Oral BID Sela Hilding, MD   128 mg at 09/16/17 1137  . metoprolol tartrate (LOPRESSOR) injection 5 mg  5 mg Intravenous Q6H PRN Rise Patience, MD         Discharge Medications: Please see discharge summary for a list of discharge medications.  Relevant Imaging Results:  Relevant Lab Results:   Additional Information SS#: [id number]  Geralynn Ochs, LCSW

## 2017-09-16 NOTE — Progress Notes (Addendum)
Initial Nutrition Assessment  DOCUMENTATION CODES:   Not applicable  INTERVENTION:   Recommend check vitamin D labs   Ensure Enlive po BID, each supplement provides 350 kcal and 20 grams of protein  Magic cup TID with meals, each supplement provides 290 kcal and 9 grams of protein  MVI daily   NUTRITION DIAGNOSIS:   Inadequate oral intake related to acute illness as evidenced by meal completion < 50%.  GOAL:   Patient will meet greater than or equal to 90% of their needs  MONITOR:   PO intake, Supplement acceptance, Labs, Weight trends, I & O's, Skin  REASON FOR ASSESSMENT:   Consult Assessment of nutrition requirement/status  ASSESSMENT:   72 year old female presenting with worsening aphasia following L-MCA infarct s/p tPA on 09/01/17 with residual aphasia.  PMH significant for A. fib on Plavix, COPD, tobacco use disorder, HTN, AVM with history of GI bleed, T2DM, OSA.   Met with pt in room today. Pt is a poor historian but reports poor appetite and oral intake for several weeks pta. Pt reports that she does not drink supplements at home but she is willing to try them while here. Per chart, pt documented to be eating 50% of meals and approved for a regular consistency diet by SLP. Per chart, pt with 9lb(6%) wt loss in <3 weeks; this is significant given the time frame. RD will add supplements and MVI to help pt meet her estimated needs. Pt noted to have hypocalcemia and hyperphosphatemia; pt is being worked up for this. Per MD note, differentials include inadequate PTH production or secretion, PTH resistance, vitamin D deficiency or resistance, abnormal magnesium metabolism, or to extravascular deposition of calcium. Would recommend checking vitamin D labs.   Medications reviewed and include: aspirin, calcitriol, tums, lovenox, insulin, synthroid, Mg chloride, ceftriaxone   Labs reviewed: K 3.6 wnl, BUN 126(H), creat 4.75(H), Ca 5.6(L) adj. 7.2(L), P 7.9(H), alb 2.0(L) iCa  <3.0(L)- 2/7 Wbc- 14.7(H) iPTH- 73(H)- 2/7  Nutrition-Focused physical exam completed. Findings are no fat depletion, mild muscle depletions, and no edema. Pt noted to have wide spread bruising on bilateral arms and chest.   Diet Order:   Diet regular Room service appropriate? Yes; Fluid consistency: Thin  EDUCATION NEEDS:   Not appropriate for education at this time  Skin:  Reviewed RN Assessment  Last BM:  2/7-type 6  Height:   Ht Readings from Last 1 Encounters:  09/15/17 5' 4.5" (1.638 m)    Weight:   Wt Readings from Last 1 Encounters:  09/15/17 136 lb 6.4 oz (61.9 kg)    Ideal Body Weight:  55.6 kg  BMI:  Body mass index is 23.05 kg/m.  Estimated Nutritional Needs:   Kcal:  1500-1700kcal/day  Protein:  68-80g/day   Fluid:  >1.5L/day   Koleen Distance MS, RD, LDN Pager #[phone] After Hours Pager: [phone]

## 2017-09-16 NOTE — Progress Notes (Signed)
[facility] Progress Note    Assessment/ Plan:   Caroline Blake is a 72 y.o. female with PMH significant for recent stroke status post TPA, paroxysmal atrial fibrillation, history of GI bleed secondary to AVMs, hypertension, hypothyroidism, hyperlipidemia, R renal cancer s/p nephrectomy (10 years ago), urethral stricture, who presented with worsening aphasia and weakness.   1. AKI on CKD 3 (baseline in Care Everywhere 1.5-2): Worsened. UA collected in ED with likely UTI. Cultures in process with >100,000 GNRs. Differential includes prerenal (possible poor PO given deficits from CVA, ?sepsis 2/2 UTI with altered mentation), intrinsic (AIN 2/2 recent abx exposure during last admission). Renal US without obstruction, however includes hypoechoic nodule in R kidney.  -continue IV hydration, change to sodium bicarb given metabolic acidosis -consider foley if unable to obtain strict I/O -Daily RFPs 2. Hypocalcemia: has been present in [place] labs since 07/2016. Ionized calcium low at 3.9. Differential includes inadequate PTH production or secretion, PTH resistance, vitamin D deficiency or resistance, abnormal magnesium metabolism, or to extravascular deposition of calcium. PTH will aid in narrowing differential. Phosphorus elevated at 5.7 from 6.6. Chart states surgical hypothyroidism, curious about parathyroids (no surgical note in chart). Hypomagnesemic to 1.7.  -replete Mg 2g IV -getting calcium gluconate IV per primary -add tums 750mg  TID -add PO magnesium 400 BID  -add calcitriol 0.20mcg daily -PTH pending -consider checking Vit D  -await labs this am 3. Renal nodule: will likely need outpatient urology follow up, question recurrence of RCC. This plan will hinge on palliative consult as well.  4. Anemia: hx of multiple GI bleeds.  5.  Recent CVA per primary and neurology.   Subjective:   Patient disoriented. Demanding her nurse. States her thyroid surgery was in  "[place]" when asked.    Objective:   BP (!) 144/97 (BP Location: Right Arm)   Pulse 88   Temp 98.8 F (37.1 C) (Oral)   Resp 18   Ht 5' 4.5" (1.638 m)   Wt 136 lb 6.4 oz (61.9 kg)   SpO2 96%   BMI 23.05 kg/m   Intake/Output Summary (Last 24 hours) at 09/16/2017 7253 Last data filed at 09/16/2017 0900 Gross per 24 hour  Intake 1165 ml  Output 0 ml  Net 1165 ml   Weight change: -11.4 oz (-1.229 kg)  Physical Exam: Gen: Elderly female appears malnourished, lying in bed in NAD.  CVS: RRR, no murmur Resp: coarse breath sounds throughout, easy WOB Abd: SNTND, +BS  Ext: no edema, cast on RLE.   Imaging: Dg Chest 2 View  Result Date: 09/14/2017 CLINICAL DATA:  Increased slurred speech and altered mental status. EXAM: CHEST  2 VIEW COMPARISON:  09/03/2017. FINDINGS: Normal heart size. Loop recorder. Clear lung fields. Thoracic atherosclerosis. No osseous findings. IMPRESSION: No active cardiopulmonary disease.  Stable exam. Electronically Signed   By: Staci Righter M.D.   On: 09/14/2017 20:22   Ct Head Wo Contrast  Result Date: 09/14/2017 CLINICAL DATA:  Increased slurred speech and altered mental status EXAM: CT HEAD WITHOUT CONTRAST TECHNIQUE: Contiguous axial images were obtained from the base of the skull through the vertex without intravenous contrast. COMPARISON:  09/04/2017, 09/02/2017, 09/01/2017, 10/22/2016 FINDINGS: Brain: Multifocal old infarcts involving the right temporal lobe, right parietal lobe, inferior left frontal lobe, and left sub insula and periventricular white matter. Hypodensity within the left posterior insula and medial posterior temporal lobe, corresponding to the infarct demonstrated on MRI 09/04/2016. Left cerebellar infarct again demonstrated. No hemorrhage. No mass.  No midline shift. Moderate small vessel ischemic changes of the white matter. Moderate atrophy. Vascular: No hyperdense vessels.  Carotid artery calcification. Skull: No fracture.  Hyperostosis.  Sinuses/Orbits: Mild mucosal thickening in the ethmoid sinuses. No acute orbital abnormality. Left lens extraction. Other: None IMPRESSION: 1. Left cerebellar, left posterior insula, and left posteromedial temporal lobe infarcts as noted on recent MRI 09/04/2017; negative for hemorrhage or significant mass effect. 2. Multifocal old bilateral infarcts, atrophy, and small vessel ischemic changes of the white matter. Electronically Signed   By: Donavan Foil M.D.   On: 09/14/2017 20:17   Mr Brain Wo Contrast  Result Date: 09/15/2017 CLINICAL DATA:  Increasing weakness and aphasia. EXAM: MRI HEAD WITHOUT CONTRAST TECHNIQUE: Multiplanar, multiecho pulse sequences of the brain and surrounding structures were obtained without intravenous contrast. COMPARISON:  CT head 09/14/2017. MRI 09/04/2017. FINDINGS: Brain: Involuting restricted diffusion of the previously identified infarcts in the LEFT cerebellum, RIGHT cerebellum, LEFT insula, LEFT posterior frontoparietal cortex and white matter, and LEFT occipital cortex. The LEFT cerebellar area of cortical restricted diffusion is slightly better seen on today's study than priors, but this could be due to slice selection; no change in distribution. No new areas of acute infarction are seen. Chronic bihemispheric infarcts are stable. Atrophy and small vessel disease. Vascular: Flow voids are maintained throughout the carotid, basilar, and vertebral arteries. Tiny chronic hemorrhage RIGHT thalamus. Slight chronic hemorrhage lines the bed of the old RIGHT hemisphere infarct. Skull and upper cervical spine: Normal marrow signal. Sinuses/Orbits: Negative. Other: None. IMPRESSION: Involuting areas of restricted diffusion consistent with typical evolutionary change for acute into subacute infarction. No hemorrhagic transformation. No new areas of ischemia. Electronically Signed   By: Staci Righter M.D.   On: 09/15/2017 08:20   US Renal  Result Date: 09/15/2017 CLINICAL DATA:   Post left nephrectomy for RCC. Diabetes. Acute renal insufficiency. EXAM: RENAL / URINARY TRACT ULTRASOUND COMPLETE COMPARISON:  Body CT 08/14/2017 FINDINGS: Right Kidney: Length: 11.2 cm. Mildly increased cortical echogenicity. There is a benign-appearing 1.2 cm cyst, exophytic off of the lower pole of the left kidney. There is a hypoechoic indeterminate nodule in the midpole region of the right kidney which measures 1.8 x 1.6 x 2.1 cm. There is a benign-appearing cyst in the lateral mid polar cortex of the right kidney measuring 1.8 cm Left Kidney: Surgically absent. Bladder: Appears normal for degree of bladder distention. IMPRESSION: Mildly increased cortical echogenicity of the right kidney, likely due to medical renal disease. 1.8 cm indeterminate hypoechoic nodule in the midpole region of the right kidney. CT or MRI with contrast, renal protocol, may be considered when clinically feasible, if further imaging evaluation is desired. Electronically Signed   By: Fidela Salisbury M.D.   On: 09/15/2017 15:58    Labs: BMET Recent Labs  Lab 09/14/17 1925 09/14/17 1932 09/15/17 0806  NA 140 139 141  K 4.0 4.0 3.4*  CL 105 105 107  CO2 16*  --  11*  GLUCOSE 137* 134* 124*  BUN 101* 87* 121*  CREATININE 3.77* 3.90* 4.55*  CALCIUM 5.8*  --  5.6*   CBC Recent Labs  Lab 09/14/17 1925 09/14/17 1932 09/15/17 0806  WBC 15.0*  --  14.3*  NEUTROABS 12.8*  --   --   HGB 13.8 15.0 13.8  HCT 43.1 44.0 42.4  MCV 79.8  --  79.4  PLT 292  --  294    Medications:    . aspirin  300 mg Rectal Daily  Or  . aspirin  325 mg Oral Daily  . enoxaparin (LOVENOX) injection  30 mg Subcutaneous Q24H  . insulin aspart  0-9 Units Subcutaneous Q4H  . levothyroxine  75 mcg Intravenous Daily    Ralene Ok, MD 09/16/17

## 2017-09-16 NOTE — Progress Notes (Signed)
Critical lab received, calcium 5.6.  MD made aware, no new orders at this time.  Will continue to monitor.  Cori Razor, RN

## 2017-09-16 NOTE — Progress Notes (Signed)
CM spoke to patient and her son about the plans at d/c. Son states they would like patient to go to rehab at d/c and then they will plan on having her return home. Spouse has checked her out of [facility]. They are agreeable to being faxed out in the [facility] area but prefer to be close to [employer]. CSW updated.

## 2017-09-16 NOTE — Consult Note (Signed)
Consultation Note Date: 09/16/2017   Patient Name: Caroline Blake  DOB: 1946/04/20  MRN: [id number]  Age / Sex: 72 y.o., female  PCP: Drake Leach, MD Referring Physician: Mariel Aloe, MD  Reason for Consultation: Establishing goals of care and Psychosocial/spiritual support  HPI/Patient Profile: 72 y.o. female  with past medical history of CVA, dementia, atrial fib, colonic AVM with history of GI bleed, aphasia, diabetes, admitted on 09/14/2017 with increased weakness, aphasia.  Patient was admitted from skilled nursing facility.  Patient was recently hospitalized at the end of January 2019, seen by palliative medicine provider at that time when she was status post stroke.  Patient somewhat rallied during that admission and she was discharged to [facility] skilled nursing facility for rehab.  Ordered for goals of care as well as at family request.   Clinical Assessment and Goals of Care: Met with husband, as per his request.  I saw patient during her last hospitalization at the end of January after she had had another stroke.  At that time I met with her husband, her son Jonni Sanger, and daughter Junie Panning.  Patient had set limits on care of DNR/DNI.  She now is facing acute on chronic kidney disease with a creatinine of 4.75 (baseline creatinine is 1.5-2).  She also has hypocalcemia which she has had since 2017.  The speech and language has seen patient no evidence of dysphasia but she herself is stating she has no appetite . Husband states she is lost "30 pounds".  Husband, Jamica Woodyard [phone]  Husband reports that he is going to be taking her home from the skilled nursing facility.  He reports that he will need equipment as well as "I have to build a ramp up to the house".  Introduced the discussion regarding worsening renal function and the possibility of down the road dialysis.  Patient herself states that she  has never thought about this.  Her husband states "I am her healthcare power of attorney and will going to do what we have to do".    SUMMARY OF RECOMMENDATIONS   Confirmed DNR/DNI Family's questions centered mostly on case management/social work support.  Put case management consult in requesting equipment etc. Code Status/Advance Care Planning:  DNR   Palliative Prophylaxis:   Aspiration, Bowel Regimen, Eye Care, Frequent Pain Assessment, Oral Care and Turn Reposition  Additional Recommendations (Limitations, Scope, Preferences):  Full Scope Treatment except for DNR/DNI  Psycho-social/Spiritual:   Desire for further Chaplaincy support:no  Additional Recommendations: Referral to Community Resources   Prognosis:   Unable to determine  Discharge Planning: Home with Home Health      Primary Diagnoses: Present on Admission: . ARF (acute renal failure) ([place]) . Vascular dementia with behavior disturbance . Stroke (cerebrum) ([place]) . Paroxysmal atrial fibrillation ([facility]) . Hypertension   I have reviewed the medical record, interviewed the patient and family, and examined the patient. The following aspects are pertinent.  Past Medical History:  Diagnosis Date  . Anemia   . Arthritis of ankle  or foot, degenerative 04/30/2016  . Atrial fibrillation ([place]) 09/2016   with left atrial appendage, failed xarelto due to GIB, unable to have watchman due to nickel allergy and Coumadin never started  . AVM (arteriovenous malformation) of colon with hemorrhage 2018   requiring transfusion --- last one was March 2018 - HgB on 10/22/2016 was 7.3 and on 10/23/2016 was 8.7  . Bacteriuria with pyuria 08/14/2015  . Benign hypertension 1995  . Cancer of kidney ([place])   . Cholelithiasis 2008  . Chronic conjunctivitis 07/04/2012  . Chronic kidney disease   . Clostridium difficile colitis 2017  . Cognitive impairment   . Colon polyp 2005   and 11/16/2016  . COPD (chronic obstructive  pulmonary disease) ([place])    and currenlty smokes--- refuses to use HS home O2  . Coronary artery disease 2002  . CVA (cerebral vascular accident) ([place])   . Cystitis    INTESTINAL CHRONIC  . DDD (degenerative disc disease), cervical   . Depression   . Diabetes mellitus ([place]) 1999   HGBA1C on 09/09/2016 was 6.8  . Disease of thyroid gland   . History of kidney cancer    LEFT  . History of pancreatitis   . History of transfusion 10/2016  . Hyperlipidemia   . Hypertension   . Hypothyroidism   . Iron deficiency anemia 09/27/2016  . Left homonymous hemianopsia 07/04/2012  . Lisfranc dislocation, right, initial encounter 08/14/2017  . Memory impairment 09/09/2016  . Nuclear sclerosis of left eye 12/09/2016   added automatically from request for surgery [phone]  . OSA (obstructive sleep apnea)    o2 3L [place] PRN  . Pancreatitis 2008  . Posterior vitreous detachment 07/04/2012  . Postsurgical hypothyroidism    S/P partial thyroidectomy 1978, later went for additional parital thyroidectomy  . Renal cell adenocarcinoma, left ([place]) 2005   [place]  . Retinal drusen 07/04/2012  . Shortness of breath   . Sinus tarsitis of left foot 09/15/2016  . Snores 2005   does not use CPAP regularly  . Stroke [facility]) 2011   LLE sensory, 2012 x2 and 2013 x1; [place]  . TIA (transient ischemic attack) 10/21/2016  . Type 2 diabetes mellitus ([place])   . Type 2 diabetes mellitus with stage 3 chronic kidney disease, with long-term current use of insulin ([place])    on 10/23/2016 Scr was 1.89, GFR 26 and K+ 4.2   Social History   Socioeconomic History  . Marital status: Married    Spouse name: None  . Number of children: None  . Years of education: None  . Highest education level: None  Social Needs  . Financial resource strain: None  . Food insecurity - worry: None  . Food insecurity - inability: None  . Transportation needs - medical: None  . Transportation needs - non-medical: None  Occupational History  .  None  Tobacco Use  . Smoking status: Former Smoker    Packs/day: 1.00    Years: 50.00    Pack years: 50.00    Types: Cigarettes    Last attempt to quit: 10/21/2016    Years since quitting: 0.9  . Smokeless tobacco: Never Used  Substance and Sexual Activity  . Alcohol use: No    Frequency: Never  . Drug use: None  . Sexual activity: No  Other Topics Concern  . None  Social History Narrative  . None   Family History  Problem Relation Age of Onset  . Hyperlipidemia Mother   . Lung  cancer Mother   . Aneurysm Mother        abdominal aortic  . Anuerysm Father   . Heart disease Father   . Hyperlipidemia Father   . Stroke Father   . Tremor Father        familial essential  . Hypertension Sister   . Cancer Daughter        Peritoneal carcinomatosis, surviving  . Ovarian cancer Daughter   . Aneurysm Brother        abdominal aortic  . Anesthesia problems Neg Hx   . Arthritis Neg Hx   . Asthma Neg Hx   . Cerebral palsy Neg Hx   . Clotting disorder Neg Hx   . Deep vein thrombosis Neg Hx   . Club foot Neg Hx   . Collagen disease Neg Hx   . Diabetes Neg Hx   . Gait disorder Neg Hx   . Gout Neg Hx   . Hip dysplasia Neg Hx   . Hip fracture Neg Hx   . Hypermobility Neg Hx   . Osteoporosis Neg Hx   . Pulmonary embolism Neg Hx   . Scoliosis Neg Hx   . Spina bifida Neg Hx   . Thyroid disease Neg Hx   . Vasculitis Neg Hx   . Rheumatologic disease Neg Hx    Scheduled Meds: . aspirin  300 mg Rectal Daily   Or  . aspirin  325 mg Oral Daily  . calcitRIOL  0.25 mcg Oral Daily  . calcium carbonate  1 tablet Oral TID  . enoxaparin (LOVENOX) injection  30 mg Subcutaneous Q24H  . insulin aspart  0-9 Units Subcutaneous Q4H  . [START ON 09/17/2017] levothyroxine  150 mcg Oral QAC breakfast  . magnesium chloride  2 tablet Oral BID   Continuous Infusions: . cefTRIAXone (ROCEPHIN)  IV 1 g (09/15/17 2152)  . dextrose 5 % 1,000 mL with sodium bicarbonate 150 mEq infusion 100 mL/hr at  09/16/17 1136  . magnesium sulfate 1 - 4 g bolus IVPB 2 g (09/16/17 1259)   PRN Meds:.acetaminophen **OR** acetaminophen (TYLENOL) oral liquid 160 mg/5 mL **OR** acetaminophen, metoprolol tartrate Medications Prior to Admission:  Prior to Admission medications   Medication Sig Start Date End Date Taking? Authorizing Provider  buPROPion (WELLBUTRIN SR) 150 MG 12 hr tablet Take 150 mg by mouth every morning. 05/28/17  Yes [provider]  clopidogrel (PLAVIX) 75 MG tablet Take 75 mg by mouth daily. 05/27/17  Yes [provider]  conjugated estrogens (PREMARIN) vaginal cream Place 1 g vaginally daily as needed for itching. 09/06/14  Yes [provider]  donepezil (ARICEPT) 10 MG tablet Take 5 mg by mouth daily. 05/27/17  Yes [provider]  ezetimibe (ZETIA) 10 MG tablet Take 10 mg by mouth daily. 04/02/13  Yes [provider]  insulin aspart protamine- aspart (NOVOLOG MIX 70/30) (70-30) 100 UNIT/ML injection Inject 3 Units into the skin 2 (two) times daily.   Yes [provider]  lamoTRIgine (LAMICTAL) 25 MG tablet Take 2 tablets (50 mg total) by mouth 2 (two) times daily. 09/10/17  Yes Costello, Kayren Eaves, NP  levothyroxine (SYNTHROID, LEVOTHROID) 150 MCG tablet Take 150 mcg by mouth daily. 08/12/17  Yes [provider]  metoprolol tartrate (LOPRESSOR) 50 MG tablet Take 50 mg by mouth daily. 03/12/13  Yes [provider]  metroNIDAZOLE (METROGEL) 0.75 % vaginal gel Place 1 Applicatorful vaginally daily as needed for itching. 07/29/17  Yes [provider]  mirabegron ER (MYRBETRIQ) 50 MG TB24 tablet Take 25 mg by mouth daily.   Yes [provider]  Omega-3 Fatty Acids (FISH OIL PO) Take 1 capsule by mouth daily.   Yes [provider]  phenazopyridine (PYRIDIUM) 200 MG tablet Take 200 mg by mouth daily as needed. UTI 08/24/17  Yes [provider]  pravastatin (PRAVACHOL) 80 MG tablet Take 80 mg by mouth  daily. 05/27/17  Yes [provider]  promethazine (PHENERGAN) 25 MG tablet Take 25 mg by mouth every 6 (six) hours as needed for nausea or vomiting.   Yes [provider]  sertraline (ZOLOFT) 100 MG tablet Take 200 mg by mouth daily. 08/09/17  Yes [provider]  traMADol (ULTRAM) 50 MG tablet Take 50 mg by mouth 3 (three) times daily as needed for pain. 02/25/17  Yes [provider]  lamoTRIgine (LAMICTAL) 100 MG tablet Take 1 tablet (100 mg total) by mouth 2 (two) times daily. 09/17/17   Mary Sella, NP   Allergies  Allergen Reactions  . Atorvastatin Itching and Other (See Comments)    Myopathy also   . Carvedilol Itching  . Cinoxacin Itching and Other (See Comments)    Hypotension also  . Etodolac Other (See Comments), Itching and Hives  . Metoclopramide Itching  . Moxifloxacin Itching  . Yellow Dye Hives  . Paroxetine Hcl Other (See Comments)    Unknown reaction  . Rosuvastatin Calcium Other (See Comments)    Myalgias  . Nitrofurantoin Other (See Comments)    Yeast infection  . Rivaroxaban Other (See Comments)    GI bleed  . Sulfa Antibiotics Hives and Itching  . Yellow Dyes (Non-Tartrazine) Other (See Comments)    unspecified  . Aspirin-Dipyridamole Er Itching  . Ezetimibe-Simvastatin Itching  . Pitavastatin Itching   Review of Systems  Unable to perform ROS: Dementia    Physical Exam  Constitutional: She appears well-developed.  Frail, older female in no acute distress  HENT:  Head: Normocephalic and atraumatic.  Cardiovascular:  Atrial fib  Pulmonary/Chest: Effort normal.  Abdominal: Soft.  Neurological: She is alert.  Oriented to self, that she is in the hospital; short-term memory deficits noted as well as some expressive aphasia  Skin: Skin is warm and dry.  Psychiatric: Her behavior is normal.  Nursing note and vitals reviewed.   Vital Signs: BP 120/72 (BP Location: Right Arm)   Pulse 88   Temp 98.2 F (36.8 C)  (Oral)   Resp 18   Ht 5' 4.5" (1.638 m)   Wt 61.9 kg (136 lb 6.4 oz)   SpO2 99%   BMI 23.05 kg/m  Pain Assessment: No/denies pain   Pain Score: 0-No pain   SpO2: SpO2: 99 % O2 Device:SpO2: 99 % O2 Flow Rate: .O2 Flow Rate (L/min): 2 L/min  IO: Intake/output summary:   Intake/Output Summary (Last 24 hours) at 09/16/2017 1313 Last data filed at 09/16/2017 1200 Gross per 24 hour  Intake 600 ml  Output 0 ml  Net 600 ml    LBM: Last BM Date: 09/15/17 Baseline Weight: Weight: 63.1 kg (139 lb 1.8 oz) Most recent weight: Weight: 61.9 kg (136 lb 6.4 oz)     Palliative Assessment/Data:   Flowsheet Rows     Most Recent Value  Intake Tab  Referral Department  Hospitalist  Unit at Time of Referral  Med/Surg Unit  Palliative Care Primary Diagnosis  Neurology  Date Notified  09/15/17  Reason for referral  Clarify Goals of  Care  Date of Admission  09/14/17  Date first seen by Palliative Care  09/16/17  # of days Palliative referral response time  1 Day(s)  # of days IP prior to Palliative referral  1  Clinical Assessment  Palliative Performance Scale Score  50%  Pain Max last 24 hours  Not able to report  Pain Min Last 24 hours  Not able to report  Dyspnea Max Last 24 Hours  Not able to report  Dyspnea Min Last 24 hours  Not able to report  Nausea Max Last 24 Hours  Not able to report  Nausea Min Last 24 Hours  Not able to report  Anxiety Max Last 24 Hours  Not able to report  Anxiety Min Last 24 Hours  Not able to report  Other Max Last 24 Hours  Not able to report  Psychosocial & Spiritual Assessment  Palliative Care Outcomes  Patient/Family wishes: Interventions discontinued/not started   Mechanical Ventilation, Trach, PEG  Palliative Care follow-up planned  Yes, Facility      Time In: 1030 Time Out: 1130 Time Total: 60 min Greater than 50%  of this time was spent counseling and coordinating care related to the above assessment and plan.  Signed by: Dory Horn, NP   Please contact Palliative Medicine Team phone at [phone] for questions and concerns.  For individual provider: See Shea Evans

## 2017-09-16 NOTE — Progress Notes (Signed)
PROGRESS NOTE    Caroline Blake  BSW:[id number] DOB: 08/02/1946 DOA: 09/14/2017 PCP: Drake Leach, MD   Brief Narrative: Caroline Blake is a 72 y.o. female with history of recent stroke status post TPA, paroxysmal atrial fibrillation, history of GI bleed secondary to AVMs, hypertension, hyperlipidemia. She presented with increased weakness thought to be secondary to stroke, however, MRI was negative. Found to have AKI on CKD and UTI. Nephrology consulted and started on ceftriaxone for UTI.   Assessment & Plan:   Principal Problem:   ARF (acute renal failure) (HCC) Active Problems:   Stroke (cerebrum) (HCC)   Paroxysmal atrial fibrillation (HCC)   History of seizure   Vascular dementia with behavior disturbance   Hypertension   Diabetes mellitus ([place])   AVM (arteriovenous malformation) of colon with hemorrhage   Aphasia   Acute kidney injury on CKD III Anion gap metabolic acidosis Unknown etiology. Worsening creatinine. No urine output documented yesterday. -Nephrology recommendations -IV fluids  Hypocalcemia Possibly secondary to renal disease. This is chronic from at least early 2018 per chart review. No improvement with calcium gluconate yesterday. PTH elevated. -Calcium gluconate IV -Nephrology recommendations: Tums, calcitriol, magnesium, vitamin D  History of CVA Initial concern for acute CVA which was ruled out with MRI. -Continue aspirin  UTI Preliminary significant for gram negative rods -Continue ceftriaxone -culture pending  Hypothyroidism -Continue Synthroid  Paroxysmal atrial fibrillation Currently not on anticoagulation secondary to history of GI bleeding. Rate currently controlled.  History of diabetes mellitus Diet controlled. Last hemoglobin A1C of 6.5%. -Continue SSI  Renal nodule Seen on renal ultrasound. Would require imaging with contrast for further evaluation, which patient cannot tolerate at this time secondary to AKI. Patient with a  history of renal cell carcinoma.    DVT prophylaxis: Lovenox Code Status: DNR Family Communication: Husband on telephone Disposition Plan: When medically stable, likely back to SNF   Consultants:   Nephrology  Procedures:   None  Antimicrobials:  None    Subjective: Patient angry about bruising from IV attempts.  Objective: Vitals:   09/15/17 2000 09/16/17 0117 09/16/17 0552 09/16/17 0930  BP: (!) 111/59 124/68 (!) 144/97 120/72  Pulse: 74 81 88   Resp:  18 18 18   Temp: 98.3 F (36.8 C) 98.3 F (36.8 C) 98.8 F (37.1 C) 98.2 F (36.8 C)  TempSrc: Oral Oral Oral Oral  SpO2: 100% 96% 96% 99%  Weight:      Height:        Intake/Output Summary (Last 24 hours) at 09/16/2017 1105 Last data filed at 09/16/2017 0900 Gross per 24 hour  Intake 840 ml  Output 0 ml  Net 840 ml   Filed Weights   09/15/17 0131 09/15/17 1002  Weight: 63.1 kg (139 lb 1.8 oz) 61.9 kg (136 lb 6.4 oz)    Examination:  General exam: Appears calm and comfortable Respiratory system: Clear to auscultation. Respiratory effort normal. Cardiovascular system: S1 & S2 heard, RRR. No murmurs, rubs, gallops or clicks. Gastrointestinal system: Abdomen is nondistended, soft and nontender. No organomegaly or masses felt. Normal bowel sounds heard. Central nervous system: Alert and oriented. No focal neurological deficits. Extremities: No edema. No calf tenderness Skin: No cyanosis. Multiple areas of ecchymosis on bilateral forearms Psychiatry: Judgement and insight appear normal. Patient irritated.    Data Reviewed: I have personally reviewed following labs and imaging studies  CBC: Recent Labs  Lab 09/14/17 1925 09/14/17 1932 09/15/17 0806 09/16/17 0807  WBC 15.0*  --  14.3*  14.7*  NEUTROABS 12.8*  --   --   --   HGB 13.8 15.0 13.8 12.5  HCT 43.1 44.0 42.4 39.2  MCV 79.8  --  79.4 79.0  PLT 292  --  294 397   Basic Metabolic Panel: Recent Labs  Lab 09/14/17 1925 09/14/17 1932  09/15/17 0806 09/16/17 0731  NA 140 139 141 142  K 4.0 4.0 3.4* 3.6  CL 105 105 107 109  CO2 16*  --  11* 14*  GLUCOSE 137* 134* 124* 102*  BUN 101* 87* 121* 126*  CREATININE 3.77* 3.90* 4.55* 4.75*  CALCIUM 5.8*  --  5.6* 5.6*  PHOS  --   --   --  7.9*   GFR: Estimated Creatinine Clearance: 9.7 mL/min (A) (by C-G formula based on SCr of 4.75 mg/dL (H)). Liver Function Tests: Recent Labs  Lab 09/14/17 1925 09/15/17 0806 09/16/17 0731  AST 26 24  --   ALT 15 18  --   ALKPHOS 178* 173*  --   BILITOT 0.5 0.3  --   PROT 4.9* 4.7*  --   ALBUMIN 2.5* 2.3* 2.0*   No results for input(s): LIPASE, AMYLASE in the last 168 hours. No results for input(s): AMMONIA in the last 168 hours. Coagulation Profile: Recent Labs  Lab 09/14/17 1925  INR 1.25   Cardiac Enzymes: Recent Labs  Lab 09/15/17 0806 09/15/17 1306 09/15/17 2029  TROPONINI <0.03 <0.03 <0.03   BNP (last 3 results) No results for input(s): PROBNP in the last 8760 hours. HbA1C: No results for input(s): HGBA1C in the last 72 hours. CBG: Recent Labs  Lab 09/15/17 2058 09/15/17 2348 09/16/17 0438 09/16/17 0626 09/16/17 0747  GLUCAP 106* 139* 99 102* 101*   Lipid Profile: No results for input(s): CHOL, HDL, LDLCALC, TRIG, CHOLHDL, LDLDIRECT in the last 72 hours. Thyroid Function Tests: No results for input(s): TSH, T4TOTAL, FREET4, T3FREE, THYROIDAB in the last 72 hours. Anemia Panel: No results for input(s): VITAMINB12, FOLATE, FERRITIN, TIBC, IRON, RETICCTPCT in the last 72 hours. Sepsis Labs: Recent Labs  Lab 09/15/17 0806 09/15/17 1129  LATICACIDVEN 1.1 1.1    Recent Results (from the past 240 hour(s))  Urine culture     Status: Abnormal (Preliminary result)   Collection Time: 09/14/17  9:27 PM  Result Value Ref Range Status   Specimen Description URINE, RANDOM  Final   Special Requests   Final    NONE Performed at [facility], [place] [address] [place], [place] [address]    Culture  >=100,000 COLONIES/mL GRAM NEGATIVE RODS (A)  Final   Report Status PENDING  Incomplete         Radiology Studies: Dg Chest 2 View  Result Date: 09/14/2017 CLINICAL DATA:  Increased slurred speech and altered mental status. EXAM: CHEST  2 VIEW COMPARISON:  09/03/2017. FINDINGS: Normal heart size. Loop recorder. Clear lung fields. Thoracic atherosclerosis. No osseous findings. IMPRESSION: No active cardiopulmonary disease.  Stable exam. Electronically Signed   By: Staci Righter M.D.   On: 09/14/2017 20:22   Ct Head Wo Contrast  Result Date: 09/14/2017 CLINICAL DATA:  Increased slurred speech and altered mental status EXAM: CT HEAD WITHOUT CONTRAST TECHNIQUE: Contiguous axial images were obtained from the base of the skull through the vertex without intravenous contrast. COMPARISON:  09/04/2017, 09/02/2017, 09/01/2017, 10/22/2016 FINDINGS: Brain: Multifocal old infarcts involving the right temporal lobe, right parietal lobe, inferior left frontal lobe, and left sub insula and periventricular white matter. Hypodensity within the  left posterior insula and medial posterior temporal lobe, corresponding to the infarct demonstrated on MRI 09/04/2016. Left cerebellar infarct again demonstrated. No hemorrhage. No mass. No midline shift. Moderate small vessel ischemic changes of the white matter. Moderate atrophy. Vascular: No hyperdense vessels.  Carotid artery calcification. Skull: No fracture.  Hyperostosis. Sinuses/Orbits: Mild mucosal thickening in the ethmoid sinuses. No acute orbital abnormality. Left lens extraction. Other: None IMPRESSION: 1. Left cerebellar, left posterior insula, and left posteromedial temporal lobe infarcts as noted on recent MRI 09/04/2017; negative for hemorrhage or significant mass effect. 2. Multifocal old bilateral infarcts, atrophy, and small vessel ischemic changes of the white matter. Electronically Signed   By: Donavan Foil M.D.   On: 09/14/2017 20:17   Mr Brain Wo  Contrast  Result Date: 09/15/2017 CLINICAL DATA:  Increasing weakness and aphasia. EXAM: MRI HEAD WITHOUT CONTRAST TECHNIQUE: Multiplanar, multiecho pulse sequences of the brain and surrounding structures were obtained without intravenous contrast. COMPARISON:  CT head 09/14/2017. MRI 09/04/2017. FINDINGS: Brain: Involuting restricted diffusion of the previously identified infarcts in the LEFT cerebellum, RIGHT cerebellum, LEFT insula, LEFT posterior frontoparietal cortex and white matter, and LEFT occipital cortex. The LEFT cerebellar area of cortical restricted diffusion is slightly better seen on today's study than priors, but this could be due to slice selection; no change in distribution. No new areas of acute infarction are seen. Chronic bihemispheric infarcts are stable. Atrophy and small vessel disease. Vascular: Flow voids are maintained throughout the carotid, basilar, and vertebral arteries. Tiny chronic hemorrhage RIGHT thalamus. Slight chronic hemorrhage lines the bed of the old RIGHT hemisphere infarct. Skull and upper cervical spine: Normal marrow signal. Sinuses/Orbits: Negative. Other: None. IMPRESSION: Involuting areas of restricted diffusion consistent with typical evolutionary change for acute into subacute infarction. No hemorrhagic transformation. No new areas of ischemia. Electronically Signed   By: Staci Righter M.D.   On: 09/15/2017 08:20   US Renal  Result Date: 09/15/2017 CLINICAL DATA:  Post left nephrectomy for RCC. Diabetes. Acute renal insufficiency. EXAM: RENAL / URINARY TRACT ULTRASOUND COMPLETE COMPARISON:  Body CT 08/14/2017 FINDINGS: Right Kidney: Length: 11.2 cm. Mildly increased cortical echogenicity. There is a benign-appearing 1.2 cm cyst, exophytic off of the lower pole of the left kidney. There is a hypoechoic indeterminate nodule in the midpole region of the right kidney which measures 1.8 x 1.6 x 2.1 cm. There is a benign-appearing cyst in the lateral mid polar cortex  of the right kidney measuring 1.8 cm Left Kidney: Surgically absent. Bladder: Appears normal for degree of bladder distention. IMPRESSION: Mildly increased cortical echogenicity of the right kidney, likely due to medical renal disease. 1.8 cm indeterminate hypoechoic nodule in the midpole region of the right kidney. CT or MRI with contrast, renal protocol, may be considered when clinically feasible, if further imaging evaluation is desired. Electronically Signed   By: Fidela Salisbury M.D.   On: 09/15/2017 15:58        Scheduled Meds: . aspirin  300 mg Rectal Daily   Or  . aspirin  325 mg Oral Daily  . calcitRIOL  0.25 mcg Oral Daily  . calcium carbonate  1 tablet Oral TID  . enoxaparin (LOVENOX) injection  30 mg Subcutaneous Q24H  . insulin aspart  0-9 Units Subcutaneous Q4H  . levothyroxine  75 mcg Intravenous Daily  . magnesium chloride  2 tablet Oral BID   Continuous Infusions: . calcium gluconate    . cefTRIAXone (ROCEPHIN)  IV 1 g (09/15/17 2152)  . dextrose  5 % 1,000 mL with sodium bicarbonate 150 mEq infusion    . magnesium sulfate 1 - 4 g bolus IVPB       LOS: 1 day     Cordelia Poche, MD [facility] 09/16/2017, 11:05 AM Pager: [phone]  If 7PM-7AM, please contact night-coverage [email or web address] Password [email or web address] 09/16/2017, 11:05 AM

## 2017-09-17 LAB — GLUCOSE, CAPILLARY
Glucose-Capillary: 101 mg/dL — ABNORMAL HIGH (ref 65–99)
Glucose-Capillary: 101 mg/dL — ABNORMAL HIGH (ref 65–99)
Glucose-Capillary: 127 mg/dL — ABNORMAL HIGH (ref 65–99)
Glucose-Capillary: 113 mg/dL — ABNORMAL HIGH (ref 65–99)
Glucose-Capillary: 101 mg/dL — ABNORMAL HIGH (ref 65–99)

## 2017-09-17 LAB — URINE CULTURE

## 2017-09-17 LAB — RENAL FUNCTION PANEL
Albumin: 2 g/dL — ABNORMAL LOW (ref 3.5–5.0)
Anion gap: 17 — ABNORMAL HIGH (ref 5–15)
BUN: 119 mg/dL — AB (ref 6–20)
CHLORIDE: 106 mmol/L (ref 101–111)
CO2: 19 mmol/L — AB (ref 22–32)
CREATININE: 4.43 mg/dL — AB (ref 0.44–1.00)
Calcium: 6 mg/dL — CL (ref 8.9–10.3)
GFR calc Af Amer: 11 mL/min — ABNORMAL LOW (ref >60)
GFR, EST NON AFRICAN AMERICAN: 9 mL/min — AB (ref >60)
Glucose, Bld: 114 mg/dL — ABNORMAL HIGH (ref 65–99)
Phosphorus: 7.1 mg/dL — ABNORMAL HIGH (ref 2.5–4.6)
Potassium: 3.1 mmol/L — ABNORMAL LOW (ref 3.5–5.1)
Sodium: 142 mmol/L (ref 135–145)

## 2017-09-17 MED ORDER — WHITE PETROLATUM EX OINT
TOPICAL_OINTMENT | CUTANEOUS | Status: AC
  Administered 2017-09-17: 06:00:00
  Filled 2017-09-17: qty 28.35

## 2017-09-17 MED ORDER — ONDANSETRON HCL 4 MG/2ML IJ SOLN
4.0000 mg | Freq: Three times a day (TID) | INTRAMUSCULAR | Status: DC | PRN
  Administered 2017-09-17: 4 mg via INTRAVENOUS
  Filled 2017-09-17: qty 2

## 2017-09-17 MED ORDER — POTASSIUM CHLORIDE 20 MEQ PO PACK
20.0000 meq | PACK | Freq: Two times a day (BID) | ORAL | Status: DC
  Administered 2017-09-17: 20 meq via ORAL
  Filled 2017-09-17 (×2): qty 1

## 2017-09-17 MED ORDER — SODIUM CHLORIDE 0.9 % IV SOLN
2.0000 g | Freq: Once | INTRAVENOUS | Status: AC
  Administered 2017-09-17: 2 g via INTRAVENOUS
  Filled 2017-09-17: qty 20

## 2017-09-17 MED ORDER — POTASSIUM CHLORIDE CRYS ER 20 MEQ PO TBCR
40.0000 meq | EXTENDED_RELEASE_TABLET | Freq: Once | ORAL | Status: AC
  Administered 2017-09-17: 40 meq via ORAL
  Filled 2017-09-17: qty 2

## 2017-09-17 MED ORDER — CALCIUM CARBONATE ANTACID 500 MG PO CHEW
800.0000 mg | CHEWABLE_TABLET | Freq: Three times a day (TID) | ORAL | Status: DC
  Administered 2017-09-17 – 2017-09-18 (×3): 800 mg via ORAL
  Filled 2017-09-17 (×3): qty 4

## 2017-09-17 MED ORDER — POTASSIUM CHLORIDE CRYS ER 20 MEQ PO TBCR
20.0000 meq | EXTENDED_RELEASE_TABLET | Freq: Two times a day (BID) | ORAL | Status: DC
  Administered 2017-09-18 – 2017-09-20 (×6): 20 meq via ORAL
  Filled 2017-09-17 (×6): qty 1

## 2017-09-17 NOTE — Progress Notes (Signed)
[facility] Progress Note    Assessment/ Plan:   Caroline Blake is a 72 y.o. female with PMH significant for recent stroke status post TPA, paroxysmal atrial fibrillation, history of GI bleed secondary to AVMs, hypertension, hypothyroidism, hyperlipidemia, R renal cancer s/p nephrectomy (10 years ago), urethral stricture, who presented with worsening aphasia and weakness.   1. AKI on CKD 3 (baseline in Care Everywhere 1.5-2): Worsened. UA collected in ED with likely UTI. Cultures in process with >100,000 GNRs. Differential includes prerenal (possible poor PO given deficits from CVA, ?sepsis 2/2 UTI with altered mentation), intrinsic (AIN 2/2 recent abx exposure during last admission). Renal US without obstruction, however includes hypoechoic nodule in R kidney. Cr 4.43 from 4.75, BUN 119 from 126 -continue IV hydration, with sodium bicarb given metabolic acidosis -consider foley if unable to obtain strict I/O -Daily RFPs  2. Hypocalcemia: has been present in [place] labs since 07/2016. Ionized calcium low at 3.9. Differential includes inadequate PTH production or secretion, PTH resistance, vitamin D deficiency or resistance, abnormal magnesium metabolism, or to extravascular deposition of calcium. Calcium improving this morning at 6.0 >> Corrects to 8.4.  Phosphorus 7.1. Chart states surgical hypothyroidism, curious about parathyroids (no surgical note in chart).  -s/p repletion Mg 2g IV -getting calcium gluconate IV per primary - recommend tums 750mg  TID, PO magnesium 400 BID, calcitriol 0.42mcg daily -PTH elevated at 73 -consider checking Vit D    3. Renal nodule: will likely need outpatient urology follow up, question recurrence of RCC. This plan will hinge on palliative consult as well.  4. Anemia: hx of multiple GI bleeds.  5.  Recent CVA per primary and neurology.   Subjective:   Patient is confused at baseline, she is denying any generalized pain. Her son at  bedside reports she has had some diarrhea   Objective:   BP (!) 146/76 (BP Location: Left Arm)   Pulse 84   Temp (!) 97.5 F (36.4 C) (Oral)   Resp 20   Ht 5' 4.5" (1.638 m)   Wt 136 lb 6.4 oz (61.9 kg)   SpO2 96%   BMI 23.05 kg/m   Intake/Output Summary (Last 24 hours) at 09/17/2017 1009 Last data filed at 09/17/2017 2694 Gross per 24 hour  Intake 960 ml  Output 300 ml  Net 660 ml   Weight change:   Physical Exam:  Gen: Elderly female, NAD CVS: RRR, no murmur/rub/gallop Resp: coarse breath sounds throughout, comfortable WOB Abd: soft, NT/ND +BS  Ext: no edema, cast on RLE.   Imaging: US Renal  Result Date: 09/15/2017 CLINICAL DATA:  Post left nephrectomy for RCC. Diabetes. Acute renal insufficiency. EXAM: RENAL / URINARY TRACT ULTRASOUND COMPLETE COMPARISON:  Body CT 08/14/2017 FINDINGS: Right Kidney: Length: 11.2 cm. Mildly increased cortical echogenicity. There is a benign-appearing 1.2 cm cyst, exophytic off of the lower pole of the left kidney. There is a hypoechoic indeterminate nodule in the midpole region of the right kidney which measures 1.8 x 1.6 x 2.1 cm. There is a benign-appearing cyst in the lateral mid polar cortex of the right kidney measuring 1.8 cm Left Kidney: Surgically absent. Bladder: Appears normal for degree of bladder distention. IMPRESSION: Mildly increased cortical echogenicity of the right kidney, likely due to medical renal disease. 1.8 cm indeterminate hypoechoic nodule in the midpole region of the right kidney. CT or MRI with contrast, renal protocol, may be considered when clinically feasible, if further imaging evaluation is desired. Electronically Signed   By:  Fidela Salisbury M.D.   On: 09/15/2017 15:58    Labs: BMET Recent Labs  Lab 09/14/17 1925 09/14/17 1932 09/15/17 0806 09/16/17 0731 09/17/17 0243  NA 140 139 141 142 142  K 4.0 4.0 3.4* 3.6 3.1*  CL 105 105 107 109 106  CO2 16*  --  11* 14* 19*  GLUCOSE 137* 134* 124* 102* 114*   BUN 101* 87* 121* 126* 119*  CREATININE 3.77* 3.90* 4.55* 4.75* 4.43*  CALCIUM 5.8*  --  5.6* 5.6* 6.0*  PHOS  --   --   --  7.9* 7.1*   CBC Recent Labs  Lab 09/14/17 1925 09/14/17 1932 09/15/17 0806 09/16/17 0807  WBC 15.0*  --  14.3* 14.7*  NEUTROABS 12.8*  --   --   --   HGB 13.8 15.0 13.8 12.5  HCT 43.1 44.0 42.4 39.2  MCV 79.8  --  79.4 79.0  PLT 292  --  294 278    Medications:    . aspirin  300 mg Rectal Daily   Or  . aspirin  325 mg Oral Daily  . calcitRIOL  0.25 mcg Oral Daily  . calcium carbonate  800 mg of elemental calcium Oral TID WC  . enoxaparin (LOVENOX) injection  30 mg Subcutaneous Q24H  . feeding supplement (ENSURE ENLIVE)  237 mL Oral BID BM  . insulin aspart  0-9 Units Subcutaneous Q4H  . levothyroxine  150 mcg Oral QAC breakfast  . magnesium chloride  2 tablet Oral BID  . multivitamin  15 mL Oral Daily  . potassium chloride  20 mEq Oral BID    Everrett Coombe, MD 09/17/17

## 2017-09-17 NOTE — Evaluation (Signed)
Occupational Therapy Evaluation Patient Details Name: Caroline Blake MRN: [id number] DOB: [birth year] Today's Date: 09/17/2017    History of Present Illness Pt is a 72 y/o female with PMH of recent stroke status post TPA, paroxysmal atrial fibrillation, history of GI bleed secondary to AVMs, hypertension, hyperlipidemia was brought to the ER after patient's family found that patient was getting increasingly worsening aphasia from baseline with increasing weakness. Pt admitted from SNF. Of note, pt recently admitted to [facility] in January 2019 with a CVA, multiple R foot fxs (NWB R LE) and R clavicular fx (NWB R UE).   Clinical Impression   Pt admitted with the above diagnoses and presents with below problem list. Pt will benefit from continued acute OT to address the below listed deficits and maximize independence with basic ADLs prior to d/c to next venue. PTA pt was doing rehab in SNF setting and needed assist with ADLs and transfers. Pt currently max A for bed level to EOB level ADLs. +2 assist for bed mobility. Son present towards end of session and included in discussions. Son verbalized plan for pt to return to SNF setting for rehab with eventual goal of pt returning home with family assisting with care.       Follow Up Recommendations  SNF;Supervision/Assistance - 24 hour    Equipment Recommendations  Other (comment)(defer to next venue)    Recommendations for Other Services       Precautions / Restrictions Precautions Precautions: Fall Required Braces or Orthoses: Sling(sling not in room, tried to keep RUE in sling position) Restrictions Weight Bearing Restrictions: Yes RUE Weight Bearing: Non weight bearing RLE Weight Bearing: Non weight bearing      Mobility Bed Mobility Overal bed mobility: Needs Assistance Bed Mobility: Supine to Sit;Sit to Supine Rolling: +2 for physical assistance;Total assist   Supine to sit: +2 for physical assistance;Mod assist Sit to supine: +2 for  physical assistance;Min assist   General bed mobility comments: increased time and effort, max cueing and encouragement for sequencing, assist with LEs and trunk elevation with use of bed pads to position pt's hips at EOB  Transfers                 General transfer comment: pt becoming quickly frustrated and agitated sitting EOB, yelling out "just leave me alone"    Balance Overall balance assessment: History of Falls;Needs assistance Sitting-balance support: Feet supported Sitting balance-Leahy Scale: Fair Sitting balance - Comments: tolerating sitting EOB for 10 minutes, at times without UE support.                                    ADL either performed or assessed with clinical judgement   ADL Overall ADL's : Needs assistance/impaired Eating/Feeding: Set up;Sitting Eating/Feeding Details (indicate cue type and reason): sipped on drink handed to her while sitting in bed Grooming: Sitting;Maximal assistance   Upper Body Bathing: Maximal assistance;Sitting;Bed level;Cueing for safety;Cueing for sequencing;Cueing for compensatory techniques;Cueing for UE precautions   Lower Body Bathing: Maximal assistance;Bed level;Sitting/lateral leans;Cueing for safety;Cueing for sequencing;Cueing for compensatory techniques   Upper Body Dressing : Maximal assistance;Sitting;Cueing for safety;Cueing for sequencing;Cueing for compensatory techniques;Cueing for UE precautions   Lower Body Dressing: Maximal assistance;Sitting/lateral leans;Bed level;Cueing for safety;Cueing for sequencing;Cueing for compensatory techniques                 General ADL Comments: Pt completed bed mobility and sat EOB  several minutes. Pt became agitated/anxious, returned to supine with assist. Pt needing assist to maintain NWB status.     Vision Baseline Vision/History: Wears glasses       Perception     Praxis      Pertinent Vitals/Pain Pain Assessment: Faces Faces Pain Scale:  Hurts even more Pain Location: generalized, brief intense reactions when sitting EOB Pain Descriptors / Indicators: Grimacing;Guarding Pain Intervention(s): Monitored during session;Repositioned     Hand Dominance Right   Extremity/Trunk Assessment Upper Extremity Assessment Upper Extremity Assessment: RUE deficits/detail;Difficult to assess due to impaired cognition RUE Deficits / Details: recent clavicle fx. did not MMT. Pt functionally moving RUE elbow level and distal. RUE: Unable to fully assess due to immobilization   Lower Extremity Assessment Lower Extremity Assessment: Defer to PT evaluation RLE Deficits / Details: Hard short leg cast in place; pt with reported sensation intact to all toes with light touch; MMT revealed 3/5 strength with hip flexion, knee flexion and knee extension RLE: Unable to fully assess due to pain;Unable to fully assess due to immobilization       Communication Communication Communication: No difficulties   Cognition Arousal/Alertness: Awake/alert Behavior During Therapy: Anxious;Agitated Overall Cognitive Status: History of cognitive impairments - at baseline Area of Impairment: Orientation;Memory;Following commands;Safety/judgement;Problem solving                 Orientation Level: Disoriented to;Time;Situation Current Attention Level: Focused Memory: Decreased short-term memory;Decreased recall of precautions Following Commands: Follows one step commands with increased time;Follows one step commands inconsistently Safety/Judgement: Decreased awareness of safety;Decreased awareness of deficits   Problem Solving: Slow processing;Decreased initiation;Difficulty sequencing;Requires verbal cues;Requires tactile cues General Comments: pt required multimodal cueing to follow commands   General Comments       Exercises     Shoulder Instructions      Home Living Family/patient expects to be discharged to:: Skilled nursing facility                                         Prior Functioning/Environment Level of Independence: Needs assistance  Gait / Transfers Assistance Needed: pt's son reporting that she was working on sliding board transfers with therapy ADL's / Homemaking Assistance Needed: required assistance   Comments: Has been at SNF for rehab, prior to that was at home        OT Problem List: Decreased strength;Decreased activity tolerance;Decreased coordination;Decreased safety awareness;Decreased cognition;Pain;Impaired UE functional use;Cardiopulmonary status limiting activity;Impaired balance (sitting and/or standing)      OT Treatment/Interventions: Self-care/ADL training;DME and/or AE instruction;Patient/family education;Balance training;Cognitive remediation/compensation;Therapeutic activities    OT Goals(Current goals can be found in the care plan section) Acute Rehab OT Goals Patient Stated Goal: Eventually be able to return home. OT Goal Formulation: With family Time For Goal Achievement: 10/01/17 Potential to Achieve Goals: Fair ADL Goals Pt Will Perform Grooming: with mod assist;sitting Additional ADL Goal #1: Caregivers will be indpendent with management of RUE precautions for ADLs and transfers.  OT Frequency: Min 2X/week   Barriers to D/C:            Co-evaluation PT/OT/SLP Co-Evaluation/Treatment: Yes Reason for Co-Treatment: For patient/therapist safety;To address functional/ADL transfers PT goals addressed during session: Mobility/safety with mobility;Balance;Strengthening/ROM OT goals addressed during session: ADL's and self-care      AM-PAC PT "6 Clicks" Daily Activity     Outcome Measure Help from another person eating meals?: A  Lot Help from another person taking care of personal grooming?: A Lot Help from another person toileting, which includes using toliet, bedpan, or urinal?: Total Help from another person bathing (including washing, rinsing, drying)?:  Total Help from another person to put on and taking off regular upper body clothing?: Total Help from another person to put on and taking off regular lower body clothing?: Total 6 Click Score: 8   End of Session    Activity Tolerance: Treatment limited secondary to agitation Patient left: in bed;with call bell/phone within reach;with bed alarm set;with family/visitor present;with SCD's reapplied  OT Visit Diagnosis: Cognitive communication deficit (R41.841);Pain;Muscle weakness (generalized) (M62.81);History of falling (Z91.81)                Time: 1638-4536 OT Time Calculation (min): 22 min Charges:  OT General Charges $OT Visit: 1 Visit OT Evaluation $OT Eval Low Complexity: 1 Low G-Codes:       Hortencia Pilar 09/17/2017, 12:00 PM

## 2017-09-17 NOTE — Progress Notes (Signed)
Patient ID: Caroline Blake, female   DOB: 1946/01/11, 72 y.o.   MRN: [id number] [facility] Progress Note   Assessment/ Plan:   1. AKIon CKD 3 (baseline in Care Everywhere 1.5-2) in patient with solitary kidney status post left nephrectomy:  Suspected to be from a combination of volume depletion from decreased oral intake in the setting of possible urinary tract infection with background of chronic kidney disease. Urine output charted within the oliguric range however, renal function noted to improve overnight with downtrending BUN/creatinine. We'll correct hypokalemia. 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 Originally suspected to be associated with prior history of total thyroidectomy and possible parathyroid removal however she has elevated PTH level which is the expected response for hypocalcemia. Ongoing supplementation with oral calcium and calcitriol. 3. Right-sided renal hypoechoic nodule: given her past history of RCC requiring left-sided nephrectomy, raising great concern for recurrent cancer. Unfortunately, poor overall picture may limit the aggressiveness of imaging/follow-up/surgery as the trajectory appears to be towards a comfort/palliative-based approach.  4. Anemia:hemoglobin currently stable and without any overt loss-posterior history significant for recurrent GI bleeds 5.  Recent CVA per primary and neurology.  Subjective:   Reports to be feeling fair, denies any chest pain or shortness of breath.   Objective:   BP 133/74 (BP Location: Right Arm)   Pulse 81   Temp 98.3 F (36.8 C) (Oral)   Resp 18   Ht 5' 4.5" (1.638 m)   Wt 61.9 kg (136 lb 6.4 oz)   SpO2 96%   BMI 23.05 kg/m   Intake/Output Summary (Last 24 hours) at 09/17/2017 6389 Last data filed at 09/17/2017 3734 Gross per 24 hour  Intake 960 ml  Output 300 ml  Net  660 ml   Weight change:   Physical Exam: KAJ:[id number] resting in bed, son at her side CVS: pulse regular rhythm, normal rate, S1 and S2 normal Resp:anteriorly clear to auscultation, no rales Abd: soft, flat, nontender TDH:RCBU leg no edema, right leg in cast  Imaging: US Renal  Result Date: 09/15/2017 CLINICAL DATA:  Post left nephrectomy for RCC. Diabetes. Acute renal insufficiency. EXAM: RENAL / URINARY TRACT ULTRASOUND COMPLETE COMPARISON:  Body CT 08/14/2017 FINDINGS: Right Kidney: Length: 11.2 cm. Mildly increased cortical echogenicity. There is a benign-appearing 1.2 cm cyst, exophytic off of the lower pole of the left kidney. There is a hypoechoic indeterminate nodule in the midpole region of the right kidney which measures 1.8 x 1.6 x 2.1 cm. There is a benign-appearing cyst in the lateral mid polar cortex of the right kidney measuring 1.8 cm Left Kidney: Surgically absent. Bladder: Appears normal for degree of bladder distention. IMPRESSION: Mildly increased cortical echogenicity of the right kidney, likely due to medical renal disease. 1.8 cm indeterminate hypoechoic nodule in the midpole region of the right kidney. CT or MRI with contrast, renal protocol, may be considered when clinically feasible, if further imaging evaluation is desired. Electronically Signed   By: Fidela Salisbury M.D.   On: 09/15/2017 15:58    Labs: BMET Recent Labs  Lab 09/14/17 1925 09/14/17 1932 09/15/17 0806 09/16/17 0731 09/17/17 0243  NA 140 139 141 142 142  K 4.0 4.0 3.4* 3.6 3.1*  CL 105 105 107 109 106  CO2 16*  --  11* 14* 19*  GLUCOSE 137* 134* 124* 102* 114*  BUN 101* 87* 121* 126* 119*  CREATININE 3.77* 3.90* 4.55* 4.75* 4.43*  CALCIUM 5.8*  --  5.6* 5.6* 6.0*  PHOS  --   --   --  7.9* 7.1*   CBC Recent Labs  Lab 09/14/17 1925 09/14/17 1932 09/15/17 0806 09/16/17 0807  WBC 15.0*  --  14.3* 14.7*  NEUTROABS 12.8*  --   --   --   HGB 13.8 15.0 13.8 12.5  HCT 43.1 44.0 42.4  39.2  MCV 79.8  --  79.4 79.0  PLT 292  --  294 278    Medications:    . aspirin  300 mg Rectal Daily   Or  . aspirin  325 mg Oral Daily  . calcitRIOL  0.25 mcg Oral Daily  . calcium carbonate  1 tablet Oral TID  . enoxaparin (LOVENOX) injection  30 mg Subcutaneous Q24H  . feeding supplement (ENSURE ENLIVE)  237 mL Oral BID BM  . insulin aspart  0-9 Units Subcutaneous Q4H  . levothyroxine  150 mcg Oral QAC breakfast  . magnesium chloride  2 tablet Oral BID  . multivitamin  15 mL Oral Daily   Elmarie Shiley, MD 09/17/2017, 9:38 AM

## 2017-09-17 NOTE — Progress Notes (Addendum)
Nurse gave patient morning medications and with in 10 mins. she was vomiting. Will hold whole the rest of meds right now. Will contact MD for something to help with nausea.

## 2017-09-17 NOTE — Evaluation (Signed)
Physical Therapy Evaluation Patient Details Name: Caroline Blake MRN: [id number] DOB: 1946/08/02 Today's Date: 09/17/2017   History of Present Illness  Pt is a 72 y/o female with PMH of recent stroke status post TPA, paroxysmal atrial fibrillation, history of GI bleed secondary to AVMs, hypertension, hyperlipidemia was brought to the ER after patient's family found that patient was getting increasingly worsening aphasia from baseline with increasing weakness. Pt admitted from SNF. Of note, pt recently admitted to [facility] in January 2019 with a CVA, multiple R foot fxs (NWB R LE) and R clavicular fx (NWB R UE).    Clinical Impression  Pt presented supine in bed with HOB elevated, awake and willing to participate in therapy session. Prior to admission, pt's son reported that she was receiving therapy services at [facility] where they were working on [employer] transfers with pt. Pt currently requires min-mod A x2 for bed mobility and min A to close min guard for sitting balance. Pt needing frequent reminders of NWB R UE. Pt became very frustrated and agitated sitting EOB, yelling out "just leave me alone"; therefore, transfers not attempted at this time. Pt would continue to benefit from skilled physical therapy services at this time while admitted and after d/c to address the below listed limitations in order to improve overall safety and independence with functional mobility.     Follow Up Recommendations SNF    Equipment Recommendations  None recommended by PT    Recommendations for Other Services       Precautions / Restrictions Precautions Precautions: Fall Restrictions Weight Bearing Restrictions: Yes RUE Weight Bearing: Non weight bearing RLE Weight Bearing: Non weight bearing      Mobility  Bed Mobility Overal bed mobility: Needs Assistance Bed Mobility: Supine to Sit;Sit to Supine     Supine to sit: +2 for physical assistance;Mod assist Sit to supine: +2 for physical  assistance;Min assist   General bed mobility comments: increased time and effort, max cueing and encouragement for sequencing, assist with LEs and trunk elevation with use of bed pads to position pt's hips at EOB  Transfers                 General transfer comment: pt becoming quickly frustrated and agitated sitting EOB, yelling out "just leave me alone"  Ambulation/Gait                Stairs            Wheelchair Mobility    Modified Rankin (Stroke Patients Only)       Balance Overall balance assessment: History of Falls;Needs assistance Sitting-balance support: Feet supported Sitting balance-Leahy Scale: Fair                                       Pertinent Vitals/Pain Pain Assessment: Faces Faces Pain Scale: Hurts even more Pain Location: generalized, brief intense reactions when sitting EOB Pain Descriptors / Indicators: Grimacing;Guarding Pain Intervention(s): Monitored during session;Repositioned    Home Living Family/patient expects to be discharged to:: Skilled nursing facility                      Prior Function Level of Independence: Needs assistance   Gait / Transfers Assistance Needed: pt's son reporting that she was working on Veterinary surgeon transfers with therapy  ADL's / Homemaking Assistance Needed: required assistance  Hand Dominance        Extremity/Trunk Assessment   Upper Extremity Assessment Upper Extremity Assessment: Defer to OT evaluation    Lower Extremity Assessment Lower Extremity Assessment: RLE deficits/detail RLE Deficits / Details: Hard short leg cast in place; pt with reported sensation intact to all toes with light touch; MMT revealed 3/5 strength with hip flexion, knee flexion and knee extension RLE: Unable to fully assess due to pain;Unable to fully assess due to immobilization       Communication   Communication: No difficulties  Cognition Arousal/Alertness:  Awake/alert Behavior During Therapy: Anxious;Agitated Overall Cognitive Status: History of cognitive impairments - at baseline Area of Impairment: Orientation;Memory;Following commands;Safety/judgement;Problem solving                 Orientation Level: Disoriented to;Time;Situation Current Attention Level: Focused Memory: Decreased short-term memory;Decreased recall of precautions Following Commands: Follows one step commands with increased time;Follows one step commands inconsistently Safety/Judgement: Decreased awareness of safety;Decreased awareness of deficits   Problem Solving: Slow processing;Decreased initiation;Difficulty sequencing;Requires verbal cues;Requires tactile cues General Comments: pt required multimodal cueing to follow commands      General Comments      Exercises     Assessment/Plan    PT Assessment Patient needs continued PT services  PT Problem List Decreased strength;Decreased range of motion;Decreased activity tolerance;Decreased balance;Decreased mobility;Decreased coordination;Decreased cognition;Decreased knowledge of use of DME;Decreased safety awareness;Decreased knowledge of precautions;Pain       PT Treatment Interventions DME instruction;Functional mobility training;Therapeutic activities;Therapeutic exercise;Balance training;Neuromuscular re-education;Cognitive remediation;Patient/family education    PT Goals (Current goals can be found in the Care Plan section)  Acute Rehab PT Goals Patient Stated Goal: Eventually be able to return home. PT Goal Formulation: With patient/family Time For Goal Achievement: 10/01/17 Potential to Achieve Goals: Fair    Frequency Min 2X/week   Barriers to discharge        Co-evaluation PT/OT/SLP Co-Evaluation/Treatment: Yes Reason for Co-Treatment: For patient/therapist safety;To address functional/ADL transfers PT goals addressed during session: Mobility/safety with  mobility;Balance;Strengthening/ROM         AM-PAC PT "6 Clicks" Daily Activity  Outcome Measure Difficulty turning over in bed (including adjusting bedclothes, sheets and blankets)?: Unable Difficulty moving from lying on back to sitting on the side of the bed? : Unable Difficulty sitting down on and standing up from a chair with arms (e.g., wheelchair, bedside commode, etc,.)?: Unable Help needed moving to and from a bed to chair (including a wheelchair)?: A Lot Help needed walking in hospital room?: Total Help needed climbing 3-5 steps with a railing? : Total 6 Click Score: 7    End of Session   Activity Tolerance: Patient limited by fatigue;Patient limited by pain Patient left: in bed;with call bell/phone within reach;with bed alarm set;with family/visitor present;Other (comment)(RN present) Nurse Communication: Mobility status;Need for lift equipment;Weight bearing status PT Visit Diagnosis: Other abnormalities of gait and mobility (R26.89)    Time: 6073-7106 PT Time Calculation (min) (ACUTE ONLY): 22 min   Charges:   PT Evaluation $PT Eval Moderate Complexity: 1 Mod     PT G Codes:        Severn, PT, DPT [place] 09/17/2017, 11:08 AM

## 2017-09-17 NOTE — Progress Notes (Addendum)
PROGRESS NOTE    [name]  [name] DOB: Oct 25, 1945 DOA: 09/14/2017 PCP: Drake Leach, MD   Brief Narrative: Caroline Blake is a 72 y.o. female with history of recent stroke status post TPA, paroxysmal atrial fibrillation, history of GI bleed secondary to AVMs, hypertension, hyperlipidemia. She presented with increased weakness thought to be secondary to stroke, however, MRI was negative. Found to have AKI on CKD and UTI. Nephrology consulted and started on ceftriaxone for UTI.   Assessment & Plan:   Principal Problem:   ARF (acute renal failure) (HCC) Active Problems:   Stroke (cerebrum) (HCC)   Paroxysmal atrial fibrillation (HCC)   History of seizure   Vascular dementia with behavior disturbance   Hypertension   Diabetes mellitus ([place])   AVM (arteriovenous malformation) of colon with hemorrhage   Aphasia   Acute kidney injury on CKD III Anion gap metabolic acidosis Creatinine appears to have peaked. Patient with some incontinence overnight, but appears she is not oliguric. Patient on Bicarb fluids for acidosis. -Nephrology recommendations: IV fluids  Hypocalcemia Possibly secondary to renal disease. This is chronic from at least early 2018 per chart review. PTH elevated. Improving. Asymptomatic. Corrected calcium around 7.6 mg/dL -Nephrology recommendations: Tums, calcitriol, magnesium, vitamin D  History of CVA Initial concern for acute CVA which was ruled out with MRI. -Continue aspirin  UTI Preliminary significant for gram negative rods. Anticipate update today. -Continue ceftriaxone -culture pending  Hypothyroidism -Continue Synthroid  Paroxysmal atrial fibrillation Currently not on anticoagulation secondary to history of GI bleeding. Rate currently controlled.  History of diabetes mellitus Diet controlled. Last hemoglobin A1C of 6.5%. -Continue SSI  Renal nodule Seen on renal ultrasound. Would require imaging with contrast for further  evaluation, which patient cannot tolerate at this time secondary to AKI. Patient with a history of renal cell carcinoma.    DVT prophylaxis: Lovenox Code Status: DNR Family Communication: Husband on telephone Disposition Plan: When medically stable, likely back to SNF. PT/OT eval   Consultants:   Nephrology  Procedures:   None  Antimicrobials:  Ceftriaxone   Subjective: Patient feels better today. No concerns. Wants to go home.  Objective: Vitals:   09/16/17 1730 09/16/17 2200 09/17/17 0200 09/17/17 0610  BP: (!) 144/74 (!) 146/74 135/66 133/74  Pulse: 84 77 84 81  Resp: 18 18 18 18   Temp: 98.3 F (36.8 C) 98.2 F (36.8 C) 98.5 F (36.9 C) 98.3 F (36.8 C)  TempSrc: Oral Oral Oral Oral  SpO2: 95% 97% 95% 96%  Weight:      Height:        Intake/Output Summary (Last 24 hours) at 09/17/2017 0805 Last data filed at 09/16/2017 1700 Gross per 24 hour  Intake 480 ml  Output 300 ml  Net 180 ml   Filed Weights   09/15/17 0131 09/15/17 1002  Weight: 63.1 kg (139 lb 1.8 oz) 61.9 kg (136 lb 6.4 oz)    Examination:  General exam: Appears calm and comfortable Respiratory system: Respiratory effort normal. Central nervous system: Alert and oriented. Bilateral arm tremors Extremities: No edema. No calf tenderness Skin: Ecchymosis on forearms appears improved Psychiatry: Judgement and insight appear normal. Patient irritated.    Data Reviewed: I have personally reviewed following labs and imaging studies  CBC: Recent Labs  Lab 09/14/17 1925 09/14/17 1932 09/15/17 0806 09/16/17 0807  WBC 15.0*  --  14.3* 14.7*  NEUTROABS 12.8*  --   --   --   HGB 13.8 15.0 13.8 12.5  HCT 43.1 44.0 42.4 39.2  MCV 79.8  --  79.4 79.0  PLT 292  --  294 259   Basic Metabolic Panel: Recent Labs  Lab 09/14/17 1925 09/14/17 1932 09/15/17 0806 09/16/17 0731 09/17/17 0243  NA 140 139 141 142 142  K 4.0 4.0 3.4* 3.6 3.1*  CL 105 105 107 109 106  CO2 16*  --  11* 14* 19*    GLUCOSE 137* 134* 124* 102* 114*  BUN 101* 87* 121* 126* 119*  CREATININE 3.77* 3.90* 4.55* 4.75* 4.43*  CALCIUM 5.8*  --  5.6* 5.6* 6.0*  PHOS  --   --   --  7.9* 7.1*   GFR: Estimated Creatinine Clearance: 10.4 mL/min (A) (by C-G formula based on SCr of 4.43 mg/dL (H)). Liver Function Tests: Recent Labs  Lab 09/14/17 1925 09/15/17 0806 09/16/17 0731 09/17/17 0243  AST 26 24  --   --   ALT 15 18  --   --   ALKPHOS 178* 173*  --   --   BILITOT 0.5 0.3  --   --   PROT 4.9* 4.7*  --   --   ALBUMIN 2.5* 2.3* 2.0* 2.0*   No results for input(s): LIPASE, AMYLASE in the last 168 hours. No results for input(s): AMMONIA in the last 168 hours. Coagulation Profile: Recent Labs  Lab 09/14/17 1925  INR 1.25   Cardiac Enzymes: Recent Labs  Lab 09/15/17 0806 09/15/17 1306 09/15/17 2029  TROPONINI <0.03 <0.03 <0.03   BNP (last 3 results) No results for input(s): PROBNP in the last 8760 hours. HbA1C: No results for input(s): HGBA1C in the last 72 hours. CBG: Recent Labs  Lab 09/16/17 1611 09/16/17 2000 09/16/17 2356 09/17/17 0431 09/17/17 0757  GLUCAP 148* 130* 113* 101* 101*   Lipid Profile: No results for input(s): CHOL, HDL, LDLCALC, TRIG, CHOLHDL, LDLDIRECT in the last 72 hours. Thyroid Function Tests: No results for input(s): TSH, T4TOTAL, FREET4, T3FREE, THYROIDAB in the last 72 hours. Anemia Panel: No results for input(s): VITAMINB12, FOLATE, FERRITIN, TIBC, IRON, RETICCTPCT in the last 72 hours. Sepsis Labs: Recent Labs  Lab 09/15/17 0806 09/15/17 1129  LATICACIDVEN 1.1 1.1    Recent Results (from the past 240 hour(s))  Urine culture     Status: Abnormal (Preliminary result)   Collection Time: 09/14/17  9:27 PM  Result Value Ref Range Status   Specimen Description URINE, RANDOM  Final   Special Requests   Final    NONE Performed at [facility], [place] [address], [place], [place] [address]    Culture >=100,000 COLONIES/mL GRAM NEGATIVE RODS (A)   Final   Report Status PENDING  Incomplete         Radiology Studies: US Renal  Result Date: 09/15/2017 CLINICAL DATA:  Post left nephrectomy for RCC. Diabetes. Acute renal insufficiency. EXAM: RENAL / URINARY TRACT ULTRASOUND COMPLETE COMPARISON:  Body CT 08/14/2017 FINDINGS: Right Kidney: Length: 11.2 cm. Mildly increased cortical echogenicity. There is a benign-appearing 1.2 cm cyst, exophytic off of the lower pole of the left kidney. There is a hypoechoic indeterminate nodule in the midpole region of the right kidney which measures 1.8 x 1.6 x 2.1 cm. There is a benign-appearing cyst in the lateral mid polar cortex of the right kidney measuring 1.8 cm Left Kidney: Surgically absent. Bladder: Appears normal for degree of bladder distention. IMPRESSION: Mildly increased cortical echogenicity of the right kidney, likely due to medical renal disease. 1.8 cm indeterminate hypoechoic nodule  in the midpole region of the right kidney. CT or MRI with contrast, renal protocol, may be considered when clinically feasible, if further imaging evaluation is desired. Electronically Signed   By: Fidela Salisbury M.D.   On: 09/15/2017 15:58        Scheduled Meds: . aspirin  300 mg Rectal Daily   Or  . aspirin  325 mg Oral Daily  . calcitRIOL  0.25 mcg Oral Daily  . calcium carbonate  1 tablet Oral TID  . enoxaparin (LOVENOX) injection  30 mg Subcutaneous Q24H  . feeding supplement (ENSURE ENLIVE)  237 mL Oral BID BM  . insulin aspart  0-9 Units Subcutaneous Q4H  . levothyroxine  150 mcg Oral QAC breakfast  . magnesium chloride  2 tablet Oral BID  . multivitamin  15 mL Oral Daily  . potassium chloride  40 mEq Oral Once   Continuous Infusions: . cefTRIAXone (ROCEPHIN)  IV Stopped (09/16/17 2230)  . dextrose 5 % 1,000 mL with sodium bicarbonate 150 mEq infusion 100 mL/hr at 09/16/17 1136     LOS: 2 days     Cordelia Poche, MD [facility] 09/17/2017, 8:05 AM Pager: [phone]  If  7PM-7AM, please contact night-coverage [email or web address] Password [email or web address] 09/17/2017, 8:05 AM

## 2017-09-18 LAB — RENAL FUNCTION PANEL
ALBUMIN: 2 g/dL — AB (ref 3.5–5.0)
ANION GAP: 15 (ref 5–15)
BUN: 114 mg/dL — ABNORMAL HIGH (ref 6–20)
CALCIUM: 6.6 mg/dL — AB (ref 8.9–10.3)
CO2: 19 mmol/L — ABNORMAL LOW (ref 22–32)
Chloride: 109 mmol/L (ref 101–111)
Creatinine, Ser: 3.84 mg/dL — ABNORMAL HIGH (ref 0.44–1.00)
GFR calc non Af Amer: 11 mL/min — ABNORMAL LOW (ref >60)
GFR, EST AFRICAN AMERICAN: 13 mL/min — AB (ref >60)
GLUCOSE: 100 mg/dL — AB (ref 65–99)
PHOSPHORUS: 5.8 mg/dL — AB (ref 2.5–4.6)
Potassium: 3.9 mmol/L (ref 3.5–5.1)
SODIUM: 143 mmol/L (ref 135–145)

## 2017-09-18 LAB — GLUCOSE, CAPILLARY
GLUCOSE-CAPILLARY: 129 mg/dL — AB (ref 65–99)
GLUCOSE-CAPILLARY: 104 mg/dL — AB (ref 65–99)
Glucose-Capillary: 116 mg/dL — ABNORMAL HIGH (ref 65–99)
Glucose-Capillary: 120 mg/dL — ABNORMAL HIGH (ref 65–99)
Glucose-Capillary: 129 mg/dL — ABNORMAL HIGH (ref 65–99)
Glucose-Capillary: 94 mg/dL (ref 65–99)

## 2017-09-18 MED ORDER — CALCIUM CARBONATE ANTACID 500 MG PO CHEW
400.0000 mg | CHEWABLE_TABLET | Freq: Three times a day (TID) | ORAL | Status: DC
  Administered 2017-09-18 – 2017-09-21 (×8): 400 mg via ORAL
  Filled 2017-09-18 (×9): qty 2

## 2017-09-18 NOTE — Progress Notes (Signed)
PROGRESS NOTE    TAKERRA Blake  XTK:[id number] DOB: [birth year] DOA: 09/14/2017 PCP: Drake Leach, MD   Brief Narrative: Caroline Blake is a 72 y.o. female with history of recent stroke status post TPA, paroxysmal atrial fibrillation, history of GI bleed secondary to AVMs, hypertension, hyperlipidemia. She presented with increased weakness thought to be secondary to stroke, however, MRI was negative. Found to have AKI on CKD and UTI. Nephrology consulted and started on ceftriaxone for UTI.   Assessment & Plan:   Principal Problem:   ARF (acute renal failure) (HCC) Active Problems:   Stroke (cerebrum) (HCC)   Paroxysmal atrial fibrillation (HCC)   History of seizure   Vascular dementia with behavior disturbance   Hypertension   Diabetes mellitus ([place])   AVM (arteriovenous malformation) of colon with hemorrhage   Aphasia   Acute kidney injury on CKD III Anion gap metabolic acidosis Creatinine appears to have peaked. Poor urine output measurements secondary to incontinence. Patient on Bicarb fluids for acidosis. Improving. -Nephrology recommendations: IV fluids  Hypocalcemia Possibly secondary to renal disease. This is chronic from at least early 2018 per chart review. PTH elevated. Improving. Asymptomatic. Corrected calcium around 7.6 mg/dL -Nephrology recommendations: Tums, calcitriol, magnesium, vitamin D  History of CVA Initial concern for acute CVA which was ruled out with MRI. -Continue aspirin  UTI Preliminary significant for gram negative rods. Final results states multiple species. -Continue ceftriaxone  Hypothyroidism -Continue Synthroid  Paroxysmal atrial fibrillation Currently not on anticoagulation secondary to history of GI bleeding. On review of telemetry, patient with intermittent tachycardia. Infrequent PVCs. Incorrectly labeled pause is a PVC. -EKG  History of diabetes mellitus Diet controlled. Last hemoglobin A1C of 6.5%. -Continue SSI  Renal  nodule Seen on renal ultrasound. Would require imaging with contrast for further evaluation, which patient cannot tolerate at this time secondary to AKI. Patient with a history of renal cell carcinoma. Outpatient follow-up.    DVT prophylaxis: Lovenox Code Status: DNR Family Communication: Son at bedside Disposition Plan: When medically stable, likely back to SNF.   Consultants:   Nephrology  Procedures:   None  Antimicrobials:  Ceftriaxone   Subjective: No concerns today. Feeling better  Objective: Vitals:   09/18/17 0101 09/18/17 0536 09/18/17 0600 09/18/17 0900  BP: 136/71 (!) 101/51 123/81 138/79  Pulse: 81 68 (!) 109 79  Resp: 18 18 18 20   Temp: 97.9 F (36.6 C) 98.2 F (36.8 C) 98.8 F (37.1 C) (!) 97.5 F (36.4 C)  TempSrc: Oral Oral Oral Oral  SpO2: 94% 99% 96% 96%  Weight:      Height:        Intake/Output Summary (Last 24 hours) at 09/18/2017 0957 Last data filed at 09/18/2017 0000 Gross per 24 hour  Intake 120 ml  Output 200 ml  Net -80 ml   Filed Weights   09/15/17 0131 09/15/17 1002  Weight: 63.1 kg (139 lb 1.8 oz) 61.9 kg (136 lb 6.4 oz)    Examination:  General exam: Appears calm and comfortable Respiratory system: Respiratory effort normal. Central nervous system: Alert and oriented. Bilateral arm tremors Extremities: No edema. No calf tenderness Skin: Ecchymosis on forearms appears improved Psychiatry: Judgement and insight appear normal. Patient irritated.    Data Reviewed: I have personally reviewed following labs and imaging studies  CBC: Recent Labs  Lab 09/14/17 1925 09/14/17 1932 09/15/17 0806 09/16/17 0807  WBC 15.0*  --  14.3* 14.7*  NEUTROABS 12.8*  --   --   --  HGB 13.8 15.0 13.8 12.5  HCT 43.1 44.0 42.4 39.2  MCV 79.8  --  79.4 79.0  PLT 292  --  294 732   Basic Metabolic Panel: Recent Labs  Lab 09/14/17 1925 09/14/17 1932 09/15/17 0806 09/16/17 0731 09/17/17 0243 09/18/17 0449  NA 140 139 141 142  142 143  K 4.0 4.0 3.4* 3.6 3.1* 3.9  CL 105 105 107 109 106 109  CO2 16*  --  11* 14* 19* 19*  GLUCOSE 137* 134* 124* 102* 114* 100*  BUN 101* 87* 121* 126* 119* 114*  CREATININE 3.77* 3.90* 4.55* 4.75* 4.43* 3.84*  CALCIUM 5.8*  --  5.6* 5.6* 6.0* 6.6*  PHOS  --   --   --  7.9* 7.1* 5.8*   GFR: Estimated Creatinine Clearance: 12 mL/min (A) (by C-G formula based on SCr of 3.84 mg/dL (H)). Liver Function Tests: Recent Labs  Lab 09/14/17 1925 09/15/17 0806 09/16/17 0731 09/17/17 0243 09/18/17 0449  AST 26 24  --   --   --   ALT 15 18  --   --   --   ALKPHOS 178* 173*  --   --   --   BILITOT 0.5 0.3  --   --   --   PROT 4.9* 4.7*  --   --   --   ALBUMIN 2.5* 2.3* 2.0* 2.0* 2.0*   No results for input(s): LIPASE, AMYLASE in the last 168 hours. No results for input(s): AMMONIA in the last 168 hours. Coagulation Profile: Recent Labs  Lab 09/14/17 1925  INR 1.25   Cardiac Enzymes: Recent Labs  Lab 09/15/17 0806 09/15/17 1306 09/15/17 2029  TROPONINI <0.03 <0.03 <0.03   BNP (last 3 results) No results for input(s): PROBNP in the last 8760 hours. HbA1C: No results for input(s): HGBA1C in the last 72 hours. CBG: Recent Labs  Lab 09/17/17 1721 09/17/17 2051 09/18/17 0043 09/18/17 0452 09/18/17 0827  GLUCAP 113* 101* 116* 129* 94   Lipid Profile: No results for input(s): CHOL, HDL, LDLCALC, TRIG, CHOLHDL, LDLDIRECT in the last 72 hours. Thyroid Function Tests: No results for input(s): TSH, T4TOTAL, FREET4, T3FREE, THYROIDAB in the last 72 hours. Anemia Panel: No results for input(s): VITAMINB12, FOLATE, FERRITIN, TIBC, IRON, RETICCTPCT in the last 72 hours. Sepsis Labs: Recent Labs  Lab 09/15/17 0806 09/15/17 1129  LATICACIDVEN 1.1 1.1    Recent Results (from the past 240 hour(s))  Urine culture     Status: Abnormal   Collection Time: 09/14/17  9:27 PM  Result Value Ref Range Status   Specimen Description URINE, RANDOM  Final   Special Requests   Final     NONE Performed at [facility], [place] [address], [place], [place] [address]    Culture MULTIPLE SPECIES PRESENT, SUGGEST RECOLLECTION (A)  Final   Report Status 09/17/2017 FINAL  Final         Radiology Studies: No results found.      Scheduled Meds: . aspirin  300 mg Rectal Daily   Or  . aspirin  325 mg Oral Daily  . calcitRIOL  0.25 mcg Oral Daily  . calcium carbonate  800 mg of elemental calcium Oral TID WC  . enoxaparin (LOVENOX) injection  30 mg Subcutaneous Q24H  . feeding supplement (ENSURE ENLIVE)  237 mL Oral BID BM  . insulin aspart  0-9 Units Subcutaneous Q4H  . levothyroxine  150 mcg Oral QAC breakfast  . magnesium chloride  2 tablet  Oral BID  . multivitamin  15 mL Oral Daily  . potassium chloride  20 mEq Oral BID   Continuous Infusions: . cefTRIAXone (ROCEPHIN)  IV Stopped (09/17/17 2127)  . dextrose 5 % 1,000 mL with sodium bicarbonate 150 mEq infusion 100 mL/hr at 09/16/17 1136     LOS: 3 days     Cordelia Poche, MD [facility] 09/18/2017, 9:57 AM Pager: [phone]  If 7PM-7AM, please contact night-coverage [email or web address] Password [facility] 09/18/2017, 9:57 AM

## 2017-09-18 NOTE — Clinical Social Work Note (Signed)
CSW continuing to follow for medically stability.    [place], [place]

## 2017-09-18 NOTE — Care Management Note (Signed)
Case Management Note  Patient Details  Name: Caroline Blake MRN: [id number] Date of Birth: 07-Jun-1946  Subjective/Objective:  72 y.o. F admitted from SNF with increased aphasia. Hx recent stroke, s/p TPA.                   Action/Plan: Will defer d/c planning to CSW for return to SNF   Expected Discharge Date:                  Expected Discharge Plan:  [place]  In-House Referral:  Clinical Social Work  Discharge planning Services  CM Consult  Post Acute Care Choice:    Choice offered to:     DME Arranged:    DME Agency:     HH Arranged:    [place] Agency:     Status of Service:  Completed, signed off  If discussed at [employer], dates discussed:    Additional Comments:  Delrae Sawyers, RN 09/18/2017, 8:56 AM

## 2017-09-18 NOTE — Progress Notes (Signed)
[name] Progress Note    Assessment/ Plan:   Caroline Blake is a 72 y.o. female with PMH significant for recent stroke status post TPA, paroxysmal atrial fibrillation, history of GI bleed secondary to AVMs, hypertension, hypothyroidism, hyperlipidemia, R renal cancer s/p nephrectomy (10 years ago), urethral stricture, who presented with worsening aphasia and weakness.   1. AKI on CKD 3 (baseline in Care Everywhere 1.5-2): Worsened. UA collected in ED with likely UTI. Cultures in process with >100,000 GNRs. Differential includes prerenal (possible poor PO given deficits from CVA, ?sepsis 2/2 UTI with altered mentation), intrinsic (AIN 2/2 recent abx exposure during last admission). Renal US without obstruction, however includes hypoechoic nodule in R kidney. -continue IV hydration -consider foley if unable to obtain strict I/O -Daily RFPs  2. Hypocalcemia, improving: has been present in [place] labs since 07/2016. Likely due to malnutrition or vitamin D deficiency with elevated PTH. Ionized calcium low at 3.9 on admission. Calcium improving this morning at 6.6 >> Corrects to 9.0.  Phosphorus improving 5.8 from 7.1.  -getting calcium gluconate IV per primary -  tums 750mg  TID, PO magnesium 400 BID, calcitriol 0.20mcg daily >> consider discontinuing today -PTH elevated at 73  3. Renal nodule: will likely need outpatient urology follow up, question recurrence of RCC. This plan will hinge on palliative consult as well.  4. Anemia: hx of multiple GI bleeds.  5.  Recent CVA per primary and neurology.   Subjective:   Patient did well overnight with no acute events .She rests comfortably this morning   Objective:   BP 138/79 (BP Location: Left Arm)   Pulse 79   Temp (!) 97.5 F (36.4 C) (Oral)   Resp 20   Ht 5' 4.5" (1.638 m)   Wt 136 lb 6.4 oz (61.9 kg)   SpO2 96%   BMI 23.05 kg/m   Intake/Output Summary (Last 24 hours) at 09/18/2017 1036 Last data filed at  09/18/2017 0000 Gross per 24 hour  Intake 120 ml  Output 200 ml  Net -80 ml   Weight change:   Physical Exam:  Gen: Elderly female, NAD CVS: RRR, no m/r/g Resp: coarse breath sounds throughout, comfortable WOB Abd: soft, nontender, nondistended Ext: no edema, cast on RLE  Imaging: No results found.  Labs: BMET Recent Labs  Lab 09/14/17 1925 09/14/17 1932 09/15/17 0806 09/16/17 0731 09/17/17 0243 09/18/17 0449  NA 140 139 141 142 142 143  K 4.0 4.0 3.4* 3.6 3.1* 3.9  CL 105 105 107 109 106 109  CO2 16*  --  11* 14* 19* 19*  GLUCOSE 137* 134* 124* 102* 114* 100*  BUN 101* 87* 121* 126* 119* 114*  CREATININE 3.77* 3.90* 4.55* 4.75* 4.43* 3.84*  CALCIUM 5.8*  --  5.6* 5.6* 6.0* 6.6*  PHOS  --   --   --  7.9* 7.1* 5.8*   CBC Recent Labs  Lab 09/14/17 1925 09/14/17 1932 09/15/17 0806 09/16/17 0807  WBC 15.0*  --  14.3* 14.7*  NEUTROABS 12.8*  --   --   --   HGB 13.8 15.0 13.8 12.5  HCT 43.1 44.0 42.4 39.2  MCV 79.8  --  79.4 79.0  PLT 292  --  294 278    Medications:    . aspirin  300 mg Rectal Daily   Or  . aspirin  325 mg Oral Daily  . calcitRIOL  0.25 mcg Oral Daily  . calcium carbonate  800 mg of elemental calcium Oral  TID WC  . enoxaparin (LOVENOX) injection  30 mg Subcutaneous Q24H  . feeding supplement (ENSURE ENLIVE)  237 mL Oral BID BM  . insulin aspart  0-9 Units Subcutaneous Q4H  . levothyroxine  150 mcg Oral QAC breakfast  . magnesium chloride  2 tablet Oral BID  . multivitamin  15 mL Oral Daily  . potassium chloride  20 mEq Oral BID    Caroline Coombe, MD 09/18/17

## 2017-09-19 LAB — RENAL FUNCTION PANEL
ALBUMIN: 1.9 g/dL — AB (ref 3.5–5.0)
Anion gap: 11 (ref 5–15)
BUN: 98 mg/dL — AB (ref 6–20)
CHLORIDE: 113 mmol/L — AB (ref 101–111)
CO2: 21 mmol/L — ABNORMAL LOW (ref 22–32)
CREATININE: 3.3 mg/dL — AB (ref 0.44–1.00)
Calcium: 6.9 mg/dL — ABNORMAL LOW (ref 8.9–10.3)
GFR calc non Af Amer: 13 mL/min — ABNORMAL LOW (ref >60)
GFR, EST AFRICAN AMERICAN: 15 mL/min — AB (ref >60)
Glucose, Bld: 122 mg/dL — ABNORMAL HIGH (ref 65–99)
Phosphorus: 4 mg/dL (ref 2.5–4.6)
Potassium: 4.6 mmol/L (ref 3.5–5.1)
Sodium: 145 mmol/L (ref 135–145)

## 2017-09-19 LAB — GLUCOSE, CAPILLARY
GLUCOSE-CAPILLARY: 109 mg/dL — AB (ref 65–99)
GLUCOSE-CAPILLARY: 163 mg/dL — AB (ref 65–99)
GLUCOSE-CAPILLARY: 94 mg/dL (ref 65–99)
Glucose-Capillary: 118 mg/dL — ABNORMAL HIGH (ref 65–99)
Glucose-Capillary: 125 mg/dL — ABNORMAL HIGH (ref 65–99)
Glucose-Capillary: 93 mg/dL (ref 65–99)

## 2017-09-19 LAB — MAGNESIUM: Magnesium: 2.5 mg/dL — ABNORMAL HIGH (ref 1.7–2.4)

## 2017-09-19 LAB — VITAMIN D 25 HYDROXY (VIT D DEFICIENCY, FRACTURES): Vit D, 25-Hydroxy: 16.2 ng/mL — ABNORMAL LOW (ref 30.0–100.0)

## 2017-09-19 MED ORDER — ZINC OXIDE 40 % EX OINT
TOPICAL_OINTMENT | Freq: Four times a day (QID) | CUTANEOUS | Status: DC
  Administered 2017-09-19 – 2017-09-20 (×4): via TOPICAL
  Administered 2017-09-20: 1 via TOPICAL
  Administered 2017-09-21: 09:00:00 via TOPICAL
  Filled 2017-09-19 (×2): qty 114

## 2017-09-19 MED ORDER — SODIUM CHLORIDE 0.9 % IV SOLN: 1.0000 g | INTRAVENOUS | Status: DC

## 2017-09-19 NOTE — Progress Notes (Signed)
PROGRESS NOTE    Caroline Blake  HAL:[id number] DOB: 21-Nov-1945 DOA: 09/14/2017 PCP: Drake Leach, MD   Brief Narrative: Caroline Blake is a 72 y.o. female with history of recent stroke status post TPA, paroxysmal atrial fibrillation, history of GI bleed secondary to AVMs, hypertension, hyperlipidemia. She presented with increased weakness thought to be secondary to stroke, however, MRI was negative. Found to have AKI on CKD and UTI. Nephrology consulted and started on ceftriaxone for UTI.   Assessment & Plan:   Principal Problem:   ARF (acute renal failure) (HCC) Active Problems:   Stroke (cerebrum) (HCC)   Paroxysmal atrial fibrillation (HCC)   History of seizure   Vascular dementia with behavior disturbance   Hypertension   Diabetes mellitus ([place])   AVM (arteriovenous malformation) of colon with hemorrhage   Aphasia   Acute kidney injury on CKD III Anion gap metabolic acidosis Creatinine appears to have peaked. Poor urine output measurements secondary to incontinence. Patient on Bicarb fluids for acidosis. Continues to improve. -Nephrology recommendations: IV fluids  Hypocalcemia Possibly secondary to renal disease. This is chronic from at least early 2018 per chart review. PTH elevated. Improving. Asymptomatic. Corrected calcium around 7.6 mg/dL -Nephrology recommendations: Tums, calcitriol, magnesium, vitamin D  History of CVA Initial concern for acute CVA which was ruled out with MRI. -Continue aspirin  UTI Preliminary significant for gram negative rods. Final results states multiple species. Completed course of ceftriaxone. Slightly confused today.   Confusion Slightly confused. Recently treated for UTI however urine culture with multiple organisms -See if clears up this afternoon. If not, will re-culture.  Hypothyroidism -Continue Synthroid  Paroxysmal atrial fibrillation Currently not on anticoagulation secondary to history of GI bleeding. On review of  telemetry, patient with intermittent tachycardia. Infrequent PVCs. Incorrectly labeled pause is a PVC. Patient with intermittent, non-sustained episodes of RVR. -repeat EKG today  History of diabetes mellitus Diet controlled. Last hemoglobin A1C of 6.5%. -Continue SSI  Renal nodule Seen on renal ultrasound. Would require imaging with contrast for further evaluation, which patient cannot tolerate at this time secondary to AKI. Patient with a history of renal cell carcinoma. Outpatient follow-up.    DVT prophylaxis: Lovenox Code Status: DNR Family Communication: None at bedside Disposition Plan: When medically stable, likely back to SNF.   Consultants:   Nephrology  Procedures:   None  Antimicrobials:  Ceftriaxone   Subjective: No concerns today. Feeling better  Objective: Vitals:   09/18/17 2224 09/19/17 0208 09/19/17 0535 09/19/17 0920  BP: (!) 159/79 (!) 141/70 (!) 173/86 (!) 148/93  Pulse: 84 84 88 89  Resp: 20 20 20 20   Temp: 98.5 F (36.9 C) 98.5 F (36.9 C) 97.7 F (36.5 C) 98 F (36.7 C)  TempSrc: Oral Oral Axillary Oral  SpO2: 98% 96% 97% 97%  Weight:      Height:        Intake/Output Summary (Last 24 hours) at 09/19/2017 1115 Last data filed at 09/18/2017 1908 Gross per 24 hour  Intake 120 ml  Output -  Net 120 ml   Filed Weights   09/15/17 0131 09/15/17 1002  Weight: 63.1 kg (139 lb 1.8 oz) 61.9 kg (136 lb 6.4 oz)    Examination:  General exam: Appears calm and comfortable Respiratory system: Clear to auscultation bilaterally. Unlabored work of breathing. No wheezing or rales. Cardiovascular: Regular rate and rhythm. Normal S1 and S2. No heart murmurs present. No extra heart sounds Central nervous system: Alert and slightly confused this morning. Bilateral  arm tremors Extremities: No edema. No calf tenderness Skin: Ecchymosis on forearms appears improved    Data Reviewed: I have personally reviewed following labs and imaging  studies  CBC: Recent Labs  Lab 09/14/17 1925 09/14/17 1932 09/15/17 0806 09/16/17 0807  WBC 15.0*  --  14.3* 14.7*  NEUTROABS 12.8*  --   --   --   HGB 13.8 15.0 13.8 12.5  HCT 43.1 44.0 42.4 39.2  MCV 79.8  --  79.4 79.0  PLT 292  --  294 315   Basic Metabolic Panel: Recent Labs  Lab 09/15/17 0806 09/16/17 0731 09/17/17 0243 09/18/17 0449 09/19/17 0204  NA 141 142 142 143 145  K 3.4* 3.6 3.1* 3.9 4.6  CL 107 109 106 109 113*  CO2 11* 14* 19* 19* 21*  GLUCOSE 124* 102* 114* 100* 122*  BUN 121* 126* 119* 114* 98*  CREATININE 4.55* 4.75* 4.43* 3.84* 3.30*  CALCIUM 5.6* 5.6* 6.0* 6.6* 6.9*  MG  --   --   --   --  2.5*  PHOS  --  7.9* 7.1* 5.8* 4.0   GFR: Estimated Creatinine Clearance: 14 mL/min (A) (by C-G formula based on SCr of 3.3 mg/dL (H)). Liver Function Tests: Recent Labs  Lab 09/14/17 1925 09/15/17 0806 09/16/17 0731 09/17/17 0243 09/18/17 0449 09/19/17 0204  AST 26 24  --   --   --   --   ALT 15 18  --   --   --   --   ALKPHOS 178* 173*  --   --   --   --   BILITOT 0.5 0.3  --   --   --   --   PROT 4.9* 4.7*  --   --   --   --   ALBUMIN 2.5* 2.3* 2.0* 2.0* 2.0* 1.9*   No results for input(s): LIPASE, AMYLASE in the last 168 hours. No results for input(s): AMMONIA in the last 168 hours. Coagulation Profile: Recent Labs  Lab 09/14/17 1925  INR 1.25   Cardiac Enzymes: Recent Labs  Lab 09/15/17 0806 09/15/17 1306 09/15/17 2029  TROPONINI <0.03 <0.03 <0.03   BNP (last 3 results) No results for input(s): PROBNP in the last 8760 hours. HbA1C: No results for input(s): HGBA1C in the last 72 hours. CBG: Recent Labs  Lab 09/18/17 1137 09/18/17 1628 09/18/17 2020 09/19/17 0059 09/19/17 0432  GLUCAP 129* 104* 120* 163* 109*   Lipid Profile: No results for input(s): CHOL, HDL, LDLCALC, TRIG, CHOLHDL, LDLDIRECT in the last 72 hours. Thyroid Function Tests: No results for input(s): TSH, T4TOTAL, FREET4, T3FREE, THYROIDAB in the last 72  hours. Anemia Panel: No results for input(s): VITAMINB12, FOLATE, FERRITIN, TIBC, IRON, RETICCTPCT in the last 72 hours. Sepsis Labs: Recent Labs  Lab 09/15/17 0806 09/15/17 1129  LATICACIDVEN 1.1 1.1    Recent Results (from the past 240 hour(s))  Urine culture     Status: Abnormal   Collection Time: 09/14/17  9:27 PM  Result Value Ref Range Status   Specimen Description URINE, RANDOM  Final   Special Requests   Final    NONE Performed at [facility], [place] [address] [place], [place] [address]    Culture MULTIPLE SPECIES PRESENT, SUGGEST RECOLLECTION (A)  Final   Report Status 09/17/2017 FINAL  Final         Radiology Studies: No results found.      Scheduled Meds: . aspirin  300 mg Rectal Daily  Or  . aspirin  325 mg Oral Daily  . calcitRIOL  0.25 mcg Oral Daily  . calcium carbonate  400 mg of elemental calcium Oral TID WC  . enoxaparin (LOVENOX) injection  30 mg Subcutaneous Q24H  . feeding supplement (ENSURE ENLIVE)  237 mL Oral BID BM  . insulin aspart  0-9 Units Subcutaneous Q4H  . levothyroxine  150 mcg Oral QAC breakfast  . magnesium chloride  2 tablet Oral BID  . multivitamin  15 mL Oral Daily  . potassium chloride  20 mEq Oral BID   Continuous Infusions: . cefTRIAXone (ROCEPHIN)  IV    . dextrose 5 % 1,000 mL with sodium bicarbonate 150 mEq infusion 100 mL/hr at 09/18/17 2157     LOS: 4 days     Cordelia Poche, MD [facility] 09/19/2017, 11:15 AM Pager: [phone]  If 7PM-7AM, please contact night-coverage [email or web address] Password [facility] 09/19/2017, 11:15 AM

## 2017-09-19 NOTE — Progress Notes (Signed)
[facility] Progress Note    Assessment/ Plan:   Caroline Blake is a 72 y.o. female with PMH significant for recent stroke status post TPA, paroxysmal atrial fibrillation, history of GI bleed secondary to AVMs, hypertension, hypothyroidism, hyperlipidemia, R renal cancer s/p nephrectomy (10 years ago), urethral stricture, who presented with worsening aphasia and weakness.   1. AKI on CKD 3 (baseline in Care Everywhere 1.5-2): UA with >100,000 GNRs, then multiple species. S/p 3d CTX. AKi likely combination of prerenal etiology with poor renal reserve. Renal US without obstruction, however includes hypoechoic nodule in R kidney. -d/c IV bicarb, encourage PO hydration -Daily RFPs -consider d/c in am with outpatient f/u of Cr if patient is able to maintain PO hydration   2. Hypocalcemia, improving: has been present in [place] labs since 07/2016. Likely due to malnutrition or vitamin D deficiency with elevated PTH.  -continue tums 750mg  TID, calcitriol 0.60mcg daily -d/c mag supplement -recheck mag lab  3. Renal nodule: will likely need outpatient urology follow up, question recurrence of RCC. This plan will hinge on palliative consult as well.  4. Anemia: hx of multiple GI bleeds.  5.  Recent CVA per primary and neurology.   Subjective:    Patient oriented only to self this morning, no family at bedside. Denies pain. Full breakfast on tray.   Objective:   BP (!) 173/86 (BP Location: Right Arm)   Pulse 88   Temp 97.7 F (36.5 C) (Axillary)   Resp 20   Ht 5' 4.5" (1.638 m)   Wt 136 lb 6.4 oz (61.9 kg)   SpO2 97%   BMI 23.05 kg/m   Intake/Output Summary (Last 24 hours) at 09/19/2017 0813 Last data filed at 09/18/2017 1908 Gross per 24 hour  Intake 120 ml  Output -  Net 120 ml   Weight change:   Physical Exam:  Gen: Elderly female, NAD CVS: RRR, no m/r/g Resp: coarse breath sounds throughout, comfortable WOB Abd: soft, nontender, nondistended Ext: no  edema, cast on RLE  Imaging: No results found.  Labs: BMET Recent Labs  Lab 09/14/17 1925 09/14/17 1932 09/15/17 0806 09/16/17 0731 09/17/17 0243 09/18/17 0449 09/19/17 0204  NA 140 139 141 142 142 143 145  K 4.0 4.0 3.4* 3.6 3.1* 3.9 4.6  CL 105 105 107 109 106 109 113*  CO2 16*  --  11* 14* 19* 19* 21*  GLUCOSE 137* 134* 124* 102* 114* 100* 122*  BUN 101* 87* 121* 126* 119* 114* 98*  CREATININE 3.77* 3.90* 4.55* 4.75* 4.43* 3.84* 3.30*  CALCIUM 5.8*  --  5.6* 5.6* 6.0* 6.6* 6.9*  PHOS  --   --   --  7.9* 7.1* 5.8* 4.0   CBC Recent Labs  Lab 09/14/17 1925 09/14/17 1932 09/15/17 0806 09/16/17 0807  WBC 15.0*  --  14.3* 14.7*  NEUTROABS 12.8*  --   --   --   HGB 13.8 15.0 13.8 12.5  HCT 43.1 44.0 42.4 39.2  MCV 79.8  --  79.4 79.0  PLT 292  --  294 278    Medications:    . aspirin  300 mg Rectal Daily   Or  . aspirin  325 mg Oral Daily  . calcitRIOL  0.25 mcg Oral Daily  . calcium carbonate  400 mg of elemental calcium Oral TID WC  . enoxaparin (LOVENOX) injection  30 mg Subcutaneous Q24H  . feeding supplement (ENSURE ENLIVE)  237 mL Oral BID BM  . insulin  aspart  0-9 Units Subcutaneous Q4H  . levothyroxine  150 mcg Oral QAC breakfast  . magnesium chloride  2 tablet Oral BID  . multivitamin  15 mL Oral Daily  . potassium chloride  20 mEq Oral BID    Ralene Ok, MD 09/19/17  I have seen and examined this patient and agree with plan and assessment in the above note with renal recommendations/intervention highlighted.  Scr continues to slowly improve.  Plan for discharge to CIR and will continue to follow renal function for now and will need to follow up as an outpatient once stable for discharge to home.  Governor [place] Marliss Buttacavoli,MD 09/19/2017 3:53 PM

## 2017-09-19 NOTE — Progress Notes (Signed)
Plan is for patient to d/c to rehab and then transition home. Pts spouse interested in equipment for home post rehab. CM informed him that usually the rehab facility will help arrange this at d/c from rehab. CM will provide him with orders for DME to assist him post d/c from rehab.

## 2017-09-20 LAB — GLUCOSE, CAPILLARY
Glucose-Capillary: 103 mg/dL — ABNORMAL HIGH (ref 65–99)
Glucose-Capillary: 110 mg/dL — ABNORMAL HIGH (ref 65–99)
Glucose-Capillary: 113 mg/dL — ABNORMAL HIGH (ref 65–99)
Glucose-Capillary: 126 mg/dL — ABNORMAL HIGH (ref 65–99)
Glucose-Capillary: 91 mg/dL (ref 65–99)
Glucose-Capillary: 92 mg/dL (ref 65–99)

## 2017-09-20 LAB — RENAL FUNCTION PANEL
ALBUMIN: 2 g/dL — AB (ref 3.5–5.0)
ANION GAP: 13 (ref 5–15)
BUN: 70 mg/dL — ABNORMAL HIGH (ref 6–20)
CALCIUM: 6.8 mg/dL — AB (ref 8.9–10.3)
CO2: 24 mmol/L (ref 22–32)
Chloride: 111 mmol/L (ref 101–111)
Creatinine, Ser: 2.63 mg/dL — ABNORMAL HIGH (ref 0.44–1.00)
GFR calc non Af Amer: 17 mL/min — ABNORMAL LOW (ref >60)
GFR, EST AFRICAN AMERICAN: 20 mL/min — AB (ref >60)
GLUCOSE: 103 mg/dL — AB (ref 65–99)
PHOSPHORUS: 3.4 mg/dL (ref 2.5–4.6)
Potassium: 4.7 mmol/L (ref 3.5–5.1)
SODIUM: 148 mmol/L — AB (ref 135–145)

## 2017-09-20 MED ORDER — DEXTROSE 5 % IV SOLN
INTRAVENOUS | Status: DC
  Administered 2017-09-20: 13:00:00 via INTRAVENOUS

## 2017-09-20 MED ORDER — METOPROLOL TARTRATE 50 MG PO TABS
50.0000 mg | ORAL_TABLET | Freq: Every day | ORAL | Status: DC
  Administered 2017-09-20 – 2017-09-21 (×2): 50 mg via ORAL
  Filled 2017-09-20 (×2): qty 1

## 2017-09-20 NOTE — Progress Notes (Signed)
[facility] Progress Note    Assessment/ Plan:   Caroline Blake is a 72 y.o. female with PMH significant for recent stroke status post TPA, paroxysmal atrial fibrillation, history of GI bleed secondary to AVMs, hypertension, hypothyroidism, hyperlipidemia, R renal cancer s/p nephrectomy (10 years ago), urethral stricture, who presented with worsening aphasia and weakness.   1. AKI on CKD 3 (baseline in Care Everywhere 1.5-2): UA with >100,000 GNRs, then multiple species. S/p 3d CTX. AKi likely combination of prerenal etiology with poor renal reserve. Renal US without obstruction, however includes hypoechoic nodule in R kidney. Cr trending downward.  -Daily RFPs  2. Hypocalcemia, stable: has been present in [place] labs since 07/2016. Likely due to malnutrition or vitamin D deficiency with elevated PTH.  -continue tums TID, calcitriol 0.14mcg daily  3. Hypernatremia: sodium at 148 today in the setting of poor PO intake and no thirst per patient report.?primary hypodipsia vs recovery phase diuresis.  -poor po intake and free water deficit ~1.6 liters.   Will start hypotonic IVF's and follow sodium level.    4. Renal nodule: will likely need outpatient urology follow up, question recurrence of RCC. This plan will hinge on palliative consult as well.  5. Anemia: hx of multiple GI bleeds.  6.  Recent CVA per primary and neurology.   Subjective:   Patient sitting up in bed with breakfast tray. No family at bedside. States she is not hungry.   Objective:   BP (!) 154/94 (BP Location: Right Arm)   Pulse 95   Temp 97.9 F (36.6 C) (Oral)   Resp 20   Ht 5' 4.5" (1.638 m)   Wt 136 lb 6.4 oz (61.9 kg)   SpO2 97%   BMI 23.05 kg/m   Intake/Output Summary (Last 24 hours) at 09/20/2017 0756 Last data filed at 09/19/2017 1756 Gross per 24 hour  Intake 360 ml  Output -  Net 360 ml   Weight change:   Physical Exam:  Gen: Elderly female, NAD CVS: RRR, no  m/r/g Resp: coarse breath sounds throughout, comfortable WOB Abd: soft, nontender, nondistended Ext: no edema, cast on RLE  Imaging: No results found.  Labs: BMET Recent Labs  Lab 09/14/17 1925 09/14/17 1932 09/15/17 6269 09/16/17 0731 09/17/17 0243 09/18/17 0449 09/19/17 0204 09/20/17 0532  NA 140 139 141 142 142 143 145 148*  K 4.0 4.0 3.4* 3.6 3.1* 3.9 4.6 4.7  CL 105 105 107 109 106 109 113* 111  CO2 16*  --  11* 14* 19* 19* 21* 24  GLUCOSE 137* 134* 124* 102* 114* 100* 122* 103*  BUN 101* 87* 121* 126* 119* 114* 98* 70*  CREATININE 3.77* 3.90* 4.55* 4.75* 4.43* 3.84* 3.30* 2.63*  CALCIUM 5.8*  --  5.6* 5.6* 6.0* 6.6* 6.9* 6.8*  PHOS  --   --   --  7.9* 7.1* 5.8* 4.0 3.4   CBC Recent Labs  Lab 09/14/17 1925 09/14/17 1932 09/15/17 0806 09/16/17 0807  WBC 15.0*  --  14.3* 14.7*  NEUTROABS 12.8*  --   --   --   HGB 13.8 15.0 13.8 12.5  HCT 43.1 44.0 42.4 39.2  MCV 79.8  --  79.4 79.0  PLT 292  --  294 278    Medications:    . aspirin  300 mg Rectal Daily   Or  . aspirin  325 mg Oral Daily  . calcitRIOL  0.25 mcg Oral Daily  . calcium carbonate  400 mg of  elemental calcium Oral TID WC  . enoxaparin (LOVENOX) injection  30 mg Subcutaneous Q24H  . feeding supplement (ENSURE ENLIVE)  237 mL Oral BID BM  . insulin aspart  0-9 Units Subcutaneous Q4H  . levothyroxine  150 mcg Oral QAC breakfast  . liver oil-zinc oxide   Topical QID  . multivitamin  15 mL Oral Daily  . potassium chloride  20 mEq Oral BID    Ralene Ok, MD 09/20/17  I have seen and examined this patient and agree with plan and assessment in the above note with renal recommendations/intervention highlighted.  Still with poor po intake.   Governor Rooks Garris Melhorn,MD 09/20/2017 12:37 PM

## 2017-09-20 NOTE — Progress Notes (Signed)
PROGRESS NOTE    Caroline Blake  QJJ:[id number] DOB: Mar 01, 1946 DOA: 09/14/2017 PCP: Drake Leach, MD   Brief Narrative: Caroline Blake is a 72 y.o. female with history of recent stroke status post TPA, paroxysmal atrial fibrillation, history of GI bleed secondary to AVMs, hypertension, hyperlipidemia. She presented with increased weakness thought to be secondary to stroke, however, MRI was negative. Found to have AKI on CKD and UTI. Nephrology consulted and started on ceftriaxone for UTI; completed course. With tachycardia, metoprolol restarted. Developed hypernatremia.   Assessment & Plan:   Principal Problem:   ARF (acute renal failure) (HCC) Active Problems:   Stroke (cerebrum) (HCC)   Paroxysmal atrial fibrillation (HCC)   History of seizure   Vascular dementia with behavior disturbance   Hypertension   Diabetes mellitus ([place])   AVM (arteriovenous malformation) of colon with hemorrhage   Aphasia   Acute kidney injury on CKD III Anion gap metabolic acidosis Creatinine appears to have peaked. Poor urine output measurements secondary to incontinence. Patient on Bicarb fluids for acidosis. Continues to improve. -Nephrology recommendations  Hypocalcemia Possibly secondary to renal disease. This is chronic from at least early 2018 per chart review. PTH elevated. Vitamin D level low. Improving with supplementation -Nephrology recommendations: Tums, calcitriol, magnesium  History of CVA Initial concern for acute CVA which was ruled out with MRI. Patient with residual aphasia. Right sided weakness improved -Continue aspirin  Multiple right foot & clavicle fractures Non-weight bearing RLE/RUE. Follows with orthopedic surgery per son -Sling RUE and RLE cast  Hypernatremia Likely secondary to poor oral intake. -nephrology recommendations: D5 fluids over next 24 hours.  UTI Preliminary significant for gram negative rods. Final results states multiple species. Completed  course of ceftriaxone.  Hypothyroidism -Continue Synthroid  Paroxysmal atrial fibrillation Currently not on anticoagulation secondary to history of GI bleeding. On review of telemetry, patient with intermittent tachycardia. Infrequent PVCs. Incorrectly labeled pause is a PVC. Patient with intermittent, non-sustained episodes of RVR. EKG yesterday significant for sinus rhythm. EKG today with tachycardia. Difficult to ascertain rhythm. -Restart home metoprolol -Telemetry overnight  History of diabetes mellitus Diet controlled. Last hemoglobin A1C of 6.5%. -Continue SSI  Renal nodule Seen on renal ultrasound. Would require imaging with contrast for further evaluation, which patient cannot tolerate at this time secondary to AKI. Patient with a history of renal cell carcinoma. Outpatient follow-up.    DVT prophylaxis: Lovenox Code Status: DNR Family Communication: None at bedside Disposition Plan: When medically stable, likely back to SNF.   Consultants:   Nephrology  Procedures:   None  Antimicrobials:  Ceftriaxone (2/6>>2/10)   Subjective: No chest pain or dyspnea.  Objective: Vitals:   09/19/17 1805 09/19/17 2200 09/20/17 0200 09/20/17 0500  BP: 138/88 (!) 165/88 (!) 158/82 (!) 154/94  Pulse: 79 87 89 95  Resp: 20 20 18 20   Temp: 98.4 F (36.9 C) 97.8 F (36.6 C) 98.3 F (36.8 C) 97.9 F (36.6 C)  TempSrc: Oral Oral Oral Oral  SpO2: 97% 95% 96% 97%  Weight:      Height:        Intake/Output Summary (Last 24 hours) at 09/20/2017 1127 Last data filed at 09/20/2017 0904 Gross per 24 hour  Intake 600 ml  Output -  Net 600 ml   Filed Weights   09/15/17 0131 09/15/17 1002  Weight: 63.1 kg (139 lb 1.8 oz) 61.9 kg (136 lb 6.4 oz)    Examination:  General exam: Appears calm and comfortable Respiratory system: Clear to  auscultation bilaterally. Unlabored work of breathing. No wheezing or rales. Cardiovascular: Tachycardia, regular rhythm. Normal S1 and S2.  No heart murmurs present. No extra heart sounds Central nervous system: Alert and oriented to person and place. Extremities: No edema. No calf tenderness Skin: Ecchymosis on forearms appears improved    Data Reviewed: I have personally reviewed following labs and imaging studies  CBC: Recent Labs  Lab 09/14/17 1925 09/14/17 1932 09/15/17 0806 09/16/17 0807  WBC 15.0*  --  14.3* 14.7*  NEUTROABS 12.8*  --   --   --   HGB 13.8 15.0 13.8 12.5  HCT 43.1 44.0 42.4 39.2  MCV 79.8  --  79.4 79.0  PLT 292  --  294 101   Basic Metabolic Panel: Recent Labs  Lab 09/16/17 0731 09/17/17 0243 09/18/17 0449 09/19/17 0204 09/20/17 0532  NA 142 142 143 145 148*  K 3.6 3.1* 3.9 4.6 4.7  CL 109 106 109 113* 111  CO2 14* 19* 19* 21* 24  GLUCOSE 102* 114* 100* 122* 103*  BUN 126* 119* 114* 98* 70*  CREATININE 4.75* 4.43* 3.84* 3.30* 2.63*  CALCIUM 5.6* 6.0* 6.6* 6.9* 6.8*  MG  --   --   --  2.5*  --   PHOS 7.9* 7.1* 5.8* 4.0 3.4   GFR: Estimated Creatinine Clearance: 17.6 mL/min (A) (by C-G formula based on SCr of 2.63 mg/dL (H)). Liver Function Tests: Recent Labs  Lab 09/14/17 1925 09/15/17 0806 09/16/17 0731 09/17/17 0243 09/18/17 0449 09/19/17 0204 09/20/17 0532  AST 26 24  --   --   --   --   --   ALT 15 18  --   --   --   --   --   ALKPHOS 178* 173*  --   --   --   --   --   BILITOT 0.5 0.3  --   --   --   --   --   PROT 4.9* 4.7*  --   --   --   --   --   ALBUMIN 2.5* 2.3* 2.0* 2.0* 2.0* 1.9* 2.0*   No results for input(s): LIPASE, AMYLASE in the last 168 hours. No results for input(s): AMMONIA in the last 168 hours. Coagulation Profile: Recent Labs  Lab 09/14/17 1925  INR 1.25   Cardiac Enzymes: Recent Labs  Lab 09/15/17 0806 09/15/17 1306 09/15/17 2029  TROPONINI <0.03 <0.03 <0.03   BNP (last 3 results) No results for input(s): PROBNP in the last 8760 hours. HbA1C: No results for input(s): HGBA1C in the last 72 hours. CBG: Recent Labs  Lab  09/19/17 1951 09/19/17 2356 09/20/17 0427 09/20/17 0734 09/20/17 0808  GLUCAP 94 93 92 91 103*   Lipid Profile: No results for input(s): CHOL, HDL, LDLCALC, TRIG, CHOLHDL, LDLDIRECT in the last 72 hours. Thyroid Function Tests: No results for input(s): TSH, T4TOTAL, FREET4, T3FREE, THYROIDAB in the last 72 hours. Anemia Panel: No results for input(s): VITAMINB12, FOLATE, FERRITIN, TIBC, IRON, RETICCTPCT in the last 72 hours. Sepsis Labs: Recent Labs  Lab 09/15/17 0806 09/15/17 1129  LATICACIDVEN 1.1 1.1    Recent Results (from the past 240 hour(s))  Urine culture     Status: Abnormal   Collection Time: 09/14/17  9:27 PM  Result Value Ref Range Status   Specimen Description URINE, RANDOM  Final   Special Requests   Final    NONE Performed at [facility], [place] [address] [place], [place]  [address]    Culture MULTIPLE SPECIES PRESENT, SUGGEST RECOLLECTION (A)  Final   Report Status 09/17/2017 FINAL  Final         Radiology Studies: No results found.      Scheduled Meds: . aspirin  300 mg Rectal Daily   Or  . aspirin  325 mg Oral Daily  . calcitRIOL  0.25 mcg Oral Daily  . calcium carbonate  400 mg of elemental calcium Oral TID WC  . enoxaparin (LOVENOX) injection  30 mg Subcutaneous Q24H  . feeding supplement (ENSURE ENLIVE)  237 mL Oral BID BM  . insulin aspart  0-9 Units Subcutaneous Q4H  . levothyroxine  150 mcg Oral QAC breakfast  . liver oil-zinc oxide   Topical QID  . metoprolol tartrate  50 mg Oral Daily  . multivitamin  15 mL Oral Daily  . potassium chloride  20 mEq Oral BID   Continuous Infusions: . dextrose 5 % 1,000 mL infusion       LOS: 5 days     Cordelia Poche, MD [facility] 09/20/2017, 11:27 AM Pager: [phone]  If 7PM-7AM, please contact night-coverage [email or web address] Password [facility] 09/20/2017, 11:27 AM

## 2017-09-20 NOTE — Care Management Important Message (Signed)
Important Message  Patient Details  Name: Caroline Blake MRN: [id number] Date of Birth: November 21, 1945   Medicare Important Message Given:  Yes    Orbie Pyo 09/20/2017, 1:11 PM

## 2017-09-20 NOTE — Progress Notes (Signed)
EKG in chart.

## 2017-09-20 NOTE — Progress Notes (Signed)
Orthopedic Tech Progress Note Patient Details:  Caroline Blake [birth year] [id number]  Ortho Devices Type of Ortho Device: Arm sling Ortho Device/Splint Location: rue Ortho Device/Splint Interventions: Application   Post Interventions Patient Tolerated: Well Instructions Provided: Care of device   [name] 09/20/2017, 1:11 PM

## 2017-09-20 NOTE — Progress Notes (Signed)
PT Cancellation Note  Patient Details Name: GLORIANA PILTZ MRN: [id number] DOB: Dec 16, 1945   Cancelled Treatment:    Reason Eval/Treat Not Completed: Other (comment). Pt initially having appropriate conversation with PT, but when PT asked to work with pt, pt quickly began yelling "leave me alone." Will follow-up for PT treatment as schedule permits, pending pt's willingness to participate.   Mabeline Caras, PT, DPT Acute Rehab Services  Pager: [place] 09/20/2017, 8:13 AM

## 2017-09-20 NOTE — Clinical Social Work Note (Signed)
Clinical Social Worker continuing to follow patient and family for support and discharge planning needs.  CSW spoke with patient spouse over the phone who is agreeable with discharge to [facility] and is aware that it is a semi-private room.  CSW left message with facility for update.  Patient spouse requests notification of patient transportation plans at discharge.  CSW remains available for support and to facilitate patient discharge needs once medically stable.  Caroline Blake, [place]

## 2017-09-21 DIAGNOSIS — N179 Acute kidney failure, unspecified: Principal | ICD-10-CM

## 2017-09-21 LAB — RENAL FUNCTION PANEL
ALBUMIN: 2.1 g/dL — AB (ref 3.5–5.0)
Anion gap: 11 (ref 5–15)
BUN: 45 mg/dL — ABNORMAL HIGH (ref 6–20)
CO2: 24 mmol/L (ref 22–32)
CREATININE: 2.18 mg/dL — AB (ref 0.44–1.00)
Calcium: 6.7 mg/dL — ABNORMAL LOW (ref 8.9–10.3)
Chloride: 107 mmol/L (ref 101–111)
GFR calc Af Amer: 25 mL/min — ABNORMAL LOW (ref >60)
GFR calc non Af Amer: 22 mL/min — ABNORMAL LOW (ref >60)
GLUCOSE: 143 mg/dL — AB (ref 65–99)
PHOSPHORUS: 2.8 mg/dL (ref 2.5–4.6)
Potassium: 5.4 mmol/L — ABNORMAL HIGH (ref 3.5–5.1)
Sodium: 142 mmol/L (ref 135–145)

## 2017-09-21 LAB — GLUCOSE, CAPILLARY
GLUCOSE-CAPILLARY: 105 mg/dL — AB (ref 65–99)
Glucose-Capillary: 106 mg/dL — ABNORMAL HIGH (ref 65–99)
Glucose-Capillary: 117 mg/dL — ABNORMAL HIGH (ref 65–99)

## 2017-09-21 LAB — BASIC METABOLIC PANEL
ANION GAP: 10 (ref 5–15)
BUN: 45 mg/dL — AB (ref 6–20)
CHLORIDE: 107 mmol/L (ref 101–111)
CO2: 22 mmol/L (ref 22–32)
Calcium: 6.5 mg/dL — ABNORMAL LOW (ref 8.9–10.3)
Creatinine, Ser: 2.11 mg/dL — ABNORMAL HIGH (ref 0.44–1.00)
GFR calc Af Amer: 26 mL/min — ABNORMAL LOW (ref >60)
GFR, EST NON AFRICAN AMERICAN: 23 mL/min — AB (ref >60)
GLUCOSE: 131 mg/dL — AB (ref 65–99)
POTASSIUM: 5.2 mmol/L — AB (ref 3.5–5.1)
Sodium: 139 mmol/L (ref 135–145)

## 2017-09-21 MED ORDER — SODIUM POLYSTYRENE SULFONATE 15 GM/60ML PO SUSP: 30.0000 g | Freq: Once | ORAL | Status: DC

## 2017-09-21 MED ORDER — CALCITRIOL 0.25 MCG PO CAPS: 0.2500 ug | ORAL_CAPSULE | Freq: Every day | ORAL | 0 refills | Status: DC

## 2017-09-21 MED ORDER — RENA-VITE PO TABS: 1.0000 | ORAL_TABLET | Freq: Every day | ORAL | Status: DC

## 2017-09-21 MED ORDER — CALCIUM CARBONATE ANTACID 500 MG PO CHEW: 400.0000 mg | CHEWABLE_TABLET | Freq: Three times a day (TID) | ORAL | 0 refills | Status: DC

## 2017-09-21 MED ORDER — NEPRO/CARBSTEADY PO LIQD: 237.0000 mL | Freq: Three times a day (TID) | ORAL | 0 refills | Status: DC

## 2017-09-21 MED ORDER — SODIUM POLYSTYRENE SULFONATE PO POWD
30.0000 g | Freq: Once | ORAL | Status: AC
  Administered 2017-09-21: 30 g via ORAL
  Filled 2017-09-21: qty 30

## 2017-09-21 MED ORDER — NEPRO/CARBSTEADY PO LIQD
237.0000 mL | Freq: Three times a day (TID) | ORAL | Status: DC
  Administered 2017-09-21: 237 mL via ORAL
  Filled 2017-09-21 (×5): qty 237

## 2017-09-21 NOTE — Discharge Summary (Signed)
[facility] Discharge Summary   Patient: Caroline Blake   PCP: Drake Leach, MD DOB: 1945-11-24   Date of admission: 09/14/2017   Date of discharge:  09/21/2017    Discharge Diagnoses:  Principal Problem:   ARF (acute renal failure) ([place]) Active Problems:   Stroke (cerebrum) (HCC)   Paroxysmal atrial fibrillation ([place])   History of seizure   Vascular dementia with behavior disturbance   Hypertension   Diabetes mellitus ([place])   AVM (arteriovenous malformation) of colon with hemorrhage   Aphasia   Admitted From: SNF Disposition:  SNF  Recommendations for Outpatient Follow-up:  1. Follow-up with PCP in 1 week, recommend to recheck BMP on 09/23/2017. 2. If patient condition worsen again recommend outpatient palliative care consult.   Contact information for follow-up providers    Drake Leach, MD. Schedule an appointment as soon as possible for a visit in 1 week(s).   Specialty:  Internal Medicine Contact information: [address] [place] [address] [phone]        [place], [place]. Schedule an appointment as soon as possible for a visit in 1 week(s).   Contact information: [place] [address] [phone]            Contact information for after-discharge care    Destination    [facility] Follow up.   Service:  Skilled Nursing Contact information: [place] [phone]                 Diet recommendation: renal diet  Activity: The patient is advised to gradually reintroduce usual activities.  Discharge Condition: good  Code Status: DNR DNI  History of present illness: As per the H and P dictated on admission, " Caroline Blake is a 72 y.o. female with history of recent stroke status post TPA, paroxysmal atrial fibrillation, history of GI bleed secondary to AVMs, hypertension, hyperlipidemia was brought to the ER after patient's family  found that patient was getting increasingly worsening a aphasia from baseline with increasing weakness noticed last evening around 5 PM.  Patient did not have any other new weakness of the extremities.  Patient has had recently fracture of the right lower extremity.  And also has been recently admitted for aspiration pneumonia.  ED Course: In the ER patient had some difficulty speaking and had failed swallow evaluation.  CT of the head was unremarkable.  On-call neurologist was consulted and neurologist recommended MRI brain and if positive for stroke then to call stroke team.  In addition patient's labs show worsening renal function from baseline and patient was placed on IV fluids.  UA shows features concerning for UTI."  Hospital Course:  Summary of her active problems in the hospital is as following. Acute kidney injury on CKD III Anion gap metabolic acidosis Mild hyperkalemia Creatinine appears to have peaked. Poor urine output measurements secondary to incontinence.  She treated with bicarb fluids, repeat later on treated with D5.  Currently renal function getting significantly better BUN also better. Recommend to continue monitoring BMP as an outpatient initial check recommended on Friday. Continue renal diet. Outpatient nephrology follow-up. If there is no significant improvement in patient's renal function or if there is further worsening recommend reconsulting palliative care as an outpatient.  Hypocalcemia Possibly secondary to renal disease. This is chronic from at least early 2018 per chart review. PTH elevated. Vitamin D level low. Improving with supplementation -Nephrology recommendations: Tums, calcitriol, magnesium  History of CVA Initial concern for acute CVA which was ruled out with MRI. Patient with residual aphasia. Right sided weakness improved -Continue aspirin  Multiple right foot & clavicle fractures Non-weight bearing RLE/RUE. Follows with orthopedic surgery per  son -Sling RUE and RLE cast  Hypernatremia Likely secondary to poor oral intake. -nephrology recommendations: D5 fluids over next 24 hours.  UTI Preliminary significant for gram negative rods. Final results states multiple species. Completed course of ceftriaxone.  Hypothyroidism -Continue Synthroid  Paroxysmal atrial fibrillation Currently not on anticoagulation secondary to history of GI bleeding. On review of telemetry, patient with intermittent tachycardia. Infrequent PVCs. Incorrectly labeled pause is a PVC. Patient with intermittent, non-sustained episodes of RVR.  Monitoring renal function as an outpatient home regimen  History of diabetes mellitus Diet controlled. Last hemoglobin A1C of 6.5%.  Renal nodule Seen on renal ultrasound. Would require imaging with contrast for further evaluation, which patient cannot tolerate at this time secondary to AKI. Patient with a history of renal cell carcinoma. Outpatient follow-up.  All other chronic medical condition were stable during the hospitalization.  Patient was seen by physical therapy, who recommended SNF, which was arranged by Education officer, museum and case Freight forwarder. On the day of the discharge the patient's vitals were stable, and no other acute medical condition were reported by patient. the patient was felt safe to be discharge at SNF with tehrapy.  Procedures and Results:  none   Consultations:  neohrology  DISCHARGE MEDICATION: Allergies as of 09/21/2017      Reactions   Atorvastatin Itching, Other (See Comments)   Myopathy also   Carvedilol Itching   Cinoxacin Itching, Other (See Comments)   Hypotension also   Etodolac Other (See Comments), Itching, Hives   Metoclopramide Itching   Moxifloxacin Itching   Yellow Dye Hives   Paroxetine Hcl Other (See Comments)   Unknown reaction   Rosuvastatin Calcium Other (See Comments)   Myalgias   Nitrofurantoin Other (See Comments)   Yeast infection   Rivaroxaban Other  (See Comments)   GI bleed   Sulfa Antibiotics Hives, Itching   Yellow Dyes (non-tartrazine) Other (See Comments)   unspecified   Aspirin-dipyridamole Er Itching   Ezetimibe-simvastatin Itching   Pitavastatin Itching      Medication List    STOP taking these medications   conjugated estrogens vaginal cream Commonly known as:  PREMARIN   mirabegron ER 50 MG Tb24 tablet Commonly known as:  MYRBETRIQ   phenazopyridine 200 MG tablet Commonly known as:  PYRIDIUM   promethazine 25 MG tablet Commonly known as:  PHENERGAN   traMADol 50 MG tablet Commonly known as:  ULTRAM     TAKE these medications   buPROPion 150 MG 12 hr tablet Commonly known as:  WELLBUTRIN SR Take 150 mg by mouth every morning.   calcitRIOL 0.25 MCG capsule Commonly known as:  ROCALTROL Take 1 capsule (0.25 mcg total) by mouth daily. Start taking on:  09/22/2017   calcium carbonate 500 MG chewable tablet Commonly known as:  TUMS - dosed in mg elemental calcium Chew 2 tablets (400 mg of elemental calcium total) by mouth 3 (three) times daily with meals.   clopidogrel 75 MG tablet Commonly known as:  PLAVIX Take 75 mg by mouth daily.   donepezil 10 MG tablet Commonly known as:  ARICEPT Take 5 mg by mouth daily.   ezetimibe 10 MG tablet Commonly known as:  ZETIA Take 10 mg by mouth daily.   feeding supplement (NEPRO CARB STEADY)  Liqd Take 237 mLs by mouth 3 (three) times daily with meals.   FISH OIL PO Take 1 capsule by mouth daily.   insulin aspart protamine- aspart (70-30) 100 UNIT/ML injection Commonly known as:  NOVOLOG MIX 70/30 Inject 3 Units into the skin 2 (two) times daily.   lamoTRIgine 100 MG tablet Commonly known as:  LAMICTAL Take 1 tablet (100 mg total) by mouth 2 (two) times daily. What changed:  Another medication with the same name was removed. Continue taking this medication, and follow the directions you see here.   levothyroxine 150 MCG tablet Commonly known as:   SYNTHROID, LEVOTHROID Take 150 mcg by mouth daily.   metoprolol tartrate 50 MG tablet Commonly known as:  LOPRESSOR Take 50 mg by mouth daily.   metroNIDAZOLE 0.75 % vaginal gel Commonly known as:  METROGEL Place 1 Applicatorful vaginally daily as needed for itching.   pravastatin 80 MG tablet Commonly known as:  PRAVACHOL Take 80 mg by mouth daily.   sertraline 100 MG tablet Commonly known as:  ZOLOFT Take 200 mg by mouth daily.            Durable Medical Equipment  (From admission, onward)        Start     Ordered   09/19/17 1115  For home use only DME Hospital bed  Once    Question:  Bed type  Answer:  Semi-electric   09/19/17 1114   09/19/17 1114  For home use only DME Other see comment  Once    Comments:  Lift chair   09/19/17 1113   09/19/17 1113  For home use only DME 3 n 1  Once     09/19/17 1113     Allergies  Allergen Reactions  . Atorvastatin Itching and Other (See Comments)    Myopathy also   . Carvedilol Itching  . Cinoxacin Itching and Other (See Comments)    Hypotension also  . Etodolac Other (See Comments), Itching and Hives  . Metoclopramide Itching  . Moxifloxacin Itching  . Yellow Dye Hives  . Paroxetine Hcl Other (See Comments)    Unknown reaction  . Rosuvastatin Calcium Other (See Comments)    Myalgias  . Nitrofurantoin Other (See Comments)    Yeast infection  . Rivaroxaban Other (See Comments)    GI bleed  . Sulfa Antibiotics Hives and Itching  . Yellow Dyes (Non-Tartrazine) Other (See Comments)    unspecified  . Aspirin-Dipyridamole Er Itching  . Ezetimibe-Simvastatin Itching  . Pitavastatin Itching   Discharge Instructions    Diet renal [id number]   Complete by:  As directed    Discharge instructions   Complete by:  As directed    It is important that you read following instructions as well as go over your medication list with RN to help you understand your care after this hospitalization.  Discharge  Instructions: Please follow-up with PCP in one week  Please request your primary care physician to go over all Hospital Tests and Procedure/Radiological results at the follow up,  Please get all Hospital records sent to your PCP by signing hospital release before you go home.   Do not take more than prescribed Pain, Sleep and Anxiety Medications. You were cared for by a hospitalist during your hospital stay. If you have any questions about your discharge medications or the care you received while you were in the hospital after you are discharged, you can call the unit and ask to speak with the hospitalist  on call if the hospitalist that took care of you is not available.  Once you are discharged, your primary care physician will handle any further medical issues. Please note that NO REFILLS for any discharge medications will be authorized once you are discharged, as it is imperative that you return to your primary care physician (or establish a relationship with a primary care physician if you do not have one) for your aftercare needs so that they can reassess your need for medications and monitor your lab values. You Must read complete instructions/literature along with all the possible adverse reactions/side effects for all the Medicines you take and that have been prescribed to you. Take any new Medicines after you have completely understood and accept all the possible adverse reactions/side effects. Wear Seat belts while driving. If you have smoked or chewed Tobacco in the last 2 yrs please stop smoking and/or stop any Recreational drug use.   Increase activity slowly   Complete by:  As directed      Discharge Exam: Filed Weights   09/15/17 0131 09/15/17 1002  Weight: 63.1 kg (139 lb 1.8 oz) 61.9 kg (136 lb 6.4 oz)   Vitals:   09/21/17 0847 09/21/17 0900  BP: (!) 153/101 (!) 156/94  Pulse: 92 90  Resp:  16  Temp:  98 F (36.7 C)  SpO2:  98%   General: Appear in no distress, no  Rash; Oral Mucosa moist. Cardiovascular: S1 and S2 Present, no Murmur, no JVD Respiratory: Bilateral Air entry present and Clear to Auscultation, no Crackles, no wheezes Abdomen: Bowel Sound present, Soft and no tenderness Extremities: no Pedal edema, no calf tenderness Neurology: aphasia.   The results of significant diagnostics from this hospitalization (including imaging, microbiology, ancillary and laboratory) are listed below for reference.    Significant Diagnostic Studies: Ct Angio Head W Or Wo Contrast  Result Date: 09/01/2017 CLINICAL DATA:  Code stroke. Weakness and aphasia. Flaccid right side. EXAM: CT ANGIOGRAPHY HEAD AND NECK CT PERFUSION BRAIN TECHNIQUE: Multidetector CT imaging of the head and neck was performed using the standard protocol during bolus administration of intravenous contrast. Multiplanar CT image reconstructions and MIPs were obtained to evaluate the vascular anatomy. Carotid stenosis measurements (when applicable) are obtained utilizing NASCET criteria, using the distal internal carotid diameter as the denominator. Multiphase CT imaging of the brain was performed following IV bolus contrast injection. Subsequent parametric perfusion maps were calculated using RAPID software. CONTRAST:  Reference EMR COMPARISON:  None. FINDINGS: CT HEAD FINDINGS Brain: There is a large remote right MCA territory infarct affecting temporal and posterior frontal lobes. Remote left frontal operculum infarct. Remote perforator infarct with volume loss in the left basal ganglia and deep white matter tracts. Age-indeterminate lacunar infarct in the left thalamus. Chronic small vessel ischemia. No hemorrhage, hydrocephalus, or masslike finding. Vascular: See below. Skull: Negative Sinuses/Orbits: Negative Other: Negative ASPECTS ([place] Stroke Program Early CT Score) Not scored given the degree of chronic change. CTA NECK FINDINGS Aortic arch: Atherosclerotic calcification.  Three vessel  branching. Right carotid system: Moderate bifurcation atheromatous plaque at the common carotid and bifurcation without ulceration or flow limiting stenosis. Negative for beading. Left carotid system: Mild-to-moderate atheromatous plaque at the common carotid bifurcation without flow limiting stenosis or ulceration. Vertebral arteries: No proximal subclavian flow limiting stenosis. Codominant vertebral arteries. No vertebral stenosis or irregularity. Skeleton: Mid right clavicle fracture that is unhealed/nonunited. Other neck: There is a 2 cm ovoid mass with possible polar artery at the central thoracic inlet.  Thyroidectomy. There is thyroid ultrasound in 2015 showing goiter. Upper chest: Centrilobular emphysema. Review of the MIP images confirms the above findings CTA HEAD FINDINGS Anterior circulation: Carotid siphon atherosclerosis without flow limiting stenosis or irregularity. Short segment occlusionof the left distal M1 segment just beyond the anterior temporal branch. Left M2 branches are opacified but appear under filled compared to the right. Atherosclerotic irregularity of right M1 and M2 branches. Negative for aneurysm. Posterior circulation: Vertebral and basilar and widely patent arteries are smooth. Hypoplastic left P1 segment. Moderate to advanced atheromatous narrowing of the distal right P2 segment. Venous sinuses: Patent as permitted by contrast timing. Anatomic variants: As above Delayed phase: Not obtained in the emergent setting Review of the MIP images confirms the above findings CT Brain Perfusion Findings: Perfusion maps have not yet reached PACS at time of signing, but rapid report was reviewed elsewhere. CBF (<30%) Volume: 60mL Perfusion (Tmax>6.0s) volume: 39mL These results were called by telephone at the time of interpretation on 09/01/2017 at 10:56 am to Dr. Amie Portland , who verbally acknowledged these results. IMPRESSION: 1. Distal left M1 short segment occlusion with opacified but  under filled left M2 branches. This occlusion is age-indeterminate. No infarct by cerebral perfusion but there is 18cc penumbra/ischemia. 2. No flow limiting stenosis or ulceration in the neck. The patient is wearing an implanted loop recorder. 3. Multifocal remote MCA distribution infarcts. 4. Age-indeterminate left thalamus lacunar infarct. 5. Moderate to advanced right distal P2 segment stenosis. 6. Aortic Atherosclerosis (ICD10-I70.0) and Emphysema (ICD10-J43.9). 7. Unhealed right mid clavicle fracture with surrounding hemorrhage, favored subacute. 8. 2 cm nodule at the thoracic inlet that is high-density or enhancing. Question polar vessel. 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 Electronically Signed   By: Monte Fantasia M.D.   On: 09/01/2017 11:06   Dg Chest 2 View  Result Date: 09/14/2017 CLINICAL DATA:  Increased slurred speech and altered mental status. EXAM: CHEST  2 VIEW COMPARISON:  09/03/2017. FINDINGS: Normal heart size. Loop recorder. Clear lung fields. Thoracic atherosclerosis. No osseous findings. IMPRESSION: No active cardiopulmonary disease.  Stable exam. Electronically Signed   By: Staci Righter M.D.   On: 09/14/2017 20:22   Dg Clavicle Right  Result Date: 09/02/2017 CLINICAL DATA:  Upper chest bruising EXAM: RIGHT CLAVICLE - 2+ VIEWS COMPARISON:  None. FINDINGS: Midshaft right clavicular fracture is noted with downward displacement of the distal fracture fragment. Degenerative changes of the acromioclavicular joint are seen. The humeral head is well seated. The underlying bony thorax is within normal limits. IMPRESSION: Midshaft right clavicular fracture Electronically Signed   By: Inez Catalina M.D.   On: 09/02/2017 11:15   Dg Ankle Complete Right  Result Date: 09/02/2017 CLINICAL DATA:  Known foot fractures EXAM: RIGHT ANKLE - COMPLETE 3+ VIEW COMPARISON:  08/14/2017 FINDINGS: Casting material limits fine bony detail.  The degree of soft tissue swelling has reduced somewhat in the interval from the prior exam. Calcaneal spurring is again noted. Chronic changes about the talonavicular joint are again seen and stable. The known tarsal and metatarsal fractures are not well appreciated on this exam. IMPRESSION: No acute abnormality noted. Known foot fractures are not well appreciated on this study. Electronically Signed   By: Inez Catalina M.D.   On: 09/02/2017 11:16   Ct Head Wo Contrast  Result Date: 09/14/2017 CLINICAL DATA:  Increased slurred speech and altered mental status EXAM: CT HEAD WITHOUT CONTRAST TECHNIQUE: Contiguous axial images were obtained from  the base of the skull through the vertex without intravenous contrast. COMPARISON:  09/04/2017, 09/02/2017, 09/01/2017, 10/22/2016 FINDINGS: Brain: Multifocal old infarcts involving the right temporal lobe, right parietal lobe, inferior left frontal lobe, and left sub insula and periventricular white matter. Hypodensity within the left posterior insula and medial posterior temporal lobe, corresponding to the infarct demonstrated on MRI 09/04/2016. Left cerebellar infarct again demonstrated. No hemorrhage. No mass. No midline shift. Moderate small vessel ischemic changes of the white matter. Moderate atrophy. Vascular: No hyperdense vessels.  Carotid artery calcification. Skull: No fracture.  Hyperostosis. Sinuses/Orbits: Mild mucosal thickening in the ethmoid sinuses. No acute orbital abnormality. Left lens extraction. Other: None IMPRESSION: 1. Left cerebellar, left posterior insula, and left posteromedial temporal lobe infarcts as noted on recent MRI 09/04/2017; negative for hemorrhage or significant mass effect. 2. Multifocal old bilateral infarcts, atrophy, and small vessel ischemic changes of the white matter. Electronically Signed   By: Donavan Foil M.D.   On: 09/14/2017 20:17   Ct Head Wo Contrast  Result Date: 09/02/2017 CLINICAL DATA:  Altered mental status  EXAM: CT HEAD WITHOUT CONTRAST TECHNIQUE: Contiguous axial images were obtained from the base of the skull through the vertex without intravenous contrast. COMPARISON:  Head CT 09/01/2017 FINDINGS: Brain: There is a new focus of hypoattenuation in the left superior cerebellum consistent with acute to early subacute infarct. Other sites of chronic infarction are unchanged. There is no acute hemorrhage. No midline shift or other significant mass effect. Vascular: No hyperdense vessel or unexpected calcification. Skull: Normal. Negative for fracture or focal lesion. Sinuses/Orbits: No acute finding. Other: None. IMPRESSION: 1. Acute to early subacute infarct of the left cerebellum, within the territory of the superior cerebellar artery. 2. Otherwise unchanged appearance of multiple old infarcts. 3. No acute hemorrhage, herniation or hydrocephalus. Electronically Signed   By: Ulyses Jarred M.D.   On: 09/02/2017 21:04   Ct Head Wo Contrast  Result Date: 09/01/2017 CLINICAL DATA:  Stroke.  Post tPA EXAM: CT HEAD WITHOUT CONTRAST TECHNIQUE: Contiguous axial images were obtained from the base of the skull through the vertex without intravenous contrast. COMPARISON:  CT head and CTA head 09/01/2017 FINDINGS: Brain: Negative for acute hemorrhage Chronic infarct right temporoparietal lobe unchanged. Chronic infarct left inferior frontal lobe unchanged. Chronic infarct in the left internal capsule unchanged. Ventricle size normal. No midline shift CT perfusion abnormality in the left parietal lobe without definite CT findings of acute infarct at this time. Vascular: Intravenous contrast is noted from earlier CTA. Skull: Negative Sinuses/Orbits: Negative Other: None IMPRESSION: No acute hemorrhage following tPA. No change from head CT earlier today. Chronic ischemic changes bilaterally. Electronically Signed   By: Franchot Gallo M.D.   On: 09/01/2017 12:24   Ct Angio Neck W Or Wo Contrast  Result Date:  09/01/2017 CLINICAL DATA:  Code stroke. Weakness and aphasia. Flaccid right side. EXAM: CT ANGIOGRAPHY HEAD AND NECK CT PERFUSION BRAIN TECHNIQUE: Multidetector CT imaging of the head and neck was performed using the standard protocol during bolus administration of intravenous contrast. Multiplanar CT image reconstructions and MIPs were obtained to evaluate the vascular anatomy. Carotid stenosis measurements (when applicable) are obtained utilizing NASCET criteria, using the distal internal carotid diameter as the denominator. Multiphase CT imaging of the brain was performed following IV bolus contrast injection. Subsequent parametric perfusion maps were calculated using RAPID software. CONTRAST:  Reference EMR COMPARISON:  None. FINDINGS: CT HEAD FINDINGS Brain: There is a large remote right MCA territory infarct affecting temporal  and posterior frontal lobes. Remote left frontal operculum infarct. Remote perforator infarct with volume loss in the left basal ganglia and deep white matter tracts. Age-indeterminate lacunar infarct in the left thalamus. Chronic small vessel ischemia. No hemorrhage, hydrocephalus, or masslike finding. Vascular: See below. Skull: Negative Sinuses/Orbits: Negative Other: Negative ASPECTS ([place] Stroke Program Early CT Score) Not scored given the degree of chronic change. CTA NECK FINDINGS Aortic arch: Atherosclerotic calcification.  Three vessel branching. Right carotid system: Moderate bifurcation atheromatous plaque at the common carotid and bifurcation without ulceration or flow limiting stenosis. Negative for beading. Left carotid system: Mild-to-moderate atheromatous plaque at the common carotid bifurcation without flow limiting stenosis or ulceration. Vertebral arteries: No proximal subclavian flow limiting stenosis. Codominant vertebral arteries. No vertebral stenosis or irregularity. Skeleton: Mid right clavicle fracture that is unhealed/nonunited. Other neck: There is a 2 cm  ovoid mass with possible polar artery at the central thoracic inlet. Thyroidectomy. There is thyroid ultrasound in 2015 showing goiter. Upper chest: Centrilobular emphysema. Review of the MIP images confirms the above findings CTA HEAD FINDINGS Anterior circulation: Carotid siphon atherosclerosis without flow limiting stenosis or irregularity. Short segment occlusionof the left distal M1 segment just beyond the anterior temporal branch. Left M2 branches are opacified but appear under filled compared to the right. Atherosclerotic irregularity of right M1 and M2 branches. Negative for aneurysm. Posterior circulation: Vertebral and basilar and widely patent arteries are smooth. Hypoplastic left P1 segment. Moderate to advanced atheromatous narrowing of the distal right P2 segment. Venous sinuses: Patent as permitted by contrast timing. Anatomic variants: As above Delayed phase: Not obtained in the emergent setting Review of the MIP images confirms the above findings CT Brain Perfusion Findings: Perfusion maps have not yet reached PACS at time of signing, but rapid report was reviewed elsewhere. CBF (<30%) Volume: 69mL Perfusion (Tmax>6.0s) volume: 41mL These results were called by telephone at the time of interpretation on 09/01/2017 at 10:56 am to Dr. Amie Portland , who verbally acknowledged these results. IMPRESSION: 1. Distal left M1 short segment occlusion with opacified but under filled left M2 branches. This occlusion is age-indeterminate. No infarct by cerebral perfusion but there is 18cc penumbra/ischemia. 2. No flow limiting stenosis or ulceration in the neck. The patient is wearing an implanted loop recorder. 3. Multifocal remote MCA distribution infarcts. 4. Age-indeterminate left thalamus lacunar infarct. 5. Moderate to advanced right distal P2 segment stenosis. 6. Aortic Atherosclerosis (ICD10-I70.0) and Emphysema (ICD10-J43.9). 7. Unhealed right mid clavicle fracture with surrounding hemorrhage, favored  subacute. 8. 2 cm nodule at the thoracic inlet that is high-density or enhancing. Question polar vessel. 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 Electronically Signed   By: Monte Fantasia M.D.   On: 09/01/2017 11:06   Mr Brain Wo Contrast  Result Date: 09/15/2017 CLINICAL DATA:  Increasing weakness and aphasia. EXAM: MRI HEAD WITHOUT CONTRAST TECHNIQUE: Multiplanar, multiecho pulse sequences of the brain and surrounding structures were obtained without intravenous contrast. COMPARISON:  CT head 09/14/2017. MRI 09/04/2017. FINDINGS: Brain: Involuting restricted diffusion of the previously identified infarcts in the LEFT cerebellum, RIGHT cerebellum, LEFT insula, LEFT posterior frontoparietal cortex and white matter, and LEFT occipital cortex. The LEFT cerebellar area of cortical restricted diffusion is slightly better seen on today's study than priors, but this could be due to slice selection; no change in distribution. No new areas of acute infarction are seen. Chronic bihemispheric infarcts are stable. Atrophy and small vessel disease. Vascular: Flow voids are  maintained throughout the carotid, basilar, and vertebral arteries. Tiny chronic hemorrhage RIGHT thalamus. Slight chronic hemorrhage lines the bed of the old RIGHT hemisphere infarct. Skull and upper cervical spine: Normal marrow signal. Sinuses/Orbits: Negative. Other: None. IMPRESSION: Involuting areas of restricted diffusion consistent with typical evolutionary change for acute into subacute infarction. No hemorrhagic transformation. No new areas of ischemia. Electronically Signed   By: Staci Righter M.D.   On: 09/15/2017 08:20   Mr Brain Wo Contrast  Result Date: 09/04/2017 CLINICAL DATA:  Disorientation.  Speech disturbance.  Dementia. EXAM: MRI HEAD WITHOUT CONTRAST TECHNIQUE: Multiplanar, multiecho pulse sequences of the brain and surrounding structures were obtained without  intravenous contrast. COMPARISON:  CT 09/02/2017.  MRI 12/24/2013. FINDINGS: Brain: The study suffers from motion degradation. There is a punctate acute infarction in the right anterior mid cerebellum. There is a more confluent acute infarction in the left superior cerebellum measuring up to 3 cm in size. Acute infarction affects the left superior cerebellar peduncle. 3 cm region of acute infarction in the deep insula on the left. Few scattered punctate acute infarctions elsewhere in the insula and left parietal lobe. Small acute infarction in the posteromedial left temporal lobe. Chronic small-vessel ischemic changes affect the brainstem and cerebellum. There is old infarction in the right temporal and parietal region with atrophy and encephalomalacia. There chronic small-vessel ischemic changes of the hemispheric white matter. No sign of neoplastic mass lesion, acute hemorrhage, hydrocephalus or extra-axial collection. Vascular: Major vessels at the base of the brain show flow. Skull and upper cervical spine: Negative Sinuses/Orbits: Clear/normal Other: None IMPRESSION: Acute infarctions in the anterior and posterior circulation suggesting embolic disease from the heart or ascending aorta. Acute infarctions are most prominently in the left superior cerebellum and left insula and parietal region with other punctate acute infarctions scattered within the left insula and parietal regions and within the right cerebellum and left posteromedial temporal lobe. Extensive chronic ischemic changes elsewhere throughout the brain including an old infarction in the right insula and temporoparietal region. Electronically Signed   By: Nelson Chimes M.D.   On: 09/04/2017 10:23   US Renal  Result Date: 09/15/2017 CLINICAL DATA:  Post left nephrectomy for RCC. Diabetes. Acute renal insufficiency. EXAM: RENAL / URINARY TRACT ULTRASOUND COMPLETE COMPARISON:  Body CT 08/14/2017 FINDINGS: Right Kidney: Length: 11.2 cm. Mildly  increased cortical echogenicity. There is a benign-appearing 1.2 cm cyst, exophytic off of the lower pole of the left kidney. There is a hypoechoic indeterminate nodule in the midpole region of the right kidney which measures 1.8 x 1.6 x 2.1 cm. There is a benign-appearing cyst in the lateral mid polar cortex of the right kidney measuring 1.8 cm Left Kidney: Surgically absent. Bladder: Appears normal for degree of bladder distention. IMPRESSION: Mildly increased cortical echogenicity of the right kidney, likely due to medical renal disease. 1.8 cm indeterminate hypoechoic nodule in the midpole region of the right kidney. CT or MRI with contrast, renal protocol, may be considered when clinically feasible, if further imaging evaluation is desired. Electronically Signed   By: Fidela Salisbury M.D.   On: 09/15/2017 15:58   Ct Cerebral Perfusion W Contrast  Result Date: 09/01/2017 CLINICAL DATA:  Code stroke. Weakness and aphasia. Flaccid right side. EXAM: CT ANGIOGRAPHY HEAD AND NECK CT PERFUSION BRAIN TECHNIQUE: Multidetector CT imaging of the head and neck was performed using the standard protocol during bolus administration of intravenous contrast. Multiplanar CT image reconstructions and MIPs were obtained to evaluate the vascular anatomy.  Carotid stenosis measurements (when applicable) are obtained utilizing NASCET criteria, using the distal internal carotid diameter as the denominator. Multiphase CT imaging of the brain was performed following IV bolus contrast injection. Subsequent parametric perfusion maps were calculated using RAPID software. CONTRAST:  Reference EMR COMPARISON:  None. FINDINGS: CT HEAD FINDINGS Brain: There is a large remote right MCA territory infarct affecting temporal and posterior frontal lobes. Remote left frontal operculum infarct. Remote perforator infarct with volume loss in the left basal ganglia and deep white matter tracts. Age-indeterminate lacunar infarct in the left  thalamus. Chronic small vessel ischemia. No hemorrhage, hydrocephalus, or masslike finding. Vascular: See below. Skull: Negative Sinuses/Orbits: Negative Other: Negative ASPECTS ([place] Stroke Program Early CT Score) Not scored given the degree of chronic change. CTA NECK FINDINGS Aortic arch: Atherosclerotic calcification.  Three vessel branching. Right carotid system: Moderate bifurcation atheromatous plaque at the common carotid and bifurcation without ulceration or flow limiting stenosis. Negative for beading. Left carotid system: Mild-to-moderate atheromatous plaque at the common carotid bifurcation without flow limiting stenosis or ulceration. Vertebral arteries: No proximal subclavian flow limiting stenosis. Codominant vertebral arteries. No vertebral stenosis or irregularity. Skeleton: Mid right clavicle fracture that is unhealed/nonunited. Other neck: There is a 2 cm ovoid mass with possible polar artery at the central thoracic inlet. Thyroidectomy. There is thyroid ultrasound in 2015 showing goiter. Upper chest: Centrilobular emphysema. Review of the MIP images confirms the above findings CTA HEAD FINDINGS Anterior circulation: Carotid siphon atherosclerosis without flow limiting stenosis or irregularity. Short segment occlusionof the left distal M1 segment just beyond the anterior temporal branch. Left M2 branches are opacified but appear under filled compared to the right. Atherosclerotic irregularity of right M1 and M2 branches. Negative for aneurysm. Posterior circulation: Vertebral and basilar and widely patent arteries are smooth. Hypoplastic left P1 segment. Moderate to advanced atheromatous narrowing of the distal right P2 segment. Venous sinuses: Patent as permitted by contrast timing. Anatomic variants: As above Delayed phase: Not obtained in the emergent setting Review of the MIP images confirms the above findings CT Brain Perfusion Findings: Perfusion maps have not yet reached PACS at time of  signing, but rapid report was reviewed elsewhere. CBF (<30%) Volume: 20mL Perfusion (Tmax>6.0s) volume: 64mL These results were called by telephone at the time of interpretation on 09/01/2017 at 10:56 am to Dr. Amie Portland , who verbally acknowledged these results. IMPRESSION: 1. Distal left M1 short segment occlusion with opacified but under filled left M2 branches. This occlusion is age-indeterminate. No infarct by cerebral perfusion but there is 18cc penumbra/ischemia. 2. No flow limiting stenosis or ulceration in the neck. The patient is wearing an implanted loop recorder. 3. Multifocal remote MCA distribution infarcts. 4. Age-indeterminate left thalamus lacunar infarct. 5. Moderate to advanced right distal P2 segment stenosis. 6. Aortic Atherosclerosis (ICD10-I70.0) and Emphysema (ICD10-J43.9). 7. Unhealed right mid clavicle fracture with surrounding hemorrhage, favored subacute. 8. 2 cm nodule at the thoracic inlet that is high-density or enhancing. Question polar vessel. 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 Electronically Signed   By: Monte Fantasia M.D.   On: 09/01/2017 11:06   Dg Chest Port 1 View  Result Date: 09/03/2017 CLINICAL DATA:  Respiratory failure EXAM: PORTABLE CHEST 1 VIEW COMPARISON:  09/02/2017 FINDINGS: Cardiac shadow is stable. Aortic calcifications are again seen. Loop recorder is again noted. Lungs are well aerated bilaterally. Mild interstitial changes are again identified and stable. No focal infiltrate or sizable effusion is seen. IMPRESSION:  Mild interstitial prominence stable from the previous exam. No focal infiltrate is noted. Electronically Signed   By: Inez Catalina M.D.   On: 09/03/2017 07:36   Dg Chest Port 1 View  Result Date: 09/02/2017 CLINICAL DATA:  COPD.  Hypertension.  History of TIA. EXAM: PORTABLE CHEST 1 VIEW COMPARISON:  Chest x-ray 10/21/2016. FINDINGS: Cardiac monitor device noted. Mild  cardiomegaly. No evidence of pulmonary venous congestion. Mild bilateral interstitial prominence. Mild interstitial edema cannot be excluded. A component of the interstitial changes may be chronic. No pleural effusion or pneumothorax. Surgical clips in the neck. IMPRESSION: Mild cardiomegaly. Mild bilateral interstitial prominence noted. A component these changes may be chronic. Mild interstitial edema cannot be excluded. Electronically Signed   By: Marcello Moores  Register   On: 09/02/2017 11:16   Dg Foot Complete Right  Result Date: 09/02/2017 CLINICAL DATA:  History of prior fracture EXAM: RIGHT FOOT COMPLETE - 3+ VIEW COMPARISON:  08/14/2017 FINDINGS: Casting material is noted in place limiting fine bony detail. The known fractures are not as well appreciated as on recent CT examination. Fracture of the distal aspect of the second metatarsal is well visualized. No new focal abnormality is noted. IMPRESSION: Casting material limits fine bony detail and known tarsal and metatarsal fractures are not as well appreciated on this exam. No new focal abnormality is seen. Electronically Signed   By: Inez Catalina M.D.   On: 09/02/2017 11:13   Ct Head Code Stroke Wo Contrast  Result Date: 09/01/2017 CLINICAL DATA:  Code stroke. Weakness and aphasia. Flaccid right side. EXAM: CT ANGIOGRAPHY HEAD AND NECK CT PERFUSION BRAIN TECHNIQUE: Multidetector CT imaging of the head and neck was performed using the standard protocol during bolus administration of intravenous contrast. Multiplanar CT image reconstructions and MIPs were obtained to evaluate the vascular anatomy. Carotid stenosis measurements (when applicable) are obtained utilizing NASCET criteria, using the distal internal carotid diameter as the denominator. Multiphase CT imaging of the brain was performed following IV bolus contrast injection. Subsequent parametric perfusion maps were calculated using RAPID software. CONTRAST:  Reference EMR COMPARISON:  None.  FINDINGS: CT HEAD FINDINGS Brain: There is a large remote right MCA territory infarct affecting temporal and posterior frontal lobes. Remote left frontal operculum infarct. Remote perforator infarct with volume loss in the left basal ganglia and deep white matter tracts. Age-indeterminate lacunar infarct in the left thalamus. Chronic small vessel ischemia. No hemorrhage, hydrocephalus, or masslike finding. Vascular: See below. Skull: Negative Sinuses/Orbits: Negative Other: Negative ASPECTS ([place] Stroke Program Early CT Score) Not scored given the degree of chronic change. CTA NECK FINDINGS Aortic arch: Atherosclerotic calcification.  Three vessel branching. Right carotid system: Moderate bifurcation atheromatous plaque at the common carotid and bifurcation without ulceration or flow limiting stenosis. Negative for beading. Left carotid system: Mild-to-moderate atheromatous plaque at the common carotid bifurcation without flow limiting stenosis or ulceration. Vertebral arteries: No proximal subclavian flow limiting stenosis. Codominant vertebral arteries. No vertebral stenosis or irregularity. Skeleton: Mid right clavicle fracture that is unhealed/nonunited. Other neck: There is a 2 cm ovoid mass with possible polar artery at the central thoracic inlet. Thyroidectomy. There is thyroid ultrasound in 2015 showing goiter. Upper chest: Centrilobular emphysema. Review of the MIP images confirms the above findings CTA HEAD FINDINGS Anterior circulation: Carotid siphon atherosclerosis without flow limiting stenosis or irregularity. Short segment occlusionof the left distal M1 segment just beyond the anterior temporal branch. Left M2 branches are opacified but appear under filled compared to the right. Atherosclerotic irregularity of  right M1 and M2 branches. Negative for aneurysm. Posterior circulation: Vertebral and basilar and widely patent arteries are smooth. Hypoplastic left P1 segment. Moderate to advanced  atheromatous narrowing of the distal right P2 segment. Venous sinuses: Patent as permitted by contrast timing. Anatomic variants: As above Delayed phase: Not obtained in the emergent setting Review of the MIP images confirms the above findings CT Brain Perfusion Findings: Perfusion maps have not yet reached PACS at time of signing, but rapid report was reviewed elsewhere. CBF (<30%) Volume: 52mL Perfusion (Tmax>6.0s) volume: 42mL These results were called by telephone at the time of interpretation on 09/01/2017 at 10:56 am to Dr. Amie Portland , who verbally acknowledged these results. IMPRESSION: 1. Distal left M1 short segment occlusion with opacified but under filled left M2 branches. This occlusion is age-indeterminate. No infarct by cerebral perfusion but there is 18cc penumbra/ischemia. 2. No flow limiting stenosis or ulceration in the neck. The patient is wearing an implanted loop recorder. 3. Multifocal remote MCA distribution infarcts. 4. Age-indeterminate left thalamus lacunar infarct. 5. Moderate to advanced right distal P2 segment stenosis. 6. Aortic Atherosclerosis (ICD10-I70.0) and Emphysema (ICD10-J43.9). 7. Unhealed right mid clavicle fracture with surrounding hemorrhage, favored subacute. 8. 2 cm nodule at the thoracic inlet that is high-density or enhancing. Question polar vessel. 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 Electronically Signed   By: Monte Fantasia M.D.   On: 09/01/2017 11:06    Microbiology: Recent Results (from the past 240 hour(s))  Urine culture     Status: Abnormal   Collection Time: 09/14/17  9:27 PM  Result Value Ref Range Status   Specimen Description URINE, RANDOM  Final   Special Requests   Final    NONE Performed at [facility], [place] [address] [place], [place] [address]    Culture MULTIPLE SPECIES PRESENT, SUGGEST RECOLLECTION (A)  Final   Report Status 09/17/2017 FINAL  Final      Labs: CBC: Recent Labs  Lab 09/14/17 1925 09/14/17 1932 09/15/17 0806 09/16/17 0807  WBC 15.0*  --  14.3* 14.7*  NEUTROABS 12.8*  --   --   --   HGB 13.8 15.0 13.8 12.5  HCT 43.1 44.0 42.4 39.2  MCV 79.8  --  79.4 79.0  PLT 292  --  294 841   Basic Metabolic Panel: Recent Labs  Lab 09/17/17 0243 09/18/17 0449 09/19/17 0204 09/20/17 0532 09/21/17 0952 09/21/17 1136  NA 142 143 145 148* 142 139  K 3.1* 3.9 4.6 4.7 5.4* 5.2*  CL 106 109 113* 111 107 107  CO2 19* 19* 21* 24 24 22   GLUCOSE 114* 100* 122* 103* 143* 131*  BUN 119* 114* 98* 70* 45* 45*  CREATININE 4.43* 3.84* 3.30* 2.63* 2.18* 2.11*  CALCIUM 6.0* 6.6* 6.9* 6.8* 6.7* 6.5*  MG  --   --  2.5*  --   --   --   PHOS 7.1* 5.8* 4.0 3.4 2.8  --    Liver Function Tests: Recent Labs  Lab 09/14/17 1925 09/15/17 0806  09/17/17 0243 09/18/17 0449 09/19/17 0204 09/20/17 0532 09/21/17 0952  AST 26 24  --   --   --   --   --   --   ALT 15 18  --   --   --   --   --   --   ALKPHOS 178* 173*  --   --   --   --   --   --  BILITOT 0.5 0.3  --   --   --   --   --   --   PROT 4.9* 4.7*  --   --   --   --   --   --   ALBUMIN 2.5* 2.3*   < > 2.0* 2.0* 1.9* 2.0* 2.1*   < > = values in this interval not displayed.   No results for input(s): LIPASE, AMYLASE in the last 168 hours. No results for input(s): AMMONIA in the last 168 hours. Cardiac Enzymes: Recent Labs  Lab 09/15/17 0806 09/15/17 1306 09/15/17 2029  TROPONINI <0.03 <0.03 <0.03   BNP (last 3 results) No results for input(s): BNP in the last 8760 hours. CBG: Recent Labs  Lab 09/20/17 1608 09/20/17 2353 09/21/17 0433 09/21/17 0740 09/21/17 1237  GLUCAP 126* 110* 106* 105* 117*   Time spent: 35 minutes  Signed:  Berle Mull  [facility]  09/21/2017  , 2:14 PM

## 2017-09-21 NOTE — Progress Notes (Signed)
Caroline Blake Progress Note    Assessment/ Plan:   Caroline Blake is a 72 y.o. female with PMH significant for recent stroke status post TPA, paroxysmal atrial fibrillation, history of GI bleed secondary to AVMs, hypertension, hypothyroidism, hyperlipidemia, R renal cancer s/p nephrectomy (10 years ago), urethral stricture, who presented with worsening aphasia and weakness.   1. AKI on CKD 3 (baseline in Care Everywhere 1.5-2): UA with >100,000 GNRs, then multiple species. S/p 3d CTX. AKi likely combination of prerenal etiology with poor renal reserve. Renal US without obstruction, however includes hypoechoic nodule in R kidney. Cr trending downward.  -Daily RFPs  2. Hypocalcemia, stable: has been present in [name] labs since 07/2016. Likely due to malnutrition or vitamin D deficiency with elevated PTH.  -continue tums TID, calcitriol 0.81mcg daily  3. Hypernatremia: sodium at 148 today in the setting of poor PO intake and no thirst per patient report. Poor po intake and free water deficit ~1.6 liters.   Will start hypotonic IVF's and follow sodium level.  Of note, no fluids running from 7am-9am 2/2 bag empty, RN to restart.  -awaiting labs this am  4. Renal nodule: will likely need outpatient urology follow up, question recurrence of RCC. This plan will hinge on palliative consult as well.  5. Anemia: hx of multiple GI bleeds.  6.  Recent CVA per primary and neurology.   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 States she is drinking.  Awaiting labs, I called and discussed with phlebotomy who reports they drew labs this am.   Objective:   BP (!) 158/94 (BP Location: Left Arm) Comment: [id number]  Pulse 69   Temp 98 F (36.7 C) (Oral)   Resp 16   Ht 5' 4.5" (1.638 m)   Wt 136 lb 6.4 oz (61.9 kg)   SpO2 97%   BMI 23.05 kg/m   Intake/Output Summary (Last 24 hours) at 09/21/2017 0759 Last data filed at 09/21/2017 0210 Gross per 24 hour  Intake 564.17 ml   Output -  Net 564.17 ml   Weight change:   Physical Exam:  Gen: Elderly female, NAD CVS: RRR, no m/r/g Resp: coarse breath sounds throughout, comfortable WOB Abd: soft, nontender, nondistended Ext: no edema, cast on RLE  Imaging: No results found.  Labs: BMET Recent Labs  Lab 09/15/17 0806 09/16/17 0731 09/17/17 0243 09/18/17 0449 09/19/17 0204 09/20/17 0532 09/21/17 0952  NA 141 142 142 143 145 148* 142  K 3.4* 3.6 3.1* 3.9 4.6 4.7 5.4*  CL 107 109 106 109 113* 111 107  CO2 11* 14* 19* 19* 21* 24 24  GLUCOSE 124* 102* 114* 100* 122* 103* 143*  BUN 121* 126* 119* 114* 98* 70* 45*  CREATININE 4.55* 4.75* 4.43* 3.84* 3.30* 2.63* 2.18*  CALCIUM 5.6* 5.6* 6.0* 6.6* 6.9* 6.8* 6.7*  PHOS  --  7.9* 7.1* 5.8* 4.0 3.4 2.8   CBC Recent Labs  Lab 09/14/17 1925 09/14/17 1932 09/15/17 0806 09/16/17 0807  WBC 15.0*  --  14.3* 14.7*  NEUTROABS 12.8*  --   --   --   HGB 13.8 15.0 13.8 12.5  HCT 43.1 44.0 42.4 39.2  MCV 79.8  --  79.4 79.0  PLT 292  --  294 278    Medications:    . aspirin  300 mg Rectal Daily   Or  . aspirin  325 mg Oral Daily  . calcitRIOL  0.25 mcg Oral Daily  . calcium carbonate  400 mg  of elemental calcium Oral TID WC  . enoxaparin (LOVENOX) injection  30 mg Subcutaneous Q24H  . feeding supplement (ENSURE ENLIVE)  237 mL Oral BID BM  . insulin aspart  0-9 Units Subcutaneous Q4H  . levothyroxine  150 mcg Oral QAC breakfast  . liver oil-zinc oxide   Topical QID  . metoprolol tartrate  50 mg Oral Daily  . multivitamin  15 mL Oral Daily    Ralene Ok, MD 09/21/17  I have seen and examined this patient and agree with plan and assessment in the above note with renal recommendations/intervention highlighted.  Will stop ensure and change to nepro.  Recheck potassium level and stressed importance of adequate po intake.  BUN/Scr has significantly improved and if she is to be discharged to SNF they will need to follow Na, K, and renal function  closely.  Follow up with our office if her renal function does not return to baseline.  Caroline Rooks Dhani Imel,MD 09/21/2017 12:34 PM

## 2017-09-21 NOTE — Progress Notes (Signed)
Nutrition Follow-up  DOCUMENTATION CODES:   Not applicable  INTERVENTION:  Continue Nepro Shake po BID, each supplement provides 425 kcal and 19 grams protein  Monitor GOC vs improvement of PO intake vs need for nutrition support, only completing 26% of meals at this time.  NUTRITION DIAGNOSIS:   Inadequate oral intake related to acute illness as evidenced by meal completion < 50%. -ongoing  GOAL:   Patient will meet greater than or equal to 90% of their needs -not meeting  MONITOR:   PO intake, Supplement acceptance, Labs, Weight trends, I & O's, Skin  ASSESSMENT:   72 year old female presenting with worsening aphasia following L-MCA infarct s/p tPA on 09/01/17 with residual aphasia.  PMH significant for A. fib on Plavix, COPD, tobacco use disorder, HTN, AVM with history of GI bleed, T2DM, OSA.  Attempted to speak with patient at bedside, but she's confused. Per RN, she ate peaches, apple sauce, and drank a little bit of her ensure this morning. Lunch given to her.   Meal Completion: 26%  Labs reviewed:  K+ 5.2 BUN/Creatinine 45/2.11 Medications reviewed and include:  Tums, Insulin, MVI liquid,  Diet Order:  Fall precautions Diet renal with fluid restriction Fluid restriction: 1200 mL Fluid; Room service appropriate? Yes; Fluid consistency: Thin  EDUCATION NEEDS:   Not appropriate for education at this time  Skin:  Skin Assessment: Reviewed RN Assessment  Last BM:  09/20/2017  Height:   Ht Readings from Last 1 Encounters:  09/15/17 5' 4.5" (1.638 m)    Weight:   Wt Readings from Last 1 Encounters:  09/15/17 136 lb 6.4 oz (61.9 kg)    Ideal Body Weight:  55.6 kg  BMI:  Body mass index is 23.05 kg/m.  Estimated Nutritional Needs:   Kcal:  1500-1700kcal/day  Protein:  68-80g/day   Fluid:  >1.5L/day   Satira Anis. Ilianna Bown, MS, RD LDN Inpatient Clinical Dietitian Pager [phone]

## 2017-09-21 NOTE — Progress Notes (Signed)
Report called to [facility] [facility] . Report given to [facility]. Patient and family were updated.

## 2017-09-21 NOTE — Progress Notes (Signed)
Discharge to: [place] Anticipated discharge date: 09/21/17 Family notified: Faustino Congress, by phone Transportation by: [facility]  Report #: [phone]  [facility] signing off.  Laveda Abbe LCSW [phone]

## 2017-09-23 ENCOUNTER — Encounter: Payer: Self-pay | Admitting: Internal Medicine

## 2017-09-23 ENCOUNTER — Non-Acute Institutional Stay (SKILLED_NURSING_FACILITY): Payer: Medicare Other | Admitting: Internal Medicine

## 2017-09-23 DIAGNOSIS — E872 Acidosis, unspecified: Secondary | ICD-10-CM

## 2017-09-23 DIAGNOSIS — E876 Hypokalemia: Secondary | ICD-10-CM | POA: Diagnosis not present

## 2017-09-23 DIAGNOSIS — S42001D Fracture of unspecified part of right clavicle, subsequent encounter for fracture with routine healing: Secondary | ICD-10-CM

## 2017-09-23 DIAGNOSIS — Z8673 Personal history of transient ischemic attack (TIA), and cerebral infarction without residual deficits: Secondary | ICD-10-CM

## 2017-09-23 DIAGNOSIS — Z794 Long term (current) use of insulin: Secondary | ICD-10-CM

## 2017-09-23 DIAGNOSIS — E870 Hyperosmolality and hypernatremia: Secondary | ICD-10-CM

## 2017-09-23 DIAGNOSIS — S92909G Unspecified fracture of unspecified foot, subsequent encounter for fracture with delayed healing: Secondary | ICD-10-CM

## 2017-09-23 DIAGNOSIS — N179 Acute kidney failure, unspecified: Secondary | ICD-10-CM | POA: Diagnosis not present

## 2017-09-23 DIAGNOSIS — I480 Paroxysmal atrial fibrillation: Secondary | ICD-10-CM | POA: Diagnosis not present

## 2017-09-23 DIAGNOSIS — E1122 Type 2 diabetes mellitus with diabetic chronic kidney disease: Secondary | ICD-10-CM

## 2017-09-23 DIAGNOSIS — E890 Postprocedural hypothyroidism: Secondary | ICD-10-CM | POA: Diagnosis not present

## 2017-09-23 DIAGNOSIS — N3000 Acute cystitis without hematuria: Secondary | ICD-10-CM

## 2017-09-23 DIAGNOSIS — Z87898 Personal history of other specified conditions: Secondary | ICD-10-CM

## 2017-09-23 DIAGNOSIS — N183 Chronic kidney disease, stage 3 (moderate): Secondary | ICD-10-CM

## 2017-09-23 NOTE — Progress Notes (Signed)
: Provider:  Hennie Duos., MD Location:  [place] Room Number: 425-W Place of Service:  SNF ([phone])  PCP: Drake Leach, MD Patient Care Team: Drake Leach, MD as PCP - General (Internal Medicine)  Extended Emergency Contact Information Primary Emergency Contact: Etta Quill Address: [place], [place] [address] Johnnette Litter of [place] Phone: [phone] Relation: Spouse Secondary Emergency Contact: Aliayah, [place] Mobile Phone: [phone] Relation: Son     Allergies: Atorvastatin; Carvedilol; Cinoxacin; Etodolac; Metoclopramide; Moxifloxacin; Yellow dye; Paroxetine hcl; Rosuvastatin calcium; Nitrofurantoin; Rivaroxaban; Sulfa antibiotics; Yellow dyes (non-tartrazine); Aspirin-dipyridamole er; Ezetimibe-simvastatin; and Pitavastatin  Chief Complaint  Patient presents with  . Readmit To SNF    Readmission to [facility] SN[facility]    HPI: Patient is 72 y.o. female with history of recent stroke status post TPA, paroxysmal atrial fib, history of GI bleed secondary to AVMs, hypertension, hyperlipidemia, who is brought to the ER after patient's family found the patient was getting increasingly worse and aphasia noted since evening prior to admission at 5 PM. Patient did not have any other weakness of extremities patient has a recent fracture right lower extremity and a recent admission for aspiration pneumonia. Patient was admitted to [facility] from 2/6-13 where MRI ruled out acute CVA, patient with residual aphasia and right-sided weakness which improved. Patient problem of her hospitalization was acute kidney injury on chronic kidney disease stage III with anion gap metabolic acidosis and mild hyperkalemia all of which improved with bicarbonate fluids with D5, and hypocalcemia treated with supplementation in-hospital and ongoing. Patient is admitted to skilled nursing facility for OT/PT. While at skilled nursing facility  patient will be followed for hypothyroidism treated with Synthroid, paroxysmal atrial fib treated with metoprolol and Plavix and seizures treated with Lamictal.  Past Medical History:  Diagnosis Date  . Anemia   . Arthritis of ankle or foot, degenerative 04/30/2016  . Atrial fibrillation ([place]) 09/2016   with left atrial appendage, failed xarelto due to GIB, unable to have watchman due to nickel allergy and Coumadin never started  . AVM (arteriovenous malformation) of colon with hemorrhage 2018   requiring transfusion --- last one was March 2018 - HgB on 10/22/2016 was 7.3 and on 10/23/2016 was 8.7  . Bacteriuria with pyuria 08/14/2015  . Benign hypertension 1995  . Cancer of kidney ([place])   . Cholelithiasis 2008  . Chronic conjunctivitis 07/04/2012  . Chronic kidney disease   . Clostridium difficile colitis 2017  . Cognitive impairment   . Colon polyp 2005   and 11/16/2016  . COPD (chronic obstructive pulmonary disease) ([place])    and currenlty smokes--- refuses to use HS home O2  . Coronary artery disease 2002  . CVA (cerebral vascular accident) ([place])   . Cystitis    INTESTINAL CHRONIC  . DDD (degenerative disc disease), cervical   . Depression   . Diabetes mellitus ([place]) 1999   HGBA1C on 09/09/2016 was 6.8  . Disease of thyroid gland   . History of kidney cancer    LEFT  . History of pancreatitis   . History of transfusion 10/2016  . Hyperlipidemia   . Hypertension   . Hypothyroidism   . Iron deficiency anemia 09/27/2016  . Left homonymous hemianopsia 07/04/2012  . Lisfranc dislocation, right, initial encounter 08/14/2017  . Memory impairment 09/09/2016  . Nuclear sclerosis of left eye 12/09/2016   added automatically from request for surgery  [id number]  . OSA (obstructive sleep apnea)    o2 3L [place] PRN  . Pancreatitis 2008  . Posterior vitreous detachment 07/04/2012  . Postsurgical hypothyroidism    S/P partial thyroidectomy 1978, later went for additional parital thyroidectomy    . Renal cell adenocarcinoma, left ([place]) 2005   [place]  . Retinal drusen 07/04/2012  . Shortness of breath   . Sinus tarsitis of left foot 09/15/2016  . Snores 2005   does not use CPAP regularly  . Stroke [facility]) 2011   LLE sensory, 2012 x2 and 2013 x1; [place]  . TIA (transient ischemic attack) 10/21/2016  . Type 2 diabetes mellitus ([place])   . Type 2 diabetes mellitus with stage 3 chronic kidney disease, with long-term current use of insulin ([place])    on 10/23/2016 Scr was 1.89, GFR 26 and K+ 4.2    Past Surgical History:  Procedure Laterality Date  . CATARACT EXTRACTION    . CATARACT EXTRACTION W/ INTRAOCULAR LENS IMPLANT Left 01/19/2017   Procedure: PHACOEMULSIFICATION PC / IOL TOPICAL; Surgeon: Sonia Side Burden, MD; Location: [place]; Service: Ophthalmology; Laterality: Left;  . CHOLECYSTECTOMY    . COLONOSCOPY Left 09/22/2016   Procedure: COLONOSCOPY, FLEXIBLE, PROXIMAL TO SPLENIC FLEXURE; DIAGNOSTIC, W/WO COLLECTION SPECIMEN BY BRUSH OR [place]; Surgeon: Ned Card, MD; Location: Endo Procedures [facility]; Service: Gastroenterology  . COLONOSCOPY  11/16/2016   Procedure: COLONOSCOPY, FLEXIBLE, PROXIMAL TO SPLENIC FLEXURE; DIAGNOSTIC, W/WO COLLECTION SPECIMEN BY BRUSH OR [place]; Surgeon: Ned Card, MD; Location: Endo Procedures [facility]; Service: Gastroenterology  . COLONOSCOPY W/ BIOPSIES  10/07/2016   Procedure: COLONOSCOPY, FLEXIBLE, PROXIMAL TO SPLENIC FLEXURE; WITH BIOPSY, SINGLE OR MULTIPLE; Surgeon: Bing Quarry, MD; Location: Endo Procedures [facility]; Service: Gastroenterology  . CORONARY STENT PLACEMENT  2002  . GALLBLADDER SURGERY  2008  . LOOP RECORDER IMPLANT  2018   medtronic reveal linq  . NEPHRECTOMY Left 2005   renal cancer   . THYROIDECTOMY, PARTIAL  1978   x2  . TOTAL ABDOMINAL HYSTERECTOMY W/ BILATERAL SALPINGOOPHORECTOMY  1984  . TUBAL LIGATION  1980  . UPPER GASTROINTESTINAL ENDOSCOPY Left 09/22/2016   Procedure: UGI ENDO, INCLUDE ESOPHAGUS,  STOMACH, & DUODENUM &/OR JEJUNUM; DX W/WO COLLECTION SPECIMN, BY BRUSH OR [place]; Surgeon: Ned Card, MD; Location: Endo Procedures [facility]; Service: Gastroenterology    Allergies as of 09/23/2017      Reactions   Atorvastatin Itching, Other (See Comments)   Myopathy also   Carvedilol Itching   Cinoxacin Itching, Other (See Comments)   Hypotension also   Etodolac Other (See Comments), Itching, Hives   Metoclopramide Itching   Moxifloxacin Itching   Yellow Dye Hives   Paroxetine Hcl Other (See Comments)   Unknown reaction   Rosuvastatin Calcium Other (See Comments)   Myalgias   Nitrofurantoin Other (See Comments)   Yeast infection   Rivaroxaban Other (See Comments)   GI bleed   Sulfa Antibiotics Hives, Itching   Yellow Dyes (non-tartrazine) Other (See Comments)   unspecified   Aspirin-dipyridamole Er Itching   Ezetimibe-simvastatin Itching   Pitavastatin Itching      Medication List        Accurate as of 09/23/17  4:41 PM. Always use your most recent med list.          buPROPion 150 MG 12 hr tablet Commonly known as:  WELLBUTRIN SR Take 150 mg by mouth every morning.   calcitRIOL 0.25 MCG capsule Commonly known as:  ROCALTROL Take 1 capsule (0.25 mcg total) by  mouth daily.   calcium carbonate 500 MG chewable tablet Commonly known as:  TUMS - dosed in mg elemental calcium Chew 2 tablets (400 mg of elemental calcium total) by mouth 3 (three) times daily with meals.   clopidogrel 75 MG tablet Commonly known as:  PLAVIX Take 75 mg by mouth daily.   donepezil 10 MG tablet Commonly known as:  ARICEPT Take 5 mg by mouth daily.   ezetimibe 10 MG tablet Commonly known as:  ZETIA Take 10 mg by mouth daily.   feeding supplement (NEPRO CARB STEADY) Liqd Take 237 mLs by mouth 3 (three) times daily with meals.   FISH OIL PO Take 1 capsule by mouth daily.   insulin aspart protamine- aspart (70-30) 100 UNIT/ML injection Commonly known as:  NOVOLOG MIX  70/30 Inject 3 Units into the skin 2 (two) times daily.   lamoTRIgine 100 MG tablet Commonly known as:  LAMICTAL Take 1 tablet (100 mg total) by mouth 2 (two) times daily.   levothyroxine 150 MCG tablet Commonly known as:  SYNTHROID, LEVOTHROID Take 150 mcg by mouth daily.   metoprolol tartrate 50 MG tablet Commonly known as:  LOPRESSOR Take 50 mg by mouth daily.   metroNIDAZOLE 0.75 % vaginal gel Commonly known as:  METROGEL Place 1 Applicatorful vaginally daily as needed for itching.   pravastatin 80 MG tablet Commonly known as:  PRAVACHOL Take 80 mg by mouth daily.   sertraline 100 MG tablet Commonly known as:  ZOLOFT Take 200 mg by mouth daily.       No orders of the defined types were placed in this encounter.   Immunization History  Administered Date(s) Administered  . Influenza, High Dose Seasonal PF 05/21/2015, 06/14/2017  . Influenza-Unspecified 06/19/2016  . Pneumococcal Conjugate-13 05/21/2015  . Pneumococcal Polysaccharide-23 06/09/2004  . Td 11/08/2009  . Zoster 03/04/2011    Social History   Tobacco Use  . Smoking status: Former Smoker    Packs/day: 1.00    Years: 50.00    Pack years: 50.00    Types: Cigarettes    Last attempt to quit: 10/21/2016    Years since quitting: 0.9  . Smokeless tobacco: Never Used  Substance Use Topics  . Alcohol use: No    Frequency: Never    Family history is   Family History  Problem Relation Age of Onset  . Hyperlipidemia Mother   . Lung cancer Mother   . Aneurysm Mother        abdominal aortic  . Anuerysm Father   . Heart disease Father   . Hyperlipidemia Father   . Stroke Father   . Tremor Father        familial essential  . Hypertension Sister   . Cancer Daughter        Peritoneal carcinomatosis, surviving  . Ovarian cancer Daughter   . Aneurysm Brother        abdominal aortic  . Anesthesia problems Neg Hx   . Arthritis Neg Hx   . Asthma Neg Hx   . Cerebral palsy Neg Hx   . Clotting  disorder Neg Hx   . Deep vein thrombosis Neg Hx   . Club foot Neg Hx   . Collagen disease Neg Hx   . Diabetes Neg Hx   . Gait disorder Neg Hx   . Gout Neg Hx   . Hip dysplasia Neg Hx   . Hip fracture Neg Hx   . Hypermobility Neg Hx   . Osteoporosis Neg Hx   .  Pulmonary embolism Neg Hx   . Scoliosis Neg Hx   . Spina bifida Neg Hx   . Thyroid disease Neg Hx   . Vasculitis Neg Hx   . Rheumatologic disease Neg Hx       Review of Systems  DATA OBTAINED: from patient, nurse GENERAL:  no fevers, fatigue, appetite changes SKIN: No itching, or rash EYES: No eye pain, redness, discharge EARS: No earache, tinnitus, change in hearing NOSE: No congestion, drainage or bleeding  MOUTH/THROAT: No mouth or tooth pain, No sore throat RESPIRATORY: No cough, wheezing, SOB CARDIAC: No chest pain, palpitations, lower extremity edema  GI: No abdominal pain, No N/V/D or constipation, No heartburn or reflux  GU: No dysuria, frequency or urgency, or incontinence  MUSCULOSKELETAL: No unrelieved bone/joint pain NEUROLOGIC: No headache, dizziness or focal weakness PSYCHIATRIC: No c/o anxiety or sadness   Vitals:   09/23/17 1628  BP: (!) 142/85  Pulse: 77  Resp: 18  Temp: 98 F (36.7 C)  SpO2: 94%    SpO2 Readings from Last 1 Encounters:  09/23/17 94%   Body mass index is 23.05 kg/m.     Physical Exam  GENERAL APPEARANCE: Alert, conversant,  No acute distress.  SKIN: No diaphoresis rash HEAD: Normocephalic, atraumatic  EYES: Conjunctiva/lids clear. Pupils round, reactive. EOMs intact.  EARS: External exam WNL, canals clear. Hearing grossly normal.  NOSE: No deformity or discharge.  MOUTH/THROAT: Lips w/o lesions  RESPIRATORY: Breathing is even, unlabored. Lung sounds are clear   CARDIOVASCULAR: Heart RRR no murmurs, rubs or gallops. No peripheral edema.   GASTROINTESTINAL: Abdomen is soft, non-tender, not distended w/ normal bowel sounds. GENITOURINARY: Bladder non tender, not  distended  MUSCULOSKELETAL: Cast right leg NEUROLOGIC:  Cranial nerves 2-12 grossly intact. Moves all extremities  PSYCHIATRIC: Mood and affect appropriate to situation with dementia, no behavioral issues  Patient Active Problem List   Diagnosis Date Noted  . Aphasia 09/15/2017  . ARF (acute renal failure) ([place]) 09/14/2017  . Hypertension 09/10/2017  . Hyperlipidemia 09/10/2017  . Depression 09/10/2017  . 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 Overactive bladder 09/10/2017  . Paroxysmal atrial fibrillation ([facility])   . History of stroke   . History of seizure   . Vascular dementia with behavior disturbance   . Cerebral infarction ([place])   . Respiratory failure ([place])   . Goals of care, counseling/discussion   . Palliative care by specialist   . Stroke (cerebrum) ([place]) 09/01/2017  . AVM (arteriovenous malformation) of colon with hemorrhage 08/09/2016  . Diabetes mellitus ([place]) 08/09/1997      Labs reviewed: Basic Metabolic Panel:    Component Value Date/Time   NA 139 09/21/2017 1136   NA 140 09/09/2017   K 5.2 (H) 09/21/2017 1136   CL 107 09/21/2017 1136   CO2 22 09/21/2017 1136   GLUCOSE 131 (H) 09/21/2017 1136   BUN 45 (H) 09/21/2017 1136   BUN 50 (A) 09/09/2017   CREATININE 2.11 (H) 09/21/2017 1136   CALCIUM 6.5 (L) 09/21/2017 1136   PROT 4.7 (L) 09/15/2017 0806   ALBUMIN 2.1 (L) 09/21/2017 0952   AST 24 09/15/2017 0806   ALT 18 09/15/2017 0806   ALKPHOS 173 (H) 09/15/2017 0806   BILITOT 0.3 09/15/2017 0806   GFRNONAA 23 (L) 09/21/2017 1136   GFRAA 26 (L) 09/21/2017 1136    Recent Labs    09/02/17 1000 09/03/17 0309  09/19/17 0204 09/20/17 0532 09/21/17 0952 09/21/17 1136  NA  --  145   < >  145 148* 142 139  K  --  4.2   < > 4.6 4.7 5.4* 5.2*  CL  --  119*   < > 113* 111 107 107  CO2  --  13*   < > 21* 24 24 22   GLUCOSE  --  98   < > 122* 103* 143* 131*  BUN  --  58*   < > 98* 70* 45* 45*  CREATININE  --  2.26*   < > 3.30* 2.63* 2.18* 2.11*  CALCIUM   --  6.7*   < > 6.9* 6.8* 6.7* 6.5*  MG 1.7 1.7  --  2.5*  --   --   --   PHOS 6.6* 5.7*   < > 4.0 3.4 2.8  --    < > = values in this interval not displayed.   Liver Function Tests: Recent Labs    09/01/17 1051 09/14/17 1925 09/15/17 0806  09/19/17 0204 09/20/17 0532 09/21/17 0952  AST 15 26 24   --   --   --   --   ALT 10* 15 18  --   --   --   --   ALKPHOS 85 178* 173*  --   --   --   --   BILITOT 0.5 0.5 0.3  --   --   --   --   PROT 4.9* 4.9* 4.7*  --   --   --   --   ALBUMIN 2.5* 2.5* 2.3*   < > 1.9* 2.0* 2.1*   < > = values in this interval not displayed.   No results for input(s): LIPASE, AMYLASE in the last 8760 hours. No results for input(s): AMMONIA in the last 8760 hours. CBC: Recent Labs    09/01/17 1051  09/03/17 0309  09/14/17 1925 09/14/17 1932 09/15/17 0806 09/16/17 0807  WBC 10.4  --  11.7*   < > 15.0*  --  14.3* 14.7*  NEUTROABS 8.8*  --  9.5*  --  12.8*  --   --   --   HGB 10.6*   < > 10.8*   < > 13.8 15.0 13.8 12.5  HCT 33.7*   < > 34.4*   < > 43.1 44.0 42.4 39.2  MCV 80.6  --  81.9   < > 79.8  --  79.4 79.0  PLT 211  --  193   < > 292  --  294 278   < > = values in this interval not displayed.   Lipid Recent Labs    09/02/17 0212  CHOL 156  HDL 47  LDLCALC 101*  TRIG 41    Cardiac Enzymes: Recent Labs    09/15/17 0806 09/15/17 1306 09/15/17 2029  TROPONINI <0.03 <0.03 <0.03   BNP: No results for input(s): BNP in the last 8760 hours. No results found for: [facility] Results  Component Value Date   HGBA1C 6.5 (H) 09/02/2017   No results found for: TSH No results found for: VITAMINB12 No results found for: FOLATE No results found for: IRON, TIBC, FERRITIN  Imaging and Procedures obtained prior to SNF admission: Dg Chest 2 View  Result Date: 09/14/2017 CLINICAL DATA:  Increased slurred speech and altered mental status. EXAM: CHEST  2 VIEW COMPARISON:  09/03/2017. FINDINGS: Normal heart size. Loop recorder. Clear lung  fields. Thoracic atherosclerosis. No osseous findings. IMPRESSION: No active cardiopulmonary disease.  Stable exam. Electronically Signed   By: Staci Righter M.D.   On:  09/14/2017 20:22   Ct Head Wo Contrast  Result Date: 09/14/2017 CLINICAL DATA:  Increased slurred speech and altered mental status EXAM: CT HEAD WITHOUT CONTRAST TECHNIQUE: Contiguous axial images were obtained from the base of the skull through the vertex without intravenous contrast. COMPARISON:  09/04/2017, 09/02/2017, 09/01/2017, 10/22/2016 FINDINGS: Brain: Multifocal old infarcts involving the right temporal lobe, right parietal lobe, inferior left frontal lobe, and left sub insula and periventricular white matter. Hypodensity within the left posterior insula and medial posterior temporal lobe, corresponding to the infarct demonstrated on MRI 09/04/2016. Left cerebellar infarct again demonstrated. No hemorrhage. No mass. No midline shift. Moderate small vessel ischemic changes of the white matter. Moderate atrophy. Vascular: No hyperdense vessels.  Carotid artery calcification. Skull: No fracture.  Hyperostosis. Sinuses/Orbits: Mild mucosal thickening in the ethmoid sinuses. No acute orbital abnormality. Left lens extraction. Other: None IMPRESSION: 1. Left cerebellar, left posterior insula, and left posteromedial temporal lobe infarcts as noted on recent MRI 09/04/2017; negative for hemorrhage or significant mass effect. 2. Multifocal old bilateral infarcts, atrophy, and small vessel ischemic changes of the white matter. Electronically Signed   By: Donavan Foil M.D.   On: 09/14/2017 20:17   Mr Brain Wo Contrast  Result Date: 09/15/2017 CLINICAL DATA:  Increasing weakness and aphasia. EXAM: MRI HEAD WITHOUT CONTRAST TECHNIQUE: Multiplanar, multiecho pulse sequences of the brain and surrounding structures were obtained without intravenous contrast. COMPARISON:  CT head 09/14/2017. MRI 09/04/2017. FINDINGS: Brain: Involuting restricted  diffusion of the previously identified infarcts in the LEFT cerebellum, RIGHT cerebellum, LEFT insula, LEFT posterior frontoparietal cortex and white matter, and LEFT occipital cortex. The LEFT cerebellar area of cortical restricted diffusion is slightly better seen on today's study than priors, but this could be due to slice selection; no change in distribution. No new areas of acute infarction are seen. Chronic bihemispheric infarcts are stable. Atrophy and small vessel disease. Vascular: Flow voids are maintained throughout the carotid, basilar, and vertebral arteries. Tiny chronic hemorrhage RIGHT thalamus. Slight chronic hemorrhage lines the bed of the old RIGHT hemisphere infarct. Skull and upper cervical spine: Normal marrow signal. Sinuses/Orbits: Negative. Other: None. IMPRESSION: Involuting areas of restricted diffusion consistent with typical evolutionary change for acute into subacute infarction. No hemorrhagic transformation. No new areas of ischemia. Electronically Signed   By: Staci Righter M.D.   On: 09/15/2017 08:20   US Renal  Result Date: 09/15/2017 CLINICAL DATA:  Post left nephrectomy for RCC. Diabetes. Acute renal insufficiency. EXAM: RENAL / URINARY TRACT ULTRASOUND COMPLETE COMPARISON:  Body CT 08/14/2017 FINDINGS: Right Kidney: Length: 11.2 cm. Mildly increased cortical echogenicity. There is a benign-appearing 1.2 cm cyst, exophytic off of the lower pole of the left kidney. There is a hypoechoic indeterminate nodule in the midpole region of the right kidney which measures 1.8 x 1.6 x 2.1 cm. There is a benign-appearing cyst in the lateral mid polar cortex of the right kidney measuring 1.8 cm Left Kidney: Surgically absent. Bladder: Appears normal for degree of bladder distention. IMPRESSION: Mildly increased cortical echogenicity of the right kidney, likely due to medical renal disease. 1.8 cm indeterminate hypoechoic nodule in the midpole region of the right kidney. CT or MRI with  contrast, renal protocol, may be considered when clinically feasible, if further imaging evaluation is desired. Electronically Signed   By: Fidela Salisbury M.D.   On: 09/15/2017 15:58     Not all labs, radiology exams or other studies done during hospitalization come through on my EPIC note; however they  are reviewed by me.    Assessment and Plan  Acute on chronic renal failure stage III/and non-gap metabolic acidosis/hypokalemia/hypernatremia-patient treated bicarbonate fluids O on treated with D5; renal function improved along with BUN; continue renal diet; recommended if no significant improvement in patient's renal function or there is further worsening pounds occasionally consulted SNF - admitted for OT/PT; patient would like to have her diet liberalized because she is not eating well; we'll have to speak to nephrology  Hypocalcemia-possibly related to renal disease; chronic from at least early 2018; PTH elevated, vitamin D low; nephrology recommends [place] calcitriol magnesium SNF -plan to continue this and wishes calcium carbonate 500 mg 2 tablets 3 times a day, calcitriol 0.25 g daily   History of CVA-in this or concern was for acute CVA was ruled out by MRI-patient with residual aphasia and right-sided weakness SNF - continue  plavix 75 mg daily  Multiple right foot and clavicle fracture-patient follow-up with orthopedic surgery SNF - continue nonweightbearing right lower extremity, right lower shin recast and sling right upper extremity  UTI/final results showed multiple species;  patient completed a course of Rocephin  Paroxysmal atrial fibrillation-not on anticoagulation secondary GI bleed; reviewed telemetry with intermittent tachycardia and frequent PVCs and intermittent nonsustained episodes of RVR SNF - continue metoprolol 50 mg daily and a prophylaxis with Plavix 75 mg daily  Hypothyroidism SNF - stable; continue levothyroxine 150 g daily  Diabetes mellitus 2 SNF -  A1c 6.5 on diet and continue diabetic diet  Seizure disorder SNF -continue Lamictal 100 mg 3 times a day   Time spent greater than 45 minutes;> 50% of time with patient was spent reviewing records, labs, tests and studies, counseling and developing plan of care  Webb Silversmith D. Daysha Ashmore,MD

## 2017-09-26 ENCOUNTER — Encounter: Payer: Self-pay | Admitting: Internal Medicine

## 2017-09-27 DIAGNOSIS — N390 Urinary tract infection, site not specified: Secondary | ICD-10-CM | POA: Insufficient documentation

## 2017-09-27 DIAGNOSIS — E872 Acidosis, unspecified: Secondary | ICD-10-CM | POA: Insufficient documentation

## 2017-09-27 DIAGNOSIS — S42001A Fracture of unspecified part of right clavicle, initial encounter for closed fracture: Secondary | ICD-10-CM | POA: Insufficient documentation

## 2017-09-27 DIAGNOSIS — S92909G Unspecified fracture of unspecified foot, subsequent encounter for fracture with delayed healing: Secondary | ICD-10-CM | POA: Insufficient documentation

## 2017-09-27 DIAGNOSIS — E876 Hypokalemia: Secondary | ICD-10-CM | POA: Insufficient documentation

## 2017-09-27 DIAGNOSIS — E870 Hyperosmolality and hypernatremia: Secondary | ICD-10-CM | POA: Insufficient documentation

## 2017-09-28 ENCOUNTER — Non-Acute Institutional Stay (SKILLED_NURSING_FACILITY): Payer: Medicare Other | Admitting: Internal Medicine

## 2017-09-28 ENCOUNTER — Encounter: Payer: Self-pay | Admitting: Internal Medicine

## 2017-09-28 DIAGNOSIS — E890 Postprocedural hypothyroidism: Secondary | ICD-10-CM

## 2017-09-28 DIAGNOSIS — I480 Paroxysmal atrial fibrillation: Secondary | ICD-10-CM | POA: Diagnosis not present

## 2017-09-28 DIAGNOSIS — S42001D Fracture of unspecified part of right clavicle, subsequent encounter for fracture with routine healing: Secondary | ICD-10-CM

## 2017-09-28 DIAGNOSIS — I10 Essential (primary) hypertension: Secondary | ICD-10-CM

## 2017-09-28 DIAGNOSIS — E870 Hyperosmolality and hypernatremia: Secondary | ICD-10-CM | POA: Diagnosis not present

## 2017-09-28 DIAGNOSIS — E1122 Type 2 diabetes mellitus with diabetic chronic kidney disease: Secondary | ICD-10-CM | POA: Diagnosis not present

## 2017-09-28 DIAGNOSIS — S92909G Unspecified fracture of unspecified foot, subsequent encounter for fracture with delayed healing: Secondary | ICD-10-CM

## 2017-09-28 DIAGNOSIS — Z794 Long term (current) use of insulin: Secondary | ICD-10-CM

## 2017-09-28 DIAGNOSIS — N179 Acute kidney failure, unspecified: Secondary | ICD-10-CM | POA: Diagnosis not present

## 2017-09-28 DIAGNOSIS — E876 Hypokalemia: Secondary | ICD-10-CM | POA: Diagnosis not present

## 2017-09-28 DIAGNOSIS — Z8673 Personal history of transient ischemic attack (TIA), and cerebral infarction without residual deficits: Secondary | ICD-10-CM

## 2017-09-28 DIAGNOSIS — E872 Acidosis, unspecified: Secondary | ICD-10-CM

## 2017-09-28 DIAGNOSIS — N183 Chronic kidney disease, stage 3 unspecified: Secondary | ICD-10-CM

## 2017-09-28 DIAGNOSIS — Z87898 Personal history of other specified conditions: Secondary | ICD-10-CM | POA: Diagnosis not present

## 2017-09-28 NOTE — Progress Notes (Signed)
Provider: Noah Delaine. Sheppard Coil, MD Location:  [place] Room Number: [phone] Place of Service:  SNF ([phone])  PCP: Drake Leach, MD Patient Care Team: Drake Leach, MD as PCP - General (Internal Medicine)  Extended Emergency Contact Information Primary Emergency Contact: Etta Quill Address: [address], [place] [address] Johnnette Litter of [place] Phone: [phone] Relation: Spouse Secondary Emergency Contact: [place], [place] Mobile Phone: [phone] Relation: Son  Allergies  Allergen Reactions  . Atorvastatin Itching and Other (See Comments)    Myopathy also   . Carvedilol Itching  . Cinoxacin Itching and Other (See Comments)    Hypotension also  . Etodolac Other (See Comments), Itching and Hives  . Metoclopramide Itching  . Moxifloxacin Itching  . Yellow Dye Hives  . Paroxetine Hcl Other (See Comments)    Unknown reaction  . Rosuvastatin Calcium Other (See Comments)    Myalgias  . Nitrofurantoin Other (See Comments)    Yeast infection  . Rivaroxaban Other (See Comments)    GI bleed  . Sulfa Antibiotics Hives and Itching  . Yellow Dyes (Non-Tartrazine) Other (See Comments)    unspecified  . Aspirin-Dipyridamole Er Itching  . Ezetimibe-Simvastatin Itching  . Pitavastatin Itching    Chief Complaint  Patient presents with  . Discharge Note    HPI:  72 y.o. female  history of recent stroke status post TPA, paroxysmal atrial fib, history of GI bleed secondary to AVMs, hypertension, hyperlipidemia, who was brought to the ER after patient's family found the patient getting increasingly confused and aphasic  since the evening prior. The patient was admitted to [facility] from 2/66-13 where MRI ruled out acute CVA, patient with residual aphasia and right sided weakness, which improved. Hospital course was complicated by acute kidney injury on chronic kidney disease 3 with anion gap metabolic acidosis and mild  hyperkalemia as well as hypocalcemia. Patient was admitted to skilled nursing facility for OT/PT and is now ready to be discharged to home.    Past Medical History:  Diagnosis Date  . Anemia   . Arthritis of ankle or foot, degenerative 04/30/2016  . Atrial fibrillation ([place]) 09/2016   with left atrial appendage, failed xarelto due to GIB, unable to have watchman due to nickel allergy and Coumadin never started  . AVM (arteriovenous malformation) of colon with hemorrhage 2018   requiring transfusion --- last one was March 2018 - HgB on 10/22/2016 was 7.3 and on 10/23/2016 was 8.7  . Bacteriuria with pyuria 08/14/2015  . Benign hypertension 1995  . Cancer of kidney ([place])   . Cholelithiasis 2008  . Chronic conjunctivitis 07/04/2012  . Chronic kidney disease   . Clostridium difficile colitis 2017  . Cognitive impairment   . Colon polyp 2005   and 11/16/2016  . COPD (chronic obstructive pulmonary disease) ([place])    and currenlty smokes--- refuses to use HS home O2  . Coronary artery disease 2002  . CVA (cerebral vascular accident) ([place])   . Cystitis    INTESTINAL CHRONIC  . DDD (degenerative disc disease), cervical   . Depression   . Diabetes mellitus ([place]) 1999   HGBA1C on 09/09/2016 was 6.8  . Disease of thyroid gland   . History of kidney cancer    LEFT  . History of pancreatitis   . History of transfusion 10/2016  . Hyperlipidemia   . Hypertension   . Hypothyroidism   . Iron deficiency  anemia 09/27/2016  . Left homonymous hemianopsia 07/04/2012  . Lisfranc dislocation, right, initial encounter 08/14/2017  . Memory impairment 09/09/2016  . Nuclear sclerosis of left eye 12/09/2016   added automatically from request for surgery [phone]  . OSA (obstructive sleep apnea)    o2 3L [place] PRN  . Pancreatitis 2008  . Posterior vitreous detachment 07/04/2012  . Postsurgical hypothyroidism    S/P partial thyroidectomy 1978, later went for additional parital thyroidectomy  . Renal cell  adenocarcinoma, left ([place]) 2005   [place]  . Retinal drusen 07/04/2012  . Shortness of breath   . Sinus tarsitis of left foot 09/15/2016  . Snores 2005   does not use CPAP regularly  . Stroke [facility]) 2011   LLE sensory, 2012 x2 and 2013 x1; [place]  . TIA (transient ischemic attack) 10/21/2016  . Type 2 diabetes mellitus ([place])   . Type 2 diabetes mellitus with stage 3 chronic kidney disease, with long-term current use of insulin ([place])    on 10/23/2016 Scr was 1.89, GFR 26 and K+ 4.2    Past Surgical History:  Procedure Laterality Date  . CATARACT EXTRACTION    . CATARACT EXTRACTION W/ INTRAOCULAR LENS IMPLANT Left 01/19/2017   Procedure: PHACOEMULSIFICATION PC / IOL TOPICAL; Surgeon: Sonia Side Burden, MD; Location: [place]; Service: Ophthalmology; Laterality: Left;  . CHOLECYSTECTOMY    . COLONOSCOPY Left 09/22/2016   Procedure: COLONOSCOPY, FLEXIBLE, PROXIMAL TO SPLENIC FLEXURE; DIAGNOSTIC, W/WO COLLECTION SPECIMEN BY BRUSH OR [place]; Surgeon: Ned Card, MD; Location: Endo Procedures [facility]; Service: Gastroenterology  . COLONOSCOPY  11/16/2016   Procedure: COLONOSCOPY, FLEXIBLE, PROXIMAL TO SPLENIC FLEXURE; DIAGNOSTIC, W/WO COLLECTION SPECIMEN BY BRUSH OR [place]; Surgeon: Ned Card, MD; Location: Endo Procedures [facility]; Service: Gastroenterology  . COLONOSCOPY W/ BIOPSIES  10/07/2016   Procedure: COLONOSCOPY, FLEXIBLE, PROXIMAL TO SPLENIC FLEXURE; WITH BIOPSY, SINGLE OR MULTIPLE; Surgeon: Bing Quarry, MD; Location: Endo Procedures [facility]; Service: Gastroenterology  . CORONARY STENT PLACEMENT  2002  . GALLBLADDER SURGERY  2008  . LOOP RECORDER IMPLANT  2018   medtronic reveal linq  . NEPHRECTOMY Left 2005   renal cancer   . THYROIDECTOMY, PARTIAL  1978   x2  . TOTAL ABDOMINAL HYSTERECTOMY W/ BILATERAL SALPINGOOPHORECTOMY  1984  . TUBAL LIGATION  1980  . UPPER GASTROINTESTINAL ENDOSCOPY Left 09/22/2016   Procedure: UGI ENDO, INCLUDE ESOPHAGUS, STOMACH, &  DUODENUM &/OR JEJUNUM; DX W/WO COLLECTION SPECIMN, BY BRUSH OR [place]; Surgeon: Ned Card, MD; Location: Endo Procedures [facility]; Service: Gastroenterology     reports that she quit smoking about a year ago. Her smoking use included cigarettes. She has a 50.00 pack-year smoking history. she has never used smokeless tobacco. She reports that she does not drink alcohol. Her drug history is not on file. Social History   Socioeconomic History  . Marital status: Married    Spouse name: Not on file  . Number of children: Not on file  . Years of education: Not on file  . Highest education level: Not on file  Social Needs  . Financial resource strain: Not on file  . Food insecurity - worry: Not on file  . Food insecurity - inability: Not on file  . Transportation needs - medical: Not on file  . Transportation needs - non-medical: Not on file  Occupational History  . Not on file  Tobacco Use  . Smoking status: Former Smoker    Packs/day: 1.00    Years: 50.00    Pack years: 50.00  Types: Cigarettes    Last attempt to quit: 10/21/2016    Years since quitting: 0.9  . Smokeless tobacco: Never Used  Substance and Sexual Activity  . Alcohol use: No    Frequency: Never  . Drug use: Not on file  . Sexual activity: No  Other Topics Concern  . Not on file  Social History Narrative  . Not on file    Pertinent  Health Maintenance Due  Topic Date Due  . FOOT EXAM  06/18/1957  . OPHTHALMOLOGY EXAM  06/18/1957  . URINE MICROALBUMIN  06/18/1957  . MAMMOGRAM  06/18/1997  . COLONOSCOPY  06/18/1997  . DEXA SCAN  06/18/2012  . PNA vac Low Risk Adult (2 of 2 - PPSV23) 05/20/2016  . HEMOGLOBIN A1C  03/02/2018  . INFLUENZA VACCINE  Completed    Medications: Allergies as of 09/28/2017      Reactions   Atorvastatin Itching, Other (See Comments)   Myopathy also   Carvedilol Itching   Cinoxacin Itching, Other (See Comments)   Hypotension also   Etodolac Other (See Comments), Itching,  Hives   Metoclopramide Itching   Moxifloxacin Itching   Yellow Dye Hives   Paroxetine Hcl Other (See Comments)   Unknown reaction   Rosuvastatin Calcium Other (See Comments)   Myalgias   Nitrofurantoin Other (See Comments)   Yeast infection   Rivaroxaban Other (See Comments)   GI bleed   Sulfa Antibiotics Hives, Itching   Yellow Dyes (non-tartrazine) Other (See Comments)   unspecified   Aspirin-dipyridamole Er Itching   Ezetimibe-simvastatin Itching   Pitavastatin Itching      Medication List        Accurate as of 09/28/17 11:59 PM. Always use your most recent med list.          buPROPion 150 MG 12 hr tablet Commonly known as:  WELLBUTRIN SR Take 150 mg by mouth every morning.   calcitRIOL 0.25 MCG capsule Commonly known as:  ROCALTROL Take 1 capsule (0.25 mcg total) by mouth daily.   calcium carbonate 500 MG chewable tablet Commonly known as:  TUMS - dosed in mg elemental calcium Chew 2 tablets (400 mg of elemental calcium total) by mouth 3 (three) times daily with meals.   clopidogrel 75 MG tablet Commonly known as:  PLAVIX Take 75 mg by mouth daily.   donepezil 10 MG tablet Commonly known as:  ARICEPT Take 5 mg by mouth daily.   ezetimibe 10 MG tablet Commonly known as:  ZETIA Take 10 mg by mouth daily.   FISH OIL PO Take 1 capsule by mouth daily.   insulin aspart protamine- aspart (70-30) 100 UNIT/ML injection Commonly known as:  NOVOLOG MIX 70/30 Inject 3 Units into the skin 2 (two) times daily.   lamoTRIgine 100 MG tablet Commonly known as:  LAMICTAL Take 1 tablet (100 mg total) by mouth 2 (two) times daily.   levothyroxine 150 MCG tablet Commonly known as:  SYNTHROID, LEVOTHROID Take 150 mcg by mouth daily.   metoprolol tartrate 50 MG tablet Commonly known as:  LOPRESSOR Take 50 mg by mouth daily.   metroNIDAZOLE 0.75 % vaginal gel Commonly known as:  METROGEL Place 1 Applicatorful vaginally daily as needed for itching.   pravastatin 80  MG tablet Commonly known as:  PRAVACHOL Take 80 mg by mouth daily.   sertraline 100 MG tablet Commonly known as:  ZOLOFT Take 200 mg by mouth daily.   traMADol 50 MG tablet Commonly known as:  ULTRAM Take by  mouth every 6 (six) hours as needed.        Vitals:   09/28/17 1353  BP: (!) 157/84  Pulse: 84  Resp: 18  Temp: 97.8 F (36.6 C)  SpO2: 94%  Weight: 138 lb 12.8 oz (63 kg)  Height: 5' 4.5" (1.638 m)   Body mass index is 23.46 kg/m.  Physical Exam  GENERAL APPEARANCE: Alert, conversant. No acute distress.  HEENT: Unremarkable. RESPIRATORY: Breathing is even, unlabored. Lung sounds are clear   CARDIOVASCULAR: Heart RRR no murmurs, rubs or gallops. No peripheral edema.  GASTROINTESTINAL: Abdomen is soft, non-tender, not distended w/ normal bowel sounds.  NEUROLOGIC: Cranial nerves 2-12 grossly intact. Moves all extremities   Labs reviewed: Basic Metabolic Panel: Recent Labs    09/02/17 1000 09/03/17 0309  09/19/17 0204 09/20/17 0532 09/21/17 0952 09/21/17 1136  NA  --  145   < > 145 148* 142 139  K  --  4.2   < > 4.6 4.7 5.4* 5.2*  CL  --  119*   < > 113* 111 107 107  CO2  --  13*   < > 21* 24 24 22   GLUCOSE  --  98   < > 122* 103* 143* 131*  BUN  --  58*   < > 98* 70* 45* 45*  CREATININE  --  2.26*   < > 3.30* 2.63* 2.18* 2.11*  CALCIUM  --  6.7*   < > 6.9* 6.8* 6.7* 6.5*  MG 1.7 1.7  --  2.5*  --   --   --   PHOS 6.6* 5.7*   < > 4.0 3.4 2.8  --    < > = values in this interval not displayed.   No results found for: [facility] Liver Function Tests: Recent Labs    09/01/17 1051 09/14/17 1925 09/15/17 0806  09/19/17 0204 09/20/17 0532 09/21/17 0952  AST 15 26 24   --   --   --   --   ALT 10* 15 18  --   --   --   --   ALKPHOS 85 178* 173*  --   --   --   --   BILITOT 0.5 0.5 0.3  --   --   --   --   PROT 4.9* 4.9* 4.7*  --   --   --   --   ALBUMIN 2.5* 2.5* 2.3*   < > 1.9* 2.0* 2.1*   < > = values in this interval not displayed.   No  results for input(s): LIPASE, AMYLASE in the last 8760 hours. No results for input(s): AMMONIA in the last 8760 hours. CBC: Recent Labs    09/01/17 1051  09/03/17 0309  09/14/17 1925 09/14/17 1932 09/15/17 0806 09/16/17 0807  WBC 10.4  --  11.7*   < > 15.0*  --  14.3* 14.7*  NEUTROABS 8.8*  --  9.5*  --  12.8*  --   --   --   HGB 10.6*   < > 10.8*   < > 13.8 15.0 13.8 12.5  HCT 33.7*   < > 34.4*   < > 43.1 44.0 42.4 39.2  MCV 80.6  --  81.9   < > 79.8  --  79.4 79.0  PLT 211  --  193   < > 292  --  294 278   < > = values in this interval not displayed.   Lipid Recent Labs    09/02/17  0212  CHOL 156  HDL 47  LDLCALC 101*  TRIG 41   Cardiac Enzymes: Recent Labs    09/15/17 0806 09/15/17 1306 09/15/17 2029  TROPONINI <0.03 <0.03 <0.03   BNP: No results for input(s): BNP in the last 8760 hours. CBG: Recent Labs    09/21/17 0433 09/21/17 0740 09/21/17 1237  GLUCAP 106* 105* 117*    Procedures and Imaging Studies During Stay: Dg Chest 2 View  Result Date: 09/14/2017 CLINICAL DATA:  Increased slurred speech and altered mental status. EXAM: CHEST  2 VIEW COMPARISON:  09/03/2017. FINDINGS: Normal heart size. Loop recorder. Clear lung fields. Thoracic atherosclerosis. No osseous findings. IMPRESSION: No active cardiopulmonary disease.  Stable exam. Electronically Signed   By: Staci Righter M.D.   On: 09/14/2017 20:22   Dg Clavicle Right  Result Date: 09/02/2017 CLINICAL DATA:  Upper chest bruising EXAM: RIGHT CLAVICLE - 2+ VIEWS COMPARISON:  None. FINDINGS: Midshaft right clavicular fracture is noted with downward displacement of the distal fracture fragment. Degenerative changes of the acromioclavicular joint are seen. The humeral head is well seated. The underlying bony thorax is within normal limits. IMPRESSION: Midshaft right clavicular fracture Electronically Signed   By: Inez Catalina M.D.   On: 09/02/2017 11:15   Dg Ankle Complete Right  Result Date:  09/02/2017 CLINICAL DATA:  Known foot fractures EXAM: RIGHT ANKLE - COMPLETE 3+ VIEW COMPARISON:  08/14/2017 FINDINGS: Casting material limits fine bony detail. The degree of soft tissue swelling has reduced somewhat in the interval from the prior exam. Calcaneal spurring is again noted. Chronic changes about the talonavicular joint are again seen and stable. The known tarsal and metatarsal fractures are not well appreciated on this exam. IMPRESSION: No acute abnormality noted. Known foot fractures are not well appreciated on this study. Electronically Signed   By: Inez Catalina M.D.   On: 09/02/2017 11:16   Ct Head Wo Contrast  Result Date: 09/14/2017 CLINICAL DATA:  Increased slurred speech and altered mental status EXAM: CT HEAD WITHOUT CONTRAST TECHNIQUE: Contiguous axial images were obtained from the base of the skull through the vertex without intravenous contrast. COMPARISON:  09/04/2017, 09/02/2017, 09/01/2017, 10/22/2016 FINDINGS: Brain: Multifocal old infarcts involving the right temporal lobe, right parietal lobe, inferior left frontal lobe, and left sub insula and periventricular white matter. Hypodensity within the left posterior insula and medial posterior temporal lobe, corresponding to the infarct demonstrated on MRI 09/04/2016. Left cerebellar infarct again demonstrated. No hemorrhage. No mass. No midline shift. Moderate small vessel ischemic changes of the white matter. Moderate atrophy. Vascular: No hyperdense vessels.  Carotid artery calcification. Skull: No fracture.  Hyperostosis. Sinuses/Orbits: Mild mucosal thickening in the ethmoid sinuses. No acute orbital abnormality. Left lens extraction. Other: None IMPRESSION: 1. Left cerebellar, left posterior insula, and left posteromedial temporal lobe infarcts as noted on recent MRI 09/04/2017; negative for hemorrhage or significant mass effect. 2. Multifocal old bilateral infarcts, atrophy, and small vessel ischemic changes of the white matter.  Electronically Signed   By: Donavan Foil M.D.   On: 09/14/2017 20:17   Ct Head Wo Contrast  Result Date: 09/02/2017 CLINICAL DATA:  Altered mental status EXAM: CT HEAD WITHOUT CONTRAST TECHNIQUE: Contiguous axial images were obtained from the base of the skull through the vertex without intravenous contrast. COMPARISON:  Head CT 09/01/2017 FINDINGS: Brain: There is a new focus of hypoattenuation in the left superior cerebellum consistent with acute to early subacute infarct. Other sites of chronic infarction are unchanged. There is no acute  hemorrhage. No midline shift or other significant mass effect. Vascular: No hyperdense vessel or unexpected calcification. Skull: Normal. Negative for fracture or focal lesion. Sinuses/Orbits: No acute finding. Other: None. IMPRESSION: 1. Acute to early subacute infarct of the left cerebellum, within the territory of the superior cerebellar artery. 2. Otherwise unchanged appearance of multiple old infarcts. 3. No acute hemorrhage, herniation or hydrocephalus. Electronically Signed   By: Ulyses Jarred M.D.   On: 09/02/2017 21:04   Mr Brain Wo Contrast  Result Date: 09/15/2017 CLINICAL DATA:  Increasing weakness and aphasia. EXAM: MRI HEAD WITHOUT CONTRAST TECHNIQUE: Multiplanar, multiecho pulse sequences of the brain and surrounding structures were obtained without intravenous contrast. COMPARISON:  CT head 09/14/2017. MRI 09/04/2017. FINDINGS: Brain: Involuting restricted diffusion of the previously identified infarcts in the LEFT cerebellum, RIGHT cerebellum, LEFT insula, LEFT posterior frontoparietal cortex and white matter, and LEFT occipital cortex. The LEFT cerebellar area of cortical restricted diffusion is slightly better seen on today's study than priors, but this could be due to slice selection; no change in distribution. No new areas of acute infarction are seen. Chronic bihemispheric infarcts are stable. Atrophy and small vessel disease. Vascular: Flow voids  are maintained throughout the carotid, basilar, and vertebral arteries. Tiny chronic hemorrhage RIGHT thalamus. Slight chronic hemorrhage lines the bed of the old RIGHT hemisphere infarct. Skull and upper cervical spine: Normal marrow signal. Sinuses/Orbits: Negative. Other: None. IMPRESSION: Involuting areas of restricted diffusion consistent with typical evolutionary change for acute into subacute infarction. No hemorrhagic transformation. No new areas of ischemia. Electronically Signed   By: Staci Righter M.D.   On: 09/15/2017 08:20   Mr Brain Wo Contrast  Result Date: 09/04/2017 CLINICAL DATA:  Disorientation.  Speech disturbance.  Dementia. EXAM: MRI HEAD WITHOUT CONTRAST TECHNIQUE: Multiplanar, multiecho pulse sequences of the brain and surrounding structures were obtained without intravenous contrast. COMPARISON:  CT 09/02/2017.  MRI 12/24/2013. FINDINGS: Brain: The study suffers from motion degradation. There is a punctate acute infarction in the right anterior mid cerebellum. There is a more confluent acute infarction in the left superior cerebellum measuring up to 3 cm in size. Acute infarction affects the left superior cerebellar peduncle. 3 cm region of acute infarction in the deep insula on the left. Few scattered punctate acute infarctions elsewhere in the insula and left parietal lobe. Small acute infarction in the posteromedial left temporal lobe. Chronic small-vessel ischemic changes affect the brainstem and cerebellum. There is old infarction in the right temporal and parietal region with atrophy and encephalomalacia. There chronic small-vessel ischemic changes of the hemispheric white matter. No sign of neoplastic mass lesion, acute hemorrhage, hydrocephalus or extra-axial collection. Vascular: Major vessels at the base of the brain show flow. Skull and upper cervical spine: Negative Sinuses/Orbits: Clear/normal Other: None IMPRESSION: Acute infarctions in the anterior and posterior  circulation suggesting embolic disease from the heart or ascending aorta. Acute infarctions are most prominently in the left superior cerebellum and left insula and parietal region with other punctate acute infarctions scattered within the left insula and parietal regions and within the right cerebellum and left posteromedial temporal lobe. Extensive chronic ischemic changes elsewhere throughout the brain including an old infarction in the right insula and temporoparietal region. Electronically Signed   By: Nelson Chimes M.D.   On: 09/04/2017 10:23   US Renal  Result Date: 09/15/2017 CLINICAL DATA:  Post left nephrectomy for RCC. Diabetes. Acute renal insufficiency. EXAM: RENAL / URINARY TRACT ULTRASOUND COMPLETE COMPARISON:  Body CT 08/14/2017 FINDINGS: Right  Kidney: Length: 11.2 cm. Mildly increased cortical echogenicity. There is a benign-appearing 1.2 cm cyst, exophytic off of the lower pole of the left kidney. There is a hypoechoic indeterminate nodule in the midpole region of the right kidney which measures 1.8 x 1.6 x 2.1 cm. There is a benign-appearing cyst in the lateral mid polar cortex of the right kidney measuring 1.8 cm Left Kidney: Surgically absent. Bladder: Appears normal for degree of bladder distention. IMPRESSION: Mildly increased cortical echogenicity of the right kidney, likely due to medical renal disease. 1.8 cm indeterminate hypoechoic nodule in the midpole region of the right kidney. CT or MRI with contrast, renal protocol, may be considered when clinically feasible, if further imaging evaluation is desired. Electronically Signed   By: Fidela Salisbury M.D.   On: 09/15/2017 15:58   Dg Chest Port 1 View  Result Date: 09/03/2017 CLINICAL DATA:  Respiratory failure EXAM: PORTABLE CHEST 1 VIEW COMPARISON:  09/02/2017 FINDINGS: Cardiac shadow is stable. Aortic calcifications are again seen. Loop recorder is again noted. Lungs are well aerated bilaterally. Mild interstitial changes are  again identified and stable. No focal infiltrate or sizable effusion is seen. IMPRESSION: Mild interstitial prominence stable from the previous exam. No focal infiltrate is noted. Electronically Signed   By: Inez Catalina M.D.   On: 09/03/2017 07:36   Dg Chest Port 1 View  Result Date: 09/02/2017 CLINICAL DATA:  COPD.  Hypertension.  History of TIA. EXAM: PORTABLE CHEST 1 VIEW COMPARISON:  Chest x-ray 10/21/2016. FINDINGS: Cardiac monitor device noted. Mild cardiomegaly. No evidence of pulmonary venous congestion. Mild bilateral interstitial prominence. Mild interstitial edema cannot be excluded. A component of the interstitial changes may be chronic. No pleural effusion or pneumothorax. Surgical clips in the neck. IMPRESSION: Mild cardiomegaly. Mild bilateral interstitial prominence noted. A component these changes may be chronic. Mild interstitial edema cannot be excluded. Electronically Signed   By: Marcello Moores  Register   On: 09/02/2017 11:16   Dg Foot Complete Right  Result Date: 09/02/2017 CLINICAL DATA:  History of prior fracture EXAM: RIGHT FOOT COMPLETE - 3+ VIEW COMPARISON:  08/14/2017 FINDINGS: Casting material is noted in place limiting fine bony detail. The known fractures are not as well appreciated as on recent CT examination. Fracture of the distal aspect of the second metatarsal is well visualized. No new focal abnormality is noted. IMPRESSION: Casting material limits fine bony detail and known tarsal and metatarsal fractures are not as well appreciated on this exam. No new focal abnormality is seen. Electronically Signed   By: Inez Catalina M.D.   On: 09/02/2017 11:13    Assessment/Plan:   Acute renal failure, unspecified acute renal failure type (HCC)  Metabolic acidosis, normal anion gap (NAG)  Hypokalemia  Hypocalcemia  Hypernatremia  History of CVA (cerebrovascular accident)  Multiple closed fractures of foot with delayed healing, subsequent encounter  Closed displaced  fracture of right clavicle with routine healing, unspecified part of clavicle, subsequent encounter  History of seizure  Postsurgical hypothyroidism  Type 2 diabetes mellitus with stage 3 chronic kidney disease, with long-term current use of insulin (HCC)  Paroxysmal atrial fibrillation ([place])  Essential hypertension   Patient is being discharged with the following home health services:  OT/PT/nursing  Patient is being discharged with the following durable medical equipment:  None  Patient has been advised to f/u with their PCP in 1-2 weeks to bring them up to date on their rehab stay.  Social services at facility was responsible for arranging this appointment.  Pt was provided with a 30 day supply of prescriptions for medications and refills must be obtained from their PCP.  For controlled substances, a more limited supply may be provided adequate until PCP appointment only.  Medications have been reconciled.  Time spent greater than 30 minutes;> 50% of time with patient was spent reviewing records, labs, tests and studies, counseling and developing plan of care  Noah Delaine. Sheppard Coil, MD

## 2017-09-28 NOTE — ED Provider Notes (Signed)
  [facility] 3W PROGRESSIVE CARE Provider Note   Review of Systems Review of Systems  Neurological: Positive for speech difficulty.  All other systems reviewed and are negative.    Physical Exam Updated Vital Signs BP (!) 144/94 (BP Location: Left Arm)   Pulse 85   Temp 97.6 F (36.4 C) (Oral)   Resp 16   Ht 5' 4.5" (1.638 m)   Wt 61.9 kg (136 lb 6.4 oz)   SpO2 97%   BMI 23.05 kg/m   Physical Exam  Constitutional: She appears well-developed and well-nourished.  HENT:  Head: Normocephalic and atraumatic.  Right Ear: External ear normal.  Left Ear: External ear normal.  Nose: Nose normal.  Mouth/Throat: Oropharynx is clear and moist.  Eyes: Conjunctivae and EOM are normal. Pupils are equal, round, and reactive to light.  Neck: Normal range of motion. Neck supple.  Cardiovascular: Normal rate, regular rhythm, normal heart sounds and intact distal pulses.  Pulmonary/Chest: Effort normal and breath sounds normal.  Abdominal: Soft. Bowel sounds are normal.  Musculoskeletal: Normal range of motion.  Right lower leg in cast  Neurological: She is alert.  Expressive aphasia.  Pt moving all 4 extremities.  Skin: Skin is warm. Capillary refill takes less than 2 seconds.  Psychiatric: She has a normal mood and affect.  Nursing note and vitals reviewed.     Isla Pence, MD 09/28/17 [phone]

## 2017-09-29 DIAGNOSIS — Z905 Acquired absence of kidney: Secondary | ICD-10-CM | POA: Insufficient documentation

## 2017-10-01 ENCOUNTER — Encounter: Payer: Self-pay | Admitting: Internal Medicine

## 2017-10-05 DIAGNOSIS — R4701 Aphasia: Secondary | ICD-10-CM | POA: Insufficient documentation

## 2017-10-10 DIAGNOSIS — F0150 Vascular dementia without behavioral disturbance: Secondary | ICD-10-CM | POA: Insufficient documentation

## 2017-11-20 ENCOUNTER — Other Ambulatory Visit: Payer: Self-pay

## 2017-11-20 ENCOUNTER — Emergency Department (HOSPITAL_COMMUNITY): Payer: Medicare Other

## 2017-11-20 ENCOUNTER — Emergency Department (HOSPITAL_COMMUNITY)
Admission: EM | Admit: 2017-11-20 | Discharge: 2017-11-20 | Disposition: A | Discharge status: Home / Self Care | Payer: Medicare Other | Attending: Emergency Medicine | Admitting: Emergency Medicine

## 2017-11-20 DIAGNOSIS — Z794 Long term (current) use of insulin: Secondary | ICD-10-CM | POA: Diagnosis not present

## 2017-11-20 DIAGNOSIS — Z79899 Other long term (current) drug therapy: Secondary | ICD-10-CM | POA: Diagnosis not present

## 2017-11-20 DIAGNOSIS — Z87891 Personal history of nicotine dependence: Secondary | ICD-10-CM | POA: Diagnosis not present

## 2017-11-20 DIAGNOSIS — I129 Hypertensive chronic kidney disease with stage 1 through stage 4 chronic kidney disease, or unspecified chronic kidney disease: Secondary | ICD-10-CM | POA: Insufficient documentation

## 2017-11-20 DIAGNOSIS — R29818 Other symptoms and signs involving the nervous system: Secondary | ICD-10-CM | POA: Insufficient documentation

## 2017-11-20 DIAGNOSIS — Z7902 Long term (current) use of antithrombotics/antiplatelets: Secondary | ICD-10-CM | POA: Insufficient documentation

## 2017-11-20 DIAGNOSIS — G459 Transient cerebral ischemic attack, unspecified: Secondary | ICD-10-CM

## 2017-11-20 DIAGNOSIS — J449 Chronic obstructive pulmonary disease, unspecified: Secondary | ICD-10-CM | POA: Insufficient documentation

## 2017-11-20 DIAGNOSIS — I6340 Cerebral infarction due to embolism of unspecified cerebral artery: Secondary | ICD-10-CM | POA: Diagnosis not present

## 2017-11-20 DIAGNOSIS — N183 Chronic kidney disease, stage 3 (moderate): Secondary | ICD-10-CM | POA: Insufficient documentation

## 2017-11-20 DIAGNOSIS — I2510 Atherosclerotic heart disease of native coronary artery without angina pectoris: Secondary | ICD-10-CM | POA: Diagnosis not present

## 2017-11-20 DIAGNOSIS — E1165 Type 2 diabetes mellitus with hyperglycemia: Secondary | ICD-10-CM | POA: Insufficient documentation

## 2017-11-20 DIAGNOSIS — E039 Hypothyroidism, unspecified: Secondary | ICD-10-CM | POA: Diagnosis not present

## 2017-11-20 DIAGNOSIS — R69 Illness, unspecified: Secondary | ICD-10-CM | POA: Diagnosis present

## 2017-11-20 DIAGNOSIS — E1122 Type 2 diabetes mellitus with diabetic chronic kidney disease: Secondary | ICD-10-CM | POA: Diagnosis not present

## 2017-11-20 LAB — DIFFERENTIAL
Basophils Absolute: 0 10*3/uL (ref 0.0–0.1)
Basophils Relative: 0 %
EOS PCT: 4 %
Eosinophils Absolute: 0.4 10*3/uL (ref 0.0–0.7)
LYMPHS PCT: 21 %
Lymphs Abs: 1.8 10*3/uL (ref 0.7–4.0)
MONO ABS: 0.4 10*3/uL (ref 0.1–1.0)
Monocytes Relative: 5 %
Neutro Abs: 6 10*3/uL (ref 1.7–7.7)
Neutrophils Relative %: 70 %

## 2017-11-20 LAB — COMPREHENSIVE METABOLIC PANEL
ALK PHOS: 64 U/L (ref 38–126)
ALT: 14 U/L (ref 14–54)
ANION GAP: 10 (ref 5–15)
AST: 16 U/L (ref 15–41)
Albumin: 3.7 g/dL (ref 3.5–5.0)
BUN: 66 mg/dL — ABNORMAL HIGH (ref 6–20)
CO2: 19 mmol/L — AB (ref 22–32)
Calcium: 8.1 mg/dL — ABNORMAL LOW (ref 8.9–10.3)
Chloride: 107 mmol/L (ref 101–111)
Creatinine, Ser: 2.55 mg/dL — ABNORMAL HIGH (ref 0.44–1.00)
GFR, EST AFRICAN AMERICAN: 21 mL/min — AB (ref >60)
GFR, EST NON AFRICAN AMERICAN: 18 mL/min — AB (ref >60)
Glucose, Bld: 114 mg/dL — ABNORMAL HIGH (ref 65–99)
Potassium: 5.3 mmol/L — ABNORMAL HIGH (ref 3.5–5.1)
SODIUM: 136 mmol/L (ref 135–145)
TOTAL PROTEIN: 6.4 g/dL — AB (ref 6.5–8.1)
Total Bilirubin: 0.6 mg/dL (ref 0.3–1.2)

## 2017-11-20 LAB — PROTIME-INR
INR: 1.03
Prothrombin Time: 13.4 s (ref 11.4–15.2)

## 2017-11-20 LAB — URINALYSIS, ROUTINE W REFLEX MICROSCOPIC
BACTERIA UA: NONE SEEN
Bilirubin Urine: NEGATIVE
Glucose, UA: NEGATIVE mg/dL
HGB URINE DIPSTICK: NEGATIVE
Ketones, ur: NEGATIVE mg/dL
LEUKOCYTES UA: NEGATIVE
NITRITE: NEGATIVE
PROTEIN: 30 mg/dL — AB
Specific Gravity, Urine: 1.013 (ref 1.005–1.030)
pH: 5 (ref 5.0–8.0)

## 2017-11-20 LAB — I-STAT CHEM 8, ED
BUN: 58 mg/dL — ABNORMAL HIGH (ref 6–20)
CREATININE: 2.7 mg/dL — AB (ref 0.44–1.00)
Calcium, Ion: 1.04 mmol/L — ABNORMAL LOW (ref 1.15–1.40)
Chloride: 106 mmol/L (ref 101–111)
Glucose, Bld: 109 mg/dL — ABNORMAL HIGH (ref 65–99)
HEMATOCRIT: 34 % — AB (ref 36.0–46.0)
HEMOGLOBIN: 11.6 g/dL — AB (ref 12.0–15.0)
POTASSIUM: 5.3 mmol/L — AB (ref 3.5–5.1)
Sodium: 138 mmol/L (ref 135–145)
TCO2: 22 mmol/L (ref 22–32)

## 2017-11-20 LAB — CBC
HCT: 36.5 % (ref 36.0–46.0)
Hemoglobin: 11.4 g/dL — ABNORMAL LOW (ref 12.0–15.0)
MCH: 26.6 pg (ref 26.0–34.0)
MCHC: 31.2 g/dL (ref 30.0–36.0)
MCV: 85.1 fL (ref 78.0–100.0)
PLATELETS: 311 10*3/uL (ref 150–400)
RBC: 4.29 MIL/uL (ref 3.87–5.11)
RDW: 17.5 % — AB (ref 11.5–15.5)
WBC: 8.6 10*3/uL (ref 4.0–10.5)

## 2017-11-20 LAB — RAPID URINE DRUG SCREEN, HOSP PERFORMED
Amphetamines: NOT DETECTED
Barbiturates: NOT DETECTED
Benzodiazepines: NOT DETECTED
Cocaine: NOT DETECTED
OPIATES: NOT DETECTED
Tetrahydrocannabinol: NOT DETECTED

## 2017-11-20 LAB — I-STAT TROPONIN, ED: TROPONIN I, POC: 0 ng/mL (ref 0.00–0.08)

## 2017-11-20 LAB — ETHANOL: Alcohol, Ethyl (B): <10 mg/dL (ref <10)

## 2017-11-20 LAB — APTT: aPTT: 29 s (ref 24–36)

## 2017-11-20 MED ORDER — PREDNISONE 20 MG PO TABS: 20.0000 mg | ORAL_TABLET | Freq: Every day | ORAL | 0 refills | Status: DC

## 2017-11-20 MED ORDER — AZITHROMYCIN 250 MG PO TABS: 250.0000 mg | ORAL_TABLET | Freq: Every day | ORAL | 0 refills | Status: DC

## 2017-11-20 MED ORDER — ALBUTEROL SULFATE HFA 108 (90 BASE) MCG/ACT IN AERS: 1.0000 | INHALATION_SPRAY | Freq: Four times a day (QID) | RESPIRATORY_TRACT | 0 refills | Status: DC | PRN

## 2017-11-20 NOTE — ED Provider Notes (Signed)
Blood pressure 131/90, pulse 68, temperature 97.7 F (36.5 C), resp. rate 14, height 5\' 4"  (1.626 m), weight 63.5 kg (140 lb), SpO2 94 %.  Assuming care from Dr. Tyrone Nine.  In short, Caroline Blake is a 72 y.o. female with a chief complaint of No chief complaint on file. Marland Kitchen  Refer to the original H&P for additional details.  The current plan of care is to follow up MRI and Neurology recommendations. Dr. Sandrea Matte has seen the patient and will leave recommendations.  08:19 AM Spoke with Dr. Sandrea Matte who has seen the patient and reviewed the MRI. He advises anticoagulation to prevent further CVA. Patient has wanted Cardiology to make this decision in the past. No bleeding from AVMs since treatment in the past. Also advises outpatient EEG. He advises no indication for admission as she has had two prior admission for w/u.   I have reviewed and discussed all results (EKG, imaging, lab, urine as appropriate), exam findings with patient. I have reviewed nursing notes and appropriate previous records.  I feel the patient is safe to be discharged home without further emergent workup. Discussed usual and customary return precautions. Patient and family (if present) verbalize understanding and are comfortable with this plan.  Patient will follow-up with their primary care provider. If they do not have a primary care provider, information for follow-up has been provided to them. All questions have been answered.   Nanda Quinton, MD    Margette Fast, MD 11/20/17 [phone]

## 2017-11-20 NOTE — ED Provider Notes (Signed)
[facility] EMERGENCY DEPARTMENT Provider Note   CSN: [id number] Arrival date & time: 11/20/17  9381     History   Chief Complaint No chief complaint on file.   HPI Caroline Blake is a 72 y.o. female.  72 yo F with a cc of a staring spell.  Husband found her in the middle of the night on the floor in the kitchen staring into space.  Not following commands.  Lasted for about 15 minutes.  Now he feels that she responds mildly slower than normal but is back to her baseline.  Denies recent illness.    The history is provided by the patient.  Illness  This is a new problem. The current episode started yesterday. The problem occurs constantly. The problem has not changed since onset.Pertinent negatives include no chest pain, no headaches and no shortness of breath. Nothing aggravates the symptoms. Nothing relieves the symptoms. She has tried nothing for the symptoms. The treatment provided no relief.    Past Medical History:  Diagnosis Date  . Anemia   . Arthritis of ankle or foot, degenerative 04/30/2016  . Atrial fibrillation ([place]) 09/2016   with left atrial appendage, failed xarelto due to GIB, unable to have watchman due to nickel allergy and Coumadin never started  . AVM (arteriovenous malformation) of colon with hemorrhage 2018   requiring transfusion --- last one was March 2018 - HgB on 10/22/2016 was 7.3 and on 10/23/2016 was 8.7  . Bacteriuria with pyuria 08/14/2015  . Benign hypertension 1995  . Cancer of kidney ([place])   . Cholelithiasis 2008  . Chronic conjunctivitis 07/04/2012  . Chronic kidney disease   . Clostridium difficile colitis 2017  . Cognitive impairment   . Colon polyp 2005   and 11/16/2016  . COPD (chronic obstructive pulmonary disease) ([place])    and currenlty smokes--- refuses to use HS home O2  . Coronary artery disease 2002  . CVA (cerebral vascular accident) ([place])   . Cystitis    INTESTINAL CHRONIC  . DDD (degenerative disc disease),  cervical   . Depression   . Diabetes mellitus ([place]) 1999   HGBA1C on 09/09/2016 was 6.8  . Disease of thyroid gland   . History of kidney cancer    LEFT  . History of pancreatitis   . History of transfusion 10/2016  . Hyperlipidemia   . Hypertension   . Hypothyroidism   . Iron deficiency anemia 09/27/2016  . Left homonymous hemianopsia 07/04/2012  . Lisfranc dislocation, right, initial encounter 08/14/2017  . Memory impairment 09/09/2016  . Nuclear sclerosis of left eye 12/09/2016   added automatically from request for surgery [phone]  . OSA (obstructive sleep apnea)    o2 3L [place] PRN  . Pancreatitis 2008  . Posterior vitreous detachment 07/04/2012  . Postsurgical hypothyroidism    S/P partial thyroidectomy 1978, later went for additional parital thyroidectomy  . Renal cell adenocarcinoma, left ([place]) 2005   [place]  . Retinal drusen 07/04/2012  . Shortness of breath   . Sinus tarsitis of left foot 09/15/2016  . Snores 2005   does not use CPAP regularly  . Stroke [facility]) 2011   LLE sensory, 2012 x2 and 2013 x1; [place]  . TIA (transient ischemic attack) 10/21/2016  . Type 2 diabetes mellitus ([place])   . Type 2 diabetes mellitus with stage 3 chronic kidney disease, with long-term current use of insulin ([place])    on 10/23/2016 Scr was 1.89, GFR 26 and K+ 4.2  Patient Active Problem List   Diagnosis Date Noted  . CAD in native artery 11/21/2017  . 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 Expressive aphasia 10/05/2017  . Solitary kidney, acquired 09/29/2017  . Metabolic acidosis, normal anion gap (NAG) 09/27/2017  . Hypernatremia 09/27/2017  . Hypokalemia 09/27/2017  . Hypocalcemia 09/27/2017  . Multiple closed fractures of foot with delayed healing, subsequent encounter 09/27/2017  . Right clavicle fracture 09/27/2017  . UTI (urinary tract infection) 09/27/2017  . Aphasia 09/15/2017  . ARF (acute renal failure) ([place]) 09/14/2017  .  Hypertensive heart disease 09/10/2017  . Hyperlipidemia 09/10/2017  . Depression 09/10/2017  . 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 Overactive bladder 09/10/2017  . Paroxysmal atrial fibrillation (HCC)   . History of CVA (cerebrovascular accident)   . History of seizure   . Vascular dementia with behavior disturbance   . Cerebral infarction ([place])   . Respiratory failure ([place])   . Goals of care, counseling/discussion   . Palliative care by specialist   . Stroke (cerebrum) ([place]) 09/01/2017  . Lisfranc dislocation, right, initial encounter 08/14/2017  . Sprain of ankle, right 01/28/2017  . Status post left cataract extraction 01/19/2017  . Anemia 10/22/2016  . Hemispheric carotid artery syndrome 10/22/2016  . Hyperglycemia due to type 2 diabetes mellitus ([place]) 10/22/2016  . Sigmoid diverticulosis 10/09/2016  . Acute kidney injury superimposed on CKD ([place]) 10/04/2016  . Iron deficiency anemia 09/27/2016  . Acute on chronic renal failure ([place]) 09/19/2016  . Bright red rectal bleeding 09/19/2016  . Nicotine abuse 09/19/2016  . Other enthesopathy of ankle and tarsus 09/15/2016  . Memory impairment 09/09/2016  . AVM (arteriovenous malformation) of colon with hemorrhage 08/09/2016  . History of kidney cancer 07/08/2016  . Arthritis of ankle or foot, degenerative, left 04/30/2016  . Anxiety and depression 02/24/2016  . Candidal vulvovaginitis 09/30/2015  . Intertrigo 09/30/2015  . Overweight with body mass index (BMI) 25.0-29.9 09/30/2015  . Status post placement of implantable loop recorder 08/19/2015  . Localization-related (focal) (partial) symptomatic epilepsy and epileptic syndromes with complex partial seizures, not intractable, without status epilepticus ([place]) 05/20/2015  . Acute cystitis 05/28/2014  . Altered mental state 05/28/2014  . Cervical spondylosis without myelopathy 05/28/2014  . Chronic interstitial cystitis 05/28/2014  . Chronic obstructive pulmonary disease  ([place]) 05/28/2014  . Chronic pancreatitis ([place]) 05/28/2014  . CVA, old, monoplegia upper limb 05/28/2014  . DDD (degenerative disc disease), lumbosacral 05/28/2014  . Degeneration of cervical intervertebral disc 05/28/2014  . Edema 05/28/2014  . Esophageal reflux 05/28/2014  . History of tobacco use 05/28/2014  . Hypoglycemia 05/28/2014  . Imbalance 05/28/2014  . Increased frequency of urination 05/28/2014  . Lower back pain 05/28/2014  . Lumbar strain 05/28/2014  . Major depressive disorder, recurrent episode, moderate ([place]) 05/28/2014  . Nausea 05/28/2014  . OSA (obstructive sleep apnea) 05/28/2014  . Osteoporosis 05/28/2014  . Pain in limb 05/28/2014  . Paronychia of left thumb 05/28/2014  . Proteinuria 05/28/2014  . Right carotid bruit 05/28/2014  . Late effects of CVA (cerebrovascular accident) 05/28/2014  . Swelling of lower limb 05/28/2014  . Thoracic and lumbosacral neuritis 05/28/2014  . Tobacco dependence 05/28/2014  . Tremor 05/28/2014  . Urethral stricture 05/28/2014  . Urinary urgency 05/28/2014  . PFO (patent foramen ovale) 01/20/2014  . Cortical cataract 07/04/2012  . Left homonymous hemianopsia 07/04/2012  . Nuclear sclerosis 07/04/2012  . Posterior vitreous detachment 07/04/2012  . Retinal drusen 07/04/2012  . Benign essential  hypertension 07/20/2011  . Family history of renal cell carcinoma 07/13/2011  . History of unilateral nephrectomy 07/13/2011  . Type 2 diabetes mellitus with stage 3 chronic kidney disease, with long-term current use of insulin ([place]) 08/09/1997    Past Surgical History:  Procedure Laterality Date  . CATARACT EXTRACTION    . CATARACT EXTRACTION W/ INTRAOCULAR LENS IMPLANT Left 01/19/2017   Procedure: PHACOEMULSIFICATION PC / IOL TOPICAL; Surgeon: Sonia Side Burden, MD; Location: [facility]; Service: Ophthalmology; Laterality: Left;  . CHOLECYSTECTOMY    . COLONOSCOPY Left 09/22/2016   Procedure: COLONOSCOPY, FLEXIBLE,  PROXIMAL TO SPLENIC FLEXURE; DIAGNOSTIC, W/WO COLLECTION SPECIMEN BY BRUSH OR [place]; Surgeon: Ned Card, MD; Location: Endo Procedures [facility]; Service: Gastroenterology  . COLONOSCOPY  11/16/2016   Procedure: COLONOSCOPY, FLEXIBLE, PROXIMAL TO SPLENIC FLEXURE; DIAGNOSTIC, W/WO COLLECTION SPECIMEN BY BRUSH OR [place]; Surgeon: Ned Card, MD; Location: Endo Procedures [facility]; Service: Gastroenterology  . COLONOSCOPY W/ BIOPSIES  10/07/2016   Procedure: COLONOSCOPY, FLEXIBLE, PROXIMAL TO SPLENIC FLEXURE; WITH BIOPSY, SINGLE OR MULTIPLE; Surgeon: Bing Quarry, MD; Location: Endo Procedures [facility]; Service: Gastroenterology  . CORONARY STENT PLACEMENT  2002  . GALLBLADDER SURGERY  2008  . LOOP RECORDER IMPLANT  2018   medtronic reveal linq  . NEPHRECTOMY Left 2005   renal cancer   . THYROIDECTOMY, PARTIAL  1978   x2  . TOTAL ABDOMINAL HYSTERECTOMY W/ BILATERAL SALPINGOOPHORECTOMY  1984  . TUBAL LIGATION  1980  . UPPER GASTROINTESTINAL ENDOSCOPY Left 09/22/2016   Procedure: UGI ENDO, INCLUDE ESOPHAGUS, STOMACH, & DUODENUM &/OR JEJUNUM; DX W/WO COLLECTION SPECIMN, BY BRUSH OR [place]; Surgeon: Ned Card, MD; Location: Endo Procedures [place]; Service: Gastroenterology     OB History    Gravida  2   Para  2   Term  2   Preterm      AB      Living        SAB      TAB      Ectopic      Multiple      Live Births               Home Medications    Prior to Admission medications   Medication Sig Start Date End Date Taking? Authorizing Provider  calcitRIOL (ROCALTROL) 0.25 MCG capsule Take 1 capsule (0.25 mcg total) by mouth daily. Patient taking differently: Take 0.5 mcg by mouth daily.  09/22/17  Yes Lavina Hamman, MD  clopidogrel (PLAVIX) 75 MG tablet Take 75 mg by mouth daily. 05/27/17  Yes [provider]  donepezil (ARICEPT) 10 MG tablet Take 1 mg by mouth daily.  05/27/17  Yes [provider]  ezetimibe (ZETIA) 10 MG tablet Take 10 mg  by mouth daily. 04/02/13  Yes [provider]  insulin aspart protamine- aspart (NOVOLOG MIX 70/30) (70-30) 100 UNIT/ML injection Inject 3 Units into the skin 2 (two) times daily.   Yes [provider]  lamoTRIgine (LAMICTAL) 100 MG tablet Take 1 tablet (100 mg total) by mouth 2 (two) times daily. 09/17/17  Yes Costello, Kayren Eaves, NP  levothyroxine (SYNTHROID, LEVOTHROID) 150 MCG tablet Take 150 mcg by mouth daily. 08/12/17  Yes [provider]  metoprolol tartrate (LOPRESSOR) 50 MG tablet Take 50 mg by mouth daily. 03/12/13  Yes [provider]  metroNIDAZOLE (METROGEL) 0.75 % vaginal gel Place 1 Applicatorful vaginally daily as needed for itching. 07/29/17  Yes [provider]  mirabegron ER (MYRBETRIQ) 50 MG TB24 tablet  Take 25 mg by mouth at bedtime.   Yes [provider]  pravastatin (PRAVACHOL) 80 MG tablet Take 80 mg by mouth daily. 05/27/17  Yes [provider]  sertraline (ZOLOFT) 100 MG tablet Take 200 mg by mouth daily. 08/09/17  Yes [provider]  traMADol (ULTRAM) 50 MG tablet Take 50 mg by mouth every 6 (six) hours as needed for moderate pain.    Yes [provider]  amLODipine (NORVASC) 5 MG tablet Take 5 mg by mouth daily.    [provider]  aspirin EC 81 MG tablet Take 1 tablet (81 mg total) by mouth daily. 11/22/17   Richardo Priest, MD    Family History Family History  Problem Relation Age of Onset  . Hyperlipidemia Mother   . Lung cancer Mother   . Aneurysm Mother        abdominal aortic  . Anuerysm Father   . Heart disease Father   . Hyperlipidemia Father   . Stroke Father   . Tremor Father        familial essential  . Hypertension Sister   . Cancer Daughter        Peritoneal carcinomatosis, surviving  . Ovarian cancer Daughter   . Aneurysm Brother        abdominal aortic  . Anesthesia problems Neg Hx   . Arthritis Neg Hx   . Asthma Neg Hx   . Cerebral palsy Neg Hx   . Clotting  disorder Neg Hx   . Deep vein thrombosis Neg Hx   . Club foot Neg Hx   . Collagen disease Neg Hx   . Diabetes Neg Hx   . Gait disorder Neg Hx   . Gout Neg Hx   . Hip dysplasia Neg Hx   . Hip fracture Neg Hx   . Hypermobility Neg Hx   . Osteoporosis Neg Hx   . Pulmonary embolism Neg Hx   . Scoliosis Neg Hx   . Spina bifida Neg Hx   . Thyroid disease Neg Hx   . Vasculitis Neg Hx   . Rheumatologic disease Neg Hx     Social History Social History   Tobacco Use  . Smoking status: Former Smoker    Packs/day: 1.00    Years: 50.00    Pack years: 50.00    Types: Cigarettes    Last attempt to quit: 09/01/2017    Years since quitting: 0.2  . Smokeless tobacco: Never Used  Substance Use Topics  . Alcohol use: No    Frequency: Never  . Drug use: Not Currently     Allergies   Atorvastatin; Carvedilol; Cinoxacin; Etodolac; Metoclopramide; Moxifloxacin; Yellow dye; Paroxetine hcl; Rosuvastatin calcium; Nitrofurantoin; Rivaroxaban; Sulfa antibiotics; Yellow dyes (non-tartrazine); Aspirin-dipyridamole er; Ezetimibe-simvastatin; and Pitavastatin   Review of Systems Review of Systems  Constitutional: Negative for chills and fever.  HENT: Negative for congestion and rhinorrhea.   Eyes: Negative for redness and visual disturbance.  Respiratory: Negative for shortness of breath and wheezing.   Cardiovascular: Negative for chest pain and palpitations.  Gastrointestinal: Negative for nausea and vomiting.  Genitourinary: Negative for dysuria and urgency.  Musculoskeletal: Negative for arthralgias and myalgias.  Skin: Negative for pallor and wound.  Neurological: Negative for dizziness and headaches.       Staring spell     Physical Exam Updated Vital Signs BP (!) 151/130   Pulse 64   Temp 97.7 F (36.5 C)   Resp 18   Ht 5\' 4"  (  1.626 m)   Wt 63.5 kg (140 lb)   SpO2 94%   BMI 24.03 kg/m   Physical Exam  Constitutional: She is oriented to person, place, and time. She appears  well-developed and well-nourished. No distress.  HENT:  Head: Normocephalic and atraumatic.  Eyes: Pupils are equal, round, and reactive to light. EOM are normal.  Neck: Normal range of motion. Neck supple.  Cardiovascular: Normal rate and regular rhythm. Exam reveals no gallop and no friction rub.  No murmur heard. Pulmonary/Chest: Effort normal. She has no wheezes. She has no rales.  Abdominal: Soft. She exhibits no distension. There is no tenderness.  Musculoskeletal: She exhibits no edema or tenderness.  Neurological: She is alert and oriented to person, place, and time. No cranial nerve deficit or sensory deficit. GCS eye subscore is 4. GCS verbal subscore is 4. GCS motor subscore is 6. She displays no Babinski's sign on the right side. She displays no Babinski's sign on the left side.  LUE weakness 4/5 compared to R 5/5 with flexion, extension and grip.   Skin: Skin is warm and dry. She is not diaphoretic.  Psychiatric: She has a normal mood and affect. Her behavior is normal.  Nursing note and vitals reviewed.    ED Treatments / Results  Labs (all labs ordered are listed, but only abnormal results are displayed) Labs Reviewed  CBC - Abnormal; Notable for the following components:      Result Value   Hemoglobin 11.4 (*)    RDW 17.5 (*)    All other components within normal limits  COMPREHENSIVE METABOLIC PANEL - Abnormal; Notable for the following components:   Potassium 5.3 (*)    CO2 19 (*)    Glucose, Bld 114 (*)    BUN 66 (*)    Creatinine, Ser 2.55 (*)    Calcium 8.1 (*)    Total Protein 6.4 (*)    GFR calc non Af Amer 18 (*)    GFR calc Af Amer 21 (*)    All other components within normal limits  URINALYSIS, ROUTINE W REFLEX MICROSCOPIC - Abnormal; Notable for the following components:   Color, Urine STRAW (*)    Protein, ur 30 (*)    Squamous Epithelial / LPF 0-5 (*)    All other components within normal limits  I-STAT CHEM 8, ED - Abnormal; Notable for the  following components:   Potassium 5.3 (*)    BUN 58 (*)    Creatinine, Ser 2.70 (*)    Glucose, Bld 109 (*)    Calcium, Ion 1.04 (*)    Hemoglobin 11.6 (*)    HCT 34.0 (*)    All other components within normal limits  ETHANOL  PROTIME-INR  APTT  DIFFERENTIAL  RAPID URINE DRUG SCREEN, HOSP PERFORMED  I-STAT TROPONIN, ED    EKG EKG Interpretation  Date/Time:  Sunday November 20 2017 04:40:22 EDT Ventricular Rate:  72 PR Interval:    QRS Duration: 92 QT Interval:  424 QTC Calculation: 464 R Axis:   -21 Text Interpretation:  Sinus rhythm Borderline left axis deviation Baseline wander in lead(s) V2 No significant change since last tracing Confirmed by Deno Etienne [phone]) on 11/20/2017 5:22:14 AM Also confirmed by Deno Etienne [phone]), editor Philomena Doheny [phone])  on 11/20/2017 7:51:39 AM   Radiology No results found.  Procedures Procedures (including critical care time)  Medications Ordered in ED Medications - No data to display   Initial Impression / Assessment and Plan / ED  Course  I have reviewed the triage vital signs and the nursing notes.  Pertinent labs & imaging results that were available during my care of the patient were reviewed by me and considered in my medical decision making (see chart for details).     72 yo F with a cc of a staring spell.  ? Seizure activity without shaking, no hx of seizures.  On lamictal.  Family is unsure why.  Discussed with Dr. Lorraine Lax, he is unsure what to make of the activity, she does have new L arm weakness, recommends MRI if negative, outpatient neuro follow up for possible seizure like activity. Care turned over to Dr. Laverta Baltimore, please see his note for further details of ED care.   The patients results and plan were reviewed and discussed.   Any x-rays performed were independently reviewed by myself.   Differential diagnosis were considered with the presenting HPI.  Medications - No data to display  Vitals:   11/20/17 0834 11/20/17  0845 11/20/17 0900 11/20/17 0930  BP: 136/79 130/86 (!) 140/94 (!) 151/130  Pulse: 62 67 63 64  Resp: 19 19 11 18   Temp:      SpO2: 97% 94% 97% 94%  Weight:      Height:        Final diagnoses:  Cerebrovascular accident (CVA) due to embolism of cerebral artery ([place])      Final Clinical Impressions(s) / ED Diagnoses   Final diagnoses:  Cerebrovascular accident (CVA) due to embolism of cerebral artery [facility])    ED Discharge Orders        Ordered    albuterol (PROVENTIL HFA;VENTOLIN HFA) 108 (90 Base) MCG/ACT inhaler  Every 6 hours PRN,   Status:  Discontinued     11/20/17 0712    predniSONE (DELTASONE) 20 MG tablet  Daily,   Status:  Discontinued     11/20/17 0712    azithromycin (ZITHROMAX) 250 MG tablet  Daily,   Status:  Discontinued     11/20/17 [place], [place], DO 11/23/17 9675

## 2017-11-20 NOTE — ED Notes (Signed)
Dr. Laverta Baltimore at bedside at this time.

## 2017-11-20 NOTE — Consult Note (Signed)
Requesting Physician: Dr. Tyrone Nine    Chief Complaint: Fall, unresponsive  History obtained from: Patient and Chart     HPI:                                                                                                                                       Caroline Blake is an 72 y.o. female with very complicated past medical history. She has a history of CAD status post stent, COPD, smoker, hypertension, kidney cancer status post unilateral nephrectomy. She also has history of a stroke with right temporal and left subcortical infarcts. Her loop recorder inserted and found to have A. fib in 09/2016. Put on Xarelto. However, patient started to have GI bleeding with anemia requiring for blood transfusion. 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 No more GI bleeding since summer. However, Xarelto discontinued, currently on Plavix PTA. She also has a seizure in 04/2013 and was on Lamictal 100 mg twice daily since. She had another stroke in Jan 24/2019 involving left cerebellum, left MCA, right punctate MCA. However after discussion with family, anticoagulation was not pursued and patient on Plavix.    She presented this time after husband heard a thud around 3 am and noted she was on the floor of kitchen next to fridge. He tried to get her up, however she seemed to stare at him for few min, not following commands and didn't answer questions. He called EMS and on arrival to [place] When assessed by EDP noted to have left side weakness.  Neurology was consulted, however symptoms have improved.    Past Medical History:  Diagnosis Date  . Anemia   . Arthritis of ankle or foot, degenerative 04/30/2016  . Atrial fibrillation ([place]) 09/2016   with left atrial appendage, failed xarelto due to GIB, unable to have watchman due to nickel allergy and Coumadin never started  . AVM (arteriovenous malformation) of colon with hemorrhage 2018    requiring transfusion --- last one was March 2018 - HgB on 10/22/2016 was 7.3 and on 10/23/2016 was 8.7  . Bacteriuria with pyuria 08/14/2015  . Benign hypertension 1995  . Cancer of kidney ([place])   . Cholelithiasis 2008  . Chronic conjunctivitis 07/04/2012  . Chronic kidney disease   . Clostridium difficile colitis 2017  . Cognitive impairment   . Colon polyp 2005   and 11/16/2016  . COPD (chronic obstructive pulmonary disease) ([place])    and currenlty smokes--- refuses to use HS home O2  . Coronary artery disease 2002  . CVA (cerebral vascular accident) ([place])   . Cystitis    INTESTINAL CHRONIC  . DDD (degenerative disc disease), cervical   . Depression   . Diabetes mellitus ([place]) 1999   HGBA1C on 09/09/2016 was 6.8  . Disease of thyroid gland   . History of kidney cancer  LEFT  . History of pancreatitis   . History of transfusion 10/2016  . Hyperlipidemia   . Hypertension   . Hypothyroidism   . Iron deficiency anemia 09/27/2016  . Left homonymous hemianopsia 07/04/2012  . Lisfranc dislocation, right, initial encounter 08/14/2017  . Memory impairment 09/09/2016  . Nuclear sclerosis of left eye 12/09/2016   added automatically from request for surgery [phone]  . OSA (obstructive sleep apnea)    o2 3L [place] PRN  . Pancreatitis 2008  . Posterior vitreous detachment 07/04/2012  . Postsurgical hypothyroidism    S/P partial thyroidectomy 1978, later went for additional parital thyroidectomy  . Renal cell adenocarcinoma, left ([place]) 2005   [place]  . Retinal drusen 07/04/2012  . Shortness of breath   . Sinus tarsitis of left foot 09/15/2016  . Snores 2005   does not use CPAP regularly  . Stroke [facility]) 2011   LLE sensory, 2012 x2 and 2013 x1; [place]  . TIA (transient ischemic attack) 10/21/2016  . Type 2 diabetes mellitus ([place])   . Type 2 diabetes mellitus with stage 3 chronic kidney disease, with long-term current use of insulin ([place])    on 10/23/2016 Scr was 1.89, GFR 26 and K+ 4.2     Past Surgical History:  Procedure Laterality Date  . CATARACT EXTRACTION    . CATARACT EXTRACTION W/ INTRAOCULAR LENS IMPLANT Left 01/19/2017   Procedure: PHACOEMULSIFICATION PC / IOL TOPICAL; Surgeon: Sonia Side Burden, MD; Location: [place]; Service: Ophthalmology; Laterality: Left;  . CHOLECYSTECTOMY    . COLONOSCOPY Left 09/22/2016   Procedure: COLONOSCOPY, FLEXIBLE, PROXIMAL TO SPLENIC FLEXURE; DIAGNOSTIC, W/WO COLLECTION SPECIMEN BY [facility] OR [place]; Surgeon: Ned Card, MD; Location: Endo Procedures [facility]; Service: Gastroenterology  . COLONOSCOPY  11/16/2016   Procedure: COLONOSCOPY, FLEXIBLE, PROXIMAL TO SPLENIC FLEXURE; DIAGNOSTIC, W/WO COLLECTION SPECIMEN BY [facility] OR [place]; Surgeon: Ned Card, MD; Location: Endo Procedures [facility]; Service: Gastroenterology  . COLONOSCOPY W/ BIOPSIES  10/07/2016   Procedure: COLONOSCOPY, FLEXIBLE, PROXIMAL TO SPLENIC FLEXURE; WITH BIOPSY, SINGLE OR MULTIPLE; Surgeon: Bing Quarry, MD; Location: Endo Procedures [facility]; Service: Gastroenterology  . CORONARY STENT PLACEMENT  2002  . GALLBLADDER SURGERY  2008  . LOOP RECORDER IMPLANT  2018   medtronic reveal linq  . NEPHRECTOMY Left 2005   renal cancer   . THYROIDECTOMY, PARTIAL  1978   x2  . TOTAL ABDOMINAL HYSTERECTOMY W/ BILATERAL SALPINGOOPHORECTOMY  1984  . TUBAL LIGATION  1980  . UPPER GASTROINTESTINAL ENDOSCOPY Left 09/22/2016   Procedure: UGI ENDO, INCLUDE ESOPHAGUS, STOMACH, & DUODENUM &/OR JEJUNUM; DX W/WO COLLECTION SPECIMN, BY [facility] OR [place]; Surgeon: Ned Card, MD; Location: Endo Procedures [place]; Service: Gastroenterology    Family History  Problem Relation Age of Onset  . Hyperlipidemia Mother   . Lung cancer Mother   . Aneurysm Mother        abdominal aortic  . Anuerysm Father   . Heart disease Father   . Hyperlipidemia Father   . Stroke Father   . Tremor Father        familial essential  . Hypertension Sister   . Cancer Daughter         Peritoneal carcinomatosis, surviving  . Ovarian cancer Daughter   . Aneurysm Brother        abdominal aortic  . Anesthesia problems Neg Hx   . Arthritis Neg Hx   . Asthma Neg Hx   . Cerebral palsy Neg Hx   . Clotting disorder Neg Hx   .  Deep vein thrombosis Neg Hx   . Club foot Neg Hx   . Collagen disease Neg Hx   . Diabetes Neg Hx   . Gait disorder Neg Hx   . Gout Neg Hx   . Hip dysplasia Neg Hx   . Hip fracture Neg Hx   . Hypermobility Neg Hx   . Osteoporosis Neg Hx   . Pulmonary embolism Neg Hx   . Scoliosis Neg Hx   . Spina bifida Neg Hx   . Thyroid disease Neg Hx   . Vasculitis Neg Hx   . Rheumatologic disease Neg Hx    Social History:  reports that she quit smoking about 12 months ago. Her smoking use included cigarettes. She has a 50.00 pack-year smoking history. She has never used smokeless tobacco. She reports that she does not drink alcohol. Her drug history is not on file.  Allergies:  Allergies  Allergen Reactions  . Atorvastatin Itching and Other (See Comments)    Myopathy also   . Carvedilol Itching  . Cinoxacin Itching and Other (See Comments)    Hypotension also  . Etodolac Other (See Comments), Itching and Hives  . Metoclopramide Itching  . Moxifloxacin Itching  . Yellow Dye Hives  . Paroxetine Hcl Other (See Comments)    Unknown reaction  . Rosuvastatin Calcium Other (See Comments)    Myalgias  . Nitrofurantoin Other (See Comments)    Yeast infection  . Rivaroxaban Other (See Comments)    GI bleed  . Sulfa Antibiotics Hives and Itching  . Yellow Dyes (Non-Tartrazine) Other (See Comments)    unspecified  . Aspirin-Dipyridamole Er Itching  . Ezetimibe-Simvastatin Itching  . Pitavastatin Itching    Medications:                                                                                                                        I reviewed home medications   ROS:                                                                                                                                      14 systems reviewed and negative except above    Examination:  General: Appears well-developed and well-nourished.  Psych: Affect appropriate to situation Eyes: No scleral injection HENT: No OP obstrucion Head: Normocephalic.  Cardiovascular: Normal rate and regular rhythm.  Respiratory: Effort normal and breath sounds normal to anterior ascultation GI: Soft.  No distension. There is no tenderness.  Skin: WDI   Neurological Examination Mental Status: Alert, oriented, thought content appropriate.  Speech fluent without evidence of aphasia. Able to follow 3 step commands without difficulty. Cranial Nerves: II: Visual fields grossly normal,  III,IV, VI: ptosis not present, extra-ocular motions intact bilaterally, pupils equal, round, reactive to light and accommodation V,VII: smile symmetric, facial light touch sensation normal bilaterally VIII: hearing normal bilaterally IX,X: uvula rises symmetrically XI: bilateral shoulder shrug XII: midline tongue extension Motor: Right : Upper extremity   5/5    Left:     Upper extremity   5/5  Lower extremity   5/5     Lower extremity   5/5 Tone and bulk:normal tone throughout; no atrophy noted Sensory: Pinprick and light touch intact throughout, bilaterally Deep Tendon Reflexes: symmetric bilaterally Plantars: Right: downgoing   Left: downgoing Cerebellar: normal finger-to-nose, normal rapid alternating movements and normal heel-to-shin test Gait: normal gait and station     Lab Results: Basic Metabolic Panel: Recent Labs  Lab 11/20/17 0444 11/20/17 0449  NA 136 138  K 5.3* 5.3*  CL 107 106  CO2 19*  --   GLUCOSE 114* 109*  BUN 66* 58*  CREATININE 2.55* 2.70*  CALCIUM 8.1*  --     CBC: Recent Labs  Lab 11/20/17 0444 11/20/17 0449  WBC 8.6  --   NEUTROABS 6.0  --    HGB 11.4* 11.6*  HCT 36.5 34.0*  MCV 85.1  --   PLT 311  --     Coagulation Studies: Recent Labs    11/20/17 0444  LABPROT 13.4  INR 1.03    Imaging: Ct Head Wo Contrast  Result Date: 11/20/2017 CLINICAL DATA:  Neuro deficit, subacute. Fall today, stroke-like symptoms. EXAM: CT HEAD WITHOUT CONTRAST TECHNIQUE: Contiguous axial images were obtained from the base of the skull through the vertex without intravenous contrast. COMPARISON:  Head CT and brain MRI 09/14/2017 and 09/15/2017 respectively FINDINGS: Brain: No intracranial hemorrhage. Expected evolution of prior infarcts in bilateral cerebellum and throughout the left hemisphere on prior exam. More remote encephalomalacia in the right temporoparietal lobe is unchanged. Remote lacunar infarcts in left basal ganglia are unchanged. Stable degree of atrophy and chronic small vessel ischemia. No evidence of acute ischemia. No hydrocephalus, mass effect, or midline shift. Vascular: Atherosclerosis of skullbase vasculature without hyperdense vessel or abnormal calcification. Skull: No fracture or focal lesion. Sinuses/Orbits: No acute finding.  Prior left cataract resection. Other: None. IMPRESSION: 1. Expected evolution of ischemic changes throughout bilateral cerebellum and left cerebral hemisphere from prior exams. No evidence of acute ischemia. No hemorrhage. 2. Stable degree of atrophy, chronic small vessel ischemia, and encephalomalacia in the right temporal and parietal lobes. Electronically Signed   By: Jeb Levering M.D.   On: 11/20/2017 05:15     ASSESSMENT AND PLAN   72 y.o. female with pAfib not on AC due to GI bleeding from angiodysplasia s/p cauterization. Had a fall followed by being being confused and noted to have left side weakness which has since resolved.     TIA vs Stroke vs Syncope  Obtain MRI head No further stroke workup needed as patient had workup done recently. Etiology of stroke is Afib-  will need  to discuss  whether are not to restart anticoagulation with family.  Shuaib Corsino [facility] Pager Number [phone]

## 2017-11-20 NOTE — ED Notes (Signed)
Patient transported to MRI 

## 2017-11-20 NOTE — ED Notes (Signed)
Patient transported to CT 

## 2017-11-20 NOTE — Discharge Instructions (Signed)
You were seen in the ED today and found to have had another stroke. The Neurologist recommends starting anticoagulation. You need to discuss the risks/benefits of this with your Cardiologist and Neurologist. The Neurology group is ok with your returning home at this time. Return to the ED with any new or worsening symptoms.

## 2017-11-20 NOTE — ED Triage Notes (Signed)
Pt to ED via [facility] with stroke like symptoms. LKW 2200 when pt went to bed. 0300 pt up and had wet self, went to kitchen and husband heard thud and found her on the floor with wild stare in eyes. Hx of 6 strokes, ems unsure of existing deficits vs. New symptoms.

## 2017-11-20 NOTE — ED Notes (Signed)
Patient currently in MRI.

## 2017-11-21 DIAGNOSIS — I2510 Atherosclerotic heart disease of native coronary artery without angina pectoris: Secondary | ICD-10-CM | POA: Insufficient documentation

## 2017-11-22 ENCOUNTER — Encounter: Payer: Self-pay | Admitting: Cardiology

## 2017-11-22 ENCOUNTER — Ambulatory Visit: Payer: Medicare Other | Admitting: Cardiology

## 2017-11-22 VITALS — BP 152/80 | HR 91 | Ht 64.5 in | Wt 131.0 lb

## 2017-11-22 DIAGNOSIS — I480 Paroxysmal atrial fibrillation: Secondary | ICD-10-CM | POA: Diagnosis not present

## 2017-11-22 DIAGNOSIS — I63419 Cerebral infarction due to embolism of unspecified middle cerebral artery: Secondary | ICD-10-CM

## 2017-11-22 DIAGNOSIS — I119 Hypertensive heart disease without heart failure: Secondary | ICD-10-CM | POA: Diagnosis not present

## 2017-11-22 DIAGNOSIS — I2510 Atherosclerotic heart disease of native coronary artery without angina pectoris: Secondary | ICD-10-CM | POA: Diagnosis not present

## 2017-11-22 MED ORDER — ASPIRIN EC 81 MG PO TBEC: 81.0000 mg | DELAYED_RELEASE_TABLET | Freq: Every day | ORAL | 0 refills | Status: DC

## 2017-11-22 NOTE — Progress Notes (Signed)
Cardiology Office Note:    Date:  11/22/2017   ID:  LEN Caroline Blake, DOB [birth year], MRN [id number]  PCP:  Drake Leach, MD  Cardiologist:  Shirlee More, MD   Referring MD: Drake Leach, MD  ASSESSMENT:    1. Paroxysmal atrial fibrillation (HCC)   2. CAD in native artery   3. Cerebrovascular accident (CVA) due to embolism of middle cerebral artery, unspecified blood vessel laterality ([place])   4. Hypertensive heart disease without heart failure    PLAN:    In order of problems listed above:  1. Clinical problem here is recurrent stroke inability to be anticoagulated she was advised anticoagulation emergency room and will see her in the office is clear that she will not accepted because of the severity of her rectal bleeding in the past with a direct anticoagulant.  She is allergic to nickel and was advised she could not have the watchman procedure.  I have asked her in the interim to take both aspirin 81 mg daily which she takes sporadically along with clopidogrel daily and will refer her to Dr. Denman George [facility] for consideration of Lariat device which does not require anticoagulation.  Patient and her husband voiced understanding and agreement I offered to place her on Eliquis in the interim and they have declined.  She will continue other guideline directed therapy including a statin 2. Stable asymptomatic at this time I would not pursue an ischemia evaluation 3. Recent recurrent stroke they tell me this the sixth that she sustained and clearly in the setting of paroxysmal atrial fibrillation and failure with dual antiplatelet therapy requires lariat device. 4. Stable continue current medical treatment including calcium channel blocker beta-blocker statin  Next appointment as needed as needed    Medication Adjustments/Labs and Tests Ordered: Current medicines are reviewed at length with the patient today.  Concerns regarding medicines are outlined above.  No orders of the  defined types were placed in this encounter.  Meds ordered this encounter  Medications  . aspirin EC 81 MG tablet    Sig: Take 1 tablet (81 mg total) by mouth daily.    Dispense:  30 tablet    Refill:  0     Chief Complaint  Patient presents with  . Hospitalization Follow-up  . Atrial Fibrillation    with recent stroke on DAPT  . Coronary Artery Disease  . Hypertension  . Hyperlipidemia  . GI Bleeding  . Anticoagulation    History of Present Illness:    Caroline Blake is a 72 year old woman with a history of CAD status post stent, COPD, smoker, hypertension, kidney cancer status post unilateral nephrectomy. She also has history of a stroke with right temporal and left subcortical infarcts. Her loop recorder inserted and found to have A. fib in 09/2016. Put on Xarelto. However, patient started to have GI bleeding with anemia requiring for blood transfusion. 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  She is being seen today for the evaluation of atrial fibrillation at the request of Drake Leach, MD.  She was seen at [facility] ED 11/20/17: Spoke with Dr. Sandrea Matte who has seen the patient and reviewed the MRI. He advises anticoagulation to prevent further CVA. Patient has wanted Cardiology to make this decision in the past. No bleeding from AVMs since treatment in the past. Also advises outpatient EEG. He advises no indication for admission as she has had two prior  admission for w/u.   I have reviewed and discussed all results (EKG, imaging, lab, urine as appropriate), exam findings with patient. I have reviewed nursing notes and appropriate previous records.  I feel the patient is safe to be discharged home without further emergent workup. Discussed usual and customary return precautions. Patient and family (if present) verbalize understanding and are comfortable with this plan.  Patient will follow-up with their primary  care provider. If they do not have a primary care provider, information for follow-up has been provided to them. All questions have been answered. Nanda Quinton, MD  She was seen by EP at [facility]: Mahala Menghini, MD - 01/06/2017 10:15 AM EDT  History of Present Illness: Caroline Blake is a 72 y.o. female with past medical history of known coronary artery disease,CVA with sequela, COPD, hypertension, history of loop implant in order to detect atrial fibrillation after she has had stroke GI bleeding, unable to take anticoagulation because Xarelto cause GI bleeding. She is presently just on Plavix .We discussed with her about the warfarin.She was here today in order to discuss about the watchman,but however after discussing with her we find out that she has allergy to nickel. So watchman has made of night and old that is almost Nickel. So she is allergy because of that she is not candidate for watchman implant.. Since she has atrial flutter detected on her device and also atrial fibrillation. possibility in future we start her on Eliquis since Xarelto cause her bleeding . ICD-10-CM  1. Anticoagulated on Coumadin Z51.81  Z79.01  2. Paroxysmal atrial fibrillation (CMS-HCC) I48.0  3. Transient cerebral ischemia, unspecified type G45.9  4. 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 Coronary artery disease involving native heart without angina pectoris, unspecified vessel or lesion type I25.10  6. Benign essential hypertension (RAF-HCC) I10  7. Chronic obstructive pulmonary disease, unspecified COPD type (CMS-HCC) J44.9  8. OSA (obstructive sleep apnea) G47.33  9. Chronic pancreatitis, unspecified pancreatitis type (CMS-HCC) K86.1  10. Uncontrolled type 2 diabetes mellitus with complication, without long-term current use of insulin (CMS-HCC) E11.8  E11.65  11. Hypothyroidism, unspecified type E03.9  12. Acute kidney injury superimposed on CKD (CMS-HCC) N17.9  N18.9  that was last seen within the  last 3 months.. Assessment & Plan:  1. Anticoagulated on Coumadin  2. Paroxysmal atrial fibrillation (CMS-HCC)  3. Transient cerebral ischemia, unspecified type  4. Hemispheric carotid artery syndrome  5. Coronary artery disease involving native heart without angina pectoris, unspecified vessel or lesion type  6. Benign essential hypertension (RAF-HCC)  7. Chronic obstructive pulmonary disease, unspecified COPD type (CMS-HCC)  8. OSA (obstructive sleep apnea)  9. Chronic pancreatitis, unspecified pancreatitis type (CMS-HCC)  10. Uncontrolled type 2 diabetes mellitus with complication, without long-term current use of insulin (CMS-HCC)  11. Hypothyroidism, unspecified type  12. Acute kidney injury superimposed on CKD (CMS-HCC)  PLAN: I discussed with her unfortunately she is not candidate for watchman implant because she is allergy to nickel,at least at this point continue with same management. Follow-up with [place] in 4 months. Discussed with her in future possibility start her on Elquis because since she bled with the Xarelto.  She is seen in the office along with her husband understand she is having recurrent embolic strokes and they will not accept another trial of anticoagulant therapy with previous life-threatening GI bleeding due to colon AV malformation.  They ask for other options we discussed watchman but she is allergic to nickel.  I have asked her  to schedule appointment with Dr. Wylene Men [facility] to explore lariat device.  Her CAD is stable having no chest pain or medical problems hypertension dyslipidemia are treated and stable and hopefully she will be seen very quickly by him and they agree to travel to [facility].  Her plan a loop recorder is followed by another practice and I am not can address that issue today   Past Medical History:  Diagnosis Date  . Anemia   . Arthritis of ankle or foot, degenerative 04/30/2016  . Atrial fibrillation ([place]) 09/2016   with left atrial  appendage, failed xarelto due to GIB, unable to have watchman due to nickel allergy and Coumadin never started  . AVM (arteriovenous malformation) of colon with hemorrhage 2018   requiring transfusion --- last one was March 2018 - HgB on 10/22/2016 was 7.3 and on 10/23/2016 was 8.7  . Bacteriuria with pyuria 08/14/2015  . Benign hypertension 1995  . Cancer of kidney ([place])   . Cholelithiasis 2008  . Chronic conjunctivitis 07/04/2012  . Chronic kidney disease   . Clostridium difficile colitis 2017  . Cognitive impairment   . Colon polyp 2005   and 11/16/2016  . COPD (chronic obstructive pulmonary disease) ([place])    and currenlty smokes--- refuses to use HS home O2  . Coronary artery disease 2002  . CVA (cerebral vascular accident) ([place])   . Cystitis    INTESTINAL CHRONIC  . DDD (degenerative disc disease), cervical   . Depression   . Diabetes mellitus ([place]) 1999   HGBA1C on 09/09/2016 was 6.8  . Disease of thyroid gland   . History of kidney cancer    LEFT  . History of pancreatitis   . History of transfusion 10/2016  . Hyperlipidemia   . Hypertension   . Hypothyroidism   . Iron deficiency anemia 09/27/2016  . Left homonymous hemianopsia 07/04/2012  . Lisfranc dislocation, right, initial encounter 08/14/2017  . Memory impairment 09/09/2016  . Nuclear sclerosis of left eye 12/09/2016   added automatically from request for surgery [phone]  . OSA (obstructive sleep apnea)    o2 3L [place] PRN  . Pancreatitis 2008  . Posterior vitreous detachment 07/04/2012  . Postsurgical hypothyroidism    S/P partial thyroidectomy 1978, later went for additional parital thyroidectomy  . Renal cell adenocarcinoma, left ([place]) 2005   [place]  . Retinal drusen 07/04/2012  . Shortness of breath   . Sinus tarsitis of left foot 09/15/2016  . Snores 2005   does not use CPAP regularly  . Stroke [facility]) 2011   LLE sensory, 2012 x2 and 2013 x1; [place]  . TIA (transient ischemic attack) 10/21/2016  . Type 2  diabetes mellitus ([place])   . Type 2 diabetes mellitus with stage 3 chronic kidney disease, with long-term current use of insulin ([place])    on 10/23/2016 Scr was 1.89, GFR 26 and K+ 4.2    Past Surgical History:  Procedure Laterality Date  . CATARACT EXTRACTION    . CATARACT EXTRACTION W/ INTRAOCULAR LENS IMPLANT Left 01/19/2017   Procedure: PHACOEMULSIFICATION PC / IOL TOPICAL; Surgeon: Sonia Side Burden, MD; Location: [place]; Service: Ophthalmology; Laterality: Left;  . CHOLECYSTECTOMY    . COLONOSCOPY Left 09/22/2016   Procedure: COLONOSCOPY, FLEXIBLE, PROXIMAL TO SPLENIC FLEXURE; DIAGNOSTIC, W/WO COLLECTION SPECIMEN BY BRUSH OR [place]; Surgeon: Ned Card, MD; Location: Endo Procedures [facility]; Service: Gastroenterology  . COLONOSCOPY  11/16/2016   Procedure: COLONOSCOPY, FLEXIBLE, PROXIMAL TO SPLENIC FLEXURE; DIAGNOSTIC, W/WO  COLLECTION SPECIMEN BY BRUSH OR [place]; Surgeon: Ned Card, MD; Location: Endo Procedures [facility]; Service: Gastroenterology  . COLONOSCOPY W/ BIOPSIES  10/07/2016   Procedure: COLONOSCOPY, FLEXIBLE, PROXIMAL TO SPLENIC FLEXURE; WITH BIOPSY, SINGLE OR MULTIPLE; Surgeon: Bing Quarry, MD; Location: Endo Procedures [facility]; Service: Gastroenterology  . CORONARY STENT PLACEMENT  2002  . GALLBLADDER SURGERY  2008  . LOOP RECORDER IMPLANT  2018   medtronic reveal linq  . NEPHRECTOMY Left 2005   renal cancer   . THYROIDECTOMY, PARTIAL  1978   x2  . TOTAL ABDOMINAL HYSTERECTOMY W/ BILATERAL SALPINGOOPHORECTOMY  1984  . TUBAL LIGATION  1980  . UPPER GASTROINTESTINAL ENDOSCOPY Left 09/22/2016   Procedure: UGI ENDO, INCLUDE ESOPHAGUS, STOMACH, & DUODENUM &/OR JEJUNUM; DX W/WO COLLECTION SPECIMN, BY BRUSH OR [place]; Surgeon: Ned Card, MD; Location: Endo Procedures [place]; Service: Gastroenterology    Current Medications: Current Meds  Medication Sig  . calcitRIOL (ROCALTROL) 0.25 MCG capsule Take 1 capsule (0.25 mcg total) by mouth daily.  (Patient taking differently: Take 0.5 mcg by mouth daily. )  . clopidogrel (PLAVIX) 75 MG tablet Take 75 mg by mouth daily.  Marland Kitchen donepezil (ARICEPT) 10 MG tablet Take 1 mg by mouth daily.   Marland Kitchen ezetimibe (ZETIA) 10 MG tablet Take 10 mg by mouth daily.  . insulin aspart protamine- aspart (NOVOLOG MIX 70/30) (70-30) 100 UNIT/ML injection Inject 3 Units into the skin 2 (two) times daily.  Marland Kitchen lamoTRIgine (LAMICTAL) 100 MG tablet Take 1 tablet (100 mg total) by mouth 2 (two) times daily.  Marland Kitchen levothyroxine (SYNTHROID, LEVOTHROID) 150 MCG tablet Take 150 mcg by mouth daily.  . metoprolol tartrate (LOPRESSOR) 50 MG tablet Take 50 mg by mouth daily.  . metroNIDAZOLE (METROGEL) 0.75 % vaginal gel Place 1 Applicatorful vaginally daily as needed for itching.  . mirabegron ER (MYRBETRIQ) 50 MG TB24 tablet Take 25 mg by mouth at bedtime.  . pravastatin (PRAVACHOL) 80 MG tablet Take 80 mg by mouth daily.  . sertraline (ZOLOFT) 100 MG tablet Take 200 mg by mouth daily.  . traMADol (ULTRAM) 50 MG tablet Take 50 mg by mouth every 6 (six) hours as needed for moderate pain.      Allergies:   Atorvastatin; Carvedilol; Cinoxacin; Etodolac; Metoclopramide; Moxifloxacin; Yellow dye; Paroxetine hcl; Rosuvastatin calcium; Nitrofurantoin; Rivaroxaban; Sulfa antibiotics; Yellow dyes (non-tartrazine); Aspirin-dipyridamole er; Ezetimibe-simvastatin; and Pitavastatin   Social History   Socioeconomic History  . Marital status: Married    Spouse name: Not on file  . Number of children: Not on file  . Years of education: Not on file  . Highest education level: Not on file  Occupational History  . Not on file  Social Needs  . Financial resource strain: Not on file  . Food insecurity:    Worry: Not on file    Inability: Not on file  . Transportation needs:    Medical: Not on file    Non-medical: Not on file  Tobacco Use  . Smoking status: Former Smoker    Packs/day: 1.00    Years: 50.00    Pack years: 50.00    Types:  Cigarettes    Last attempt to quit: 09/01/2017    Years since quitting: 0.2  . Smokeless tobacco: Never Used  Substance and Sexual Activity  . Alcohol use: No    Frequency: Never  . Drug use: Not Currently  . Sexual activity: Never  Lifestyle  . Physical activity:    Days per week: Not on file  Minutes per session: Not on file  . Stress: Not on file  Relationships  . Social connections:    Talks on phone: Not on file    Gets together: Not on file    Attends religious service: Not on file    Active member of club or organization: Not on file    Attends meetings of clubs or organizations: Not on file    Relationship status: Not on file  Other Topics Concern  . Not on file  Social History Narrative  . Not on file     Family History: The patient's family history includes Aneurysm in her brother and mother; Anuerysm in her father; Cancer in her daughter; Heart disease in her father; Hyperlipidemia in her father and mother; Hypertension in her sister; Lung cancer in her mother; Ovarian cancer in her daughter; Stroke in her father; Tremor in her father. There is no history of Anesthesia problems, Arthritis, Asthma, Cerebral palsy, Clotting disorder, Deep vein thrombosis, Club foot, Collagen disease, Diabetes, Gait disorder, Gout, Hip dysplasia, Hip fracture, Hypermobility, Osteoporosis, Pulmonary embolism, Scoliosis, Spina bifida, Thyroid disease, Vasculitis, or Rheumatologic disease.  ROS:   ROS Please see the history of present illness.     All other systems reviewed and are negative.  EKGs/Labs/Other Studies Reviewed:    The following studies were reviewed today: EKG Interpretation Date/Time:                  Sunday November 20 2017 04:40:22 EDT Ventricular Rate:         72 PR Interval:                   QRS Duration: 92 QT Interval:                 424 QTC Calculation:        46 4 R Axis:                         -21 Text Interpretation:       Sinus rhythm Borderline left axis  deviation Baseline wander in lead(s) V2 No significant change since last tracing Confirmed by Deno Etienne [phone]) on 11/20/2017 5:22:14 AM   Echo TTE 09/02/17: Study Conclusions - Left ventricle: The cavity size was normal. Systolic function was   normal. The estimated ejection fraction was in the range of 55%   to 60%. Wall motion was normal; there were no regional wall   motion abnormalities. Doppler parameters are consistent with   abnormal left ventricular relaxation (grade 1 diastolic   dysfunction). Both the LA and RA are normal in size Recent Labs: 09/19/2017: Magnesium 2.5 11/20/2017: ALT 14; BUN 58; Creatinine, Ser 2.70; Hemoglobin 11.6; Platelets 311; Potassium 5.3; Sodium 138  Recent Lipid Panel    Component Value Date/Time   CHOL 156 09/02/2017 0212   TRIG 41 09/02/2017 0212   HDL 47 09/02/2017 0212   CHOLHDL 3.3 09/02/2017 0212   VLDL 8 09/02/2017 0212   LDLCALC 101 (H) 09/02/2017 0212    Physical Exam:    VS:  BP (!) 152/80 (BP Location: Right Arm, Patient Position: Sitting, Cuff Size: Normal)   Pulse 91   Ht 5' 4.5" (1.638 m)   Wt 131 lb (59.4 kg)   SpO2 (!) 67%   BMI 22.14 kg/m     Wt Readings from Last 3 Encounters:  11/22/17 131 lb (59.4 kg)  11/20/17 140 lb (63.5 kg)  09/28/17 138 lb 12.8  oz (63 kg)     GEN: looks chronically il right sided weakness Well nourished, well developed in no acute distress HEENT: Normal NECK: No JVD; No carotid bruits LYMPHATICS: No lymphadenopathy CARDIAC: RRR, no murmurs, rubs, gallops RESPIRATORY:  Clear to auscultation without rales, wheezing or rhonchi  ABDOMEN: Soft, non-tender, non-distended MUSCULOSKELETAL:  No edema; No deformity  SKIN: Warm and dry NEUROLOGIC:  Alert and oriented x 3 PSYCHIATRIC:  Normal affect     Signed, Shirlee More, MD  11/22/2017 4:54 PM    [facility]

## 2017-11-22 NOTE — Patient Instructions (Signed)
Medication Instructions:  Your physician has recommended you make the following change in your medication:  START aspirin enteric coated 81 mg daily  Labwork: None  Testing/Procedures: None  Follow-Up: Your physician recommends that you schedule a follow-up appointment as needed pending Dr. Denman George evaluation.  You will receive a phone call to schedule with Dr. Denman George at [facility].  Any Other Special Instructions Will Be Listed Below (If Applicable).     If you need a refill on your cardiac medications before your next appointment, please call your pharmacy.

## 2017-11-23 ENCOUNTER — Encounter: Payer: Self-pay | Admitting: Neurology

## 2017-11-23 ENCOUNTER — Ambulatory Visit: Payer: Medicare Other | Admitting: Neurology

## 2017-11-23 VITALS — BP 136/91 | HR 83 | Ht 64.0 in | Wt 139.0 lb

## 2017-11-23 DIAGNOSIS — I63419 Cerebral infarction due to embolism of unspecified middle cerebral artery: Secondary | ICD-10-CM | POA: Diagnosis not present

## 2017-11-23 DIAGNOSIS — I480 Paroxysmal atrial fibrillation: Secondary | ICD-10-CM

## 2017-11-23 DIAGNOSIS — F0151 Vascular dementia with behavioral disturbance: Secondary | ICD-10-CM

## 2017-11-23 DIAGNOSIS — G40209 Localization-related (focal) (partial) symptomatic epilepsy and epileptic syndromes with complex partial seizures, not intractable, without status epilepticus: Secondary | ICD-10-CM | POA: Diagnosis not present

## 2017-11-23 DIAGNOSIS — Z8673 Personal history of transient ischemic attack (TIA), and cerebral infarction without residual deficits: Secondary | ICD-10-CM

## 2017-11-23 DIAGNOSIS — F01518 Vascular dementia, unspecified severity, with other behavioral disturbance: Secondary | ICD-10-CM

## 2017-11-23 MED ORDER — LAMOTRIGINE 25 MG PO TABS: ORAL_TABLET | ORAL | 3 refills | Status: DC

## 2017-11-23 NOTE — Progress Notes (Signed)
STROKE NEUROLOGY FOLLOW UP NOTE  NAME: Caroline Blake DOB: 19-Mar-1946  REASON FOR VISIT: stroke follow up HISTORY FROM: husband and chart  Today we had the pleasure of seeing Caroline Blake in follow-up at our Neurology Clinic. Pt was accompanied by husband.   History Summary 72 year old female who has a very complicated past medical history. She has a history of CAD s/pstent, COPD, smoker, hypertension, kidney cancer s/p unilateral nephrectomy. She also has history of a stroke with right temporal and left subcortical infarcts. Her loop recorder inserted and found to have A. fib in 09/2016. Put on Xarelto. However, patient started to have GI bleeding with anemia requiring for blood transfusion. 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 No more GI bleeding since summer 2018. However, Xarelto was discontinued, put on Plavix. She also has a seizure in 04/2013 and was on Lamictal 100 mg twice daily since. She also has vascular and Alzheimer dementia has been following with Dr. Ernest Haber in 05/28/17 and 07/28/17 and was put on Aricept.   She had a fall in 08/2017 with fracture of right clavicle and right foot, treated with right shoulder sling and right leg splint. On 09/01/17 she was admitted for slurred speech, gibberish speech. CT no bleeding, but old right MCA and left thalamic infarcts. tPA was given. CTA head and neck showed left M1 distal chronic occlusion, left M2 collateral flow, right P2 stenosis. Repeat CT showed acute left cerebellum infarcts. EF 55-60%. LDL 101 and A1c 6.5. MRI showed multifocal infarcts involving left cerebellum, left MCA, right punctate MCA, consistent with cardioemoblic infarct due to afib not on AC.Resumed plavix, pravastatin and other home meds. Had extensive discussion with family regarding long-term anticoagulation, however family very concerned about recurrent GI bleeding and would  like to continue Plavix and to follow-up with her cardiologist for further decision.  PT/OT recommend SNF.  On 09/14/17 while at the rehab, patient developed acute onset lethargy, staring off, "rule out", and looked like a zombie as per husband.  She was admitted to [facility], found to have a AKI, UTI and dehydration, MRI negative for acute stroke.  She was treated and discharged back to SNF.   She will follow with neurology Dr. Amalia Hailey on 10/10/17, continued on Lamictal 100 mg twice daily and Aricept 15 mg daily.  On 11/20/17 she had a fall at home, not able to get up, not following any commands, staring into spaces, eyes wide open, lasting about 15 minutes.  Patient was sent to [facility] ER, MRI showed possible left MCA/ACA punctate infarct.  Continued on Plavix and recommended EEG as well as seizure as outpatient.  She saw her cardiologist Dr. Bettina Gavia 11/22/17, considered not candidate for DOAC due to severe GI bleeding in the past.  Also not a candidate for watchman device due to nickel allergy.  Decided to follow Dr. Denman George at [facility] for LARIAT device.  She had appointment with Dr. Denman George on  12/08/17.  Interval History During the interval time, the patient has been doing better.  Also right shoulder sling and right leg splint.  No significant weakness, however still disorientated and with slurry speech.  BP today 136/91.  REVIEW OF SYSTEMS: Full 14 system review of systems performed and notable only for those listed below and in HPI above, all others are negative:  Constitutional: Weight loss Cardiovascular:  Ear/Nose/Throat:   Skin:  Eyes:   Respiratory: Snoring Gastroitestinal: Incontinence  Genitourinary: Incontinence Hematology/Lymphatic: Easy bruising Endocrine:  Musculoskeletal:   Allergy/Immunology:   Neurological: Memory loss, confusion, headache, weakness, slurred speech, seizure, tremor Psychiatric: Depression Sleep: Snoring  The following represents the patient's updated allergies and  side effects list: Allergies  Allergen Reactions  . Atorvastatin Itching and Other (See Comments)    Myopathy also   . Carvedilol Itching  . Cinoxacin Itching and Other (See Comments)    Hypotension also  . Etodolac Other (See Comments), Itching and Hives  . Metoclopramide Itching  . Moxifloxacin Itching  . Yellow Dye Hives  . Paroxetine Hcl Other (See Comments)    Unknown reaction  . Rosuvastatin Calcium Other (See Comments)    Myalgias  . Nitrofurantoin Other (See Comments)    Yeast infection  . Rivaroxaban Other (See Comments)    GI bleed  . Sulfa Antibiotics Hives and Itching  . Yellow Dyes (Non-Tartrazine) Other (See Comments)    unspecified  . Aspirin-Dipyridamole Er Itching  . Ezetimibe-Simvastatin Itching  . Pitavastatin Itching    The neurologically relevant items on the patient's problem list were reviewed on today's visit.  Neurologic Examination  A problem focused neurological exam (12 or more points of the single system neurologic examination, vital signs counts as 1 point, cranial nerves count for 8 points) was performed.  Blood pressure (!) 136/91, pulse 83, height 5\' 4"  (1.626 m), weight 139 lb (63 kg).  General - Well nourished, well developed, in no apparent distress.  Ophthalmologic - Fundi not visualized due to noncooperation.  Cardiovascular - irregularly irregular heart rate and rhythm.  Mental Status -  Level of arousal and orientation to year, place, and person were intact, not orientated to month and age. Language including expression, naming, comprehension was assessed and found intact, however, difficult repeating complicated sentences.  Mild slurry speech Attention span and concentration were impaired.  Cranial Nerves II - XII - II - Visual field right upper quadrantanopsia. III, IV, VI - Extraocular movements intact, right side eyelid larger than the left. V - Facial sensation intact bilaterally. VII - Facial movement intact  bilaterally. VIII - Hearing & vestibular intact bilaterally. X - Palate elevates symmetrically. XI - Chin turning & shoulder shrug intact bilaterally. XII - Tongue protrusion intact.  Motor Strength - The patient's strength was 4/5 in all extremities and pronator drift was absent.  Bulk was normal and fasciculations were absent.   Motor Tone - Muscle tone was assessed at the neck and appendages and was normal.  Reflexes - The patient's reflexes were 1+ in all extremities and she had no pathological reflexes.  Sensory - Light touch, temperature/pinprick were assessed and were normal.    Coordination - The patient had normal movements in the hands with no ataxia or dysmetria.  Tremor was absent.  Gait and Station - slow with small stride.    Functional score  mRS = 3   0 - No symptoms.   1 - No significant disability. Able to carry out all usual activities, despite some symptoms.   2 - Slight disability. Able to look after own affairs without assistance, but unable to carry out all previous activities.   3 - Moderate disability. Requires some help, but able to walk unassisted.   4 - Moderately severe disability. Unable to attend to own bodily needs without assistance, and unable to walk unassisted.   5 - Severe disability. Requires constant nursing care and attention, bedridden, incontinent.   6 - Dead.   NIH Stroke Scale  Level Of Consciousness 0=Alert; keenly responsive 1=Not alert, but arousable by minor stimulation 2=Not alert, requires repeated stimulation 3=Responds only with reflex movements 0  LOC Questions to Month and Age 68=Answers both questions correctly 1=Answers one question correctly 2=Answers neither question correctly 1  LOC Commands      -Open/Close eyes     -Open/close grip 0=Performs both tasks correctly 1=Performs one task correctly 2=Performs neighter task correctly 0  Best Gaze 0=Normal 1=Partial gaze palsy 2=Forced deviation, or total gaze  paresis 0  Visual 0=No visual loss 1=Partial hemianopia 2=Complete hemianopia 3=Bilateral hemianopia (blind including cortical blindness) 1  Facial Palsy 0=Normal symmetrical movement 1=Minor paralysis (asymmetry) 2=Partial paralysis (lower face) 3=Complete paralysis (upper and lower face) 0  Motor  0=No drift, limb holds posture for full 10 seconds 1=Drift, limb holds posture, no drift to bed 2=Some antigravity effort, cannot maintain posture, drifts to bed 3=No effort against gravity, limb falls 4=No movement Right Arm 0     Leg 0    Left Arm 1     Leg 1  Limb Ataxia 0=Absent 1=Present in one limb 2=Present in two limbs 0  Sensory 0=Normal 1=Mild to moderate sensory loss 2=Severe to total sensory loss 0  Best Language 0=No aphasia, normal 1=Mild to moderate aphasia 2=Mute, global aphasia 3=Mute, global aphasia 0  Dysarthria 0=Normal 1=Mild to moderate 2=Severe, unintelligible or mute/anarthric 1  Extinction/Neglect 0=No abnormality 1=Extinction to bilateral simultaneous stimulation 2=Profound neglect 0  Total   5     Data reviewed: I personally reviewed the images and agree with the radiology interpretations.  CT Head Code Stroke Ct Angio Head and Neck  CT Perfusion W Or Wo Contrast 09/01/2017 IMPRESSION:  1. Distal left M1 short segment occlusion with opacified but under filled left M2 branches.This occlusion is age-indeterminate. No infarct by cerebral perfusion but there is 18cc penumbra/ischemia. 2. No flow limiting stenosis or ulceration in the neck. The patient is wearing animplanted loop recorder. 3. Multifocal remote MCA distribution infarcts.  4. Age-indeterminate left thalamus lacunar infarct.  5. Moderate to advanced right distal P2 segment stenosis.  6. Aortic Atherosclerosis (ICD10-I70.0) and Emphysema (ICD10-J43.9).  7. Unhealed right mid clavicle fracture with surrounding hemorrhage, favored subacute. 8. 2 cm nodule at the thoracic inletthat  is high-density or enhancing. Question polar vessel. There is changes of thyroidectomy. Favorresidual thyroid tissue or parathyroid adenomaover adenopathy.   Ct Head Wo Contrast 09/01/2017 IMPRESSION:  No acute hemorrhage following tPA.No change from head CT earlier today. Chronic ischemic changes bilaterally.   MRI HeadWo Contrast 09/04/2017 IMPRESSION: Acute infarctions in the anterior and posterior circulation suggesting embolic disease from the heart or ascending aorta.  Acute infarctions are most prominently in the left superior cerebellum and left insula and parietal region with other punctate acute infarctions scattered within the left insula and parietal regions and within the right cerebellum and left posteromedial temporal lobe.  Extensive chronic ischemic changes elsewhere throughout the brain including an old infarction in the right insula and temporoparietal region.  CT Head Wo Contrast 09/02/2017 IMPRESSION: 1. Acute to early subacute infarct of the left cerebellum,within the territory of the superior cerebellar artery. 2. Otherwise unchanged appearance of multiple old infarcts. 3. No acute hemorrhage, herniation or hydrocephalus. Echocardiogram 09/02/2017  Study Conclusions - Left ventricle: The cavity size was normal. Systolic function was normal. The estimated ejection fraction was in the range of 55% to 60%.Wall motion was normal; there were no regional wall motion abnormalities. Doppler parameters are consistent with abnormal left  ventricular relaxation (grade 1 diastolic dysfunction).   Component     Latest Ref Rng & Units 09/02/2017  Cholesterol     0 - 200 mg/dL 156  Triglycerides     <150 mg/dL 41  HDL Cholesterol     >40 mg/dL 47  Total CHOL/HDL Ratio     RATIO 3.3  VLDL     0 - 40 mg/dL 8  LDL (calc)     0 - 99 mg/dL 101 (H)  Hemoglobin A1C     4.8 - 5.6 % 6.5 (H)    Mean Plasma Glucose     mg/dL 139.85    Assessment: As you may recall, she is a 72 y.o. Caucasian female with PMH of CAD s/pstent, COPD, smoker, hypertension, kidney cancer s/p unilateral nephrectomy. She also has history of a stroke with right temporal and left subcortical infarcts. Her loop recorder inserted and found to have A. fib in 09/2016. Put on Xarelto. However, patient started to have GIB requiring for blood transfusion. 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 No more GI bleeding since summer 2018. However, Xarelto was discontinued, put on Plavix. She also has a seizure in 04/2013 and was on Lamictal 100 mg twice daily since. She also has vascular and Alzheimer dementia has been following with Dr. Ernest Haber in 05/28/17 and 07/28/17 and was put on Aricept.   On 09/01/17 she was admitted for  multifocal infarcts involving left cerebellum, left MCA, right punctate MCA, consistent with cardioemoblic infarct due to afib not on AC. CTA head and neck showed left M1 distal chronic occlusion, left M2 collateral flow, right P2 stenosis. EF 55-60%. LDL 101 and A1c 6.5. Resumed plavix, pravastatin. Family very concerned about recurrent GIB and would like to continue Plavix and to follow-up with her cardiologist for further decision.  PT/OT recommend SNF.  On 09/14/17 admitted to [facility] for AKI, UTI and dehydration, MRI negative for acute stroke. Follow with neurology Dr. Amalia Hailey on 10/10/17, continued on Lamictal 100 mg twice daily and Aricept 15 mg daily.  On 11/20/17 she was sent to [facility] ER for seizure like activity, MRI showed possible left MCA/ACA punctate infarct.  Continued on Plavix and recommended EEG as well as seizure as outpatient. She saw her cardiologist Dr. Bettina Gavia 11/22/17, considered not candidate for DOAC due to severe GI bleeding in the past.  Also not a candidate for watchman device due to nickel allergy.  Decided to  follow Dr. Denman George at [facility] for LARIAT device.  She had appointment with Dr. Denman George on  12/08/17.  Given hx of seizure, with recent seizure like activity x 2, will titrate lamictal to 150mg  bid. Will do EEG. Follow up with Dr. Denman George for LARIAT device.  Plan:  - continue plavix and pravastatin for stroke prevention - follow up with [facility] cardiology for the Lariat device  - will do EEG for seizure evaluation - will increase lamictal to 125mg  twice a day for 2 weeks and then lamictal 150mg  bid for seizure control - continue follow up with Dr. Lemar Livings - continue follow up with Dr. Amalia Hailey and continue aricept - Follow up with your primary care physician for stroke risk factor modification. Recommend maintain blood pressure goal <130/80, diabetes with hemoglobin A1c goal below 7.0% and lipids with LDL cholesterol goal below 70 mg/dL.  - follow up in 4 weeks with me.   A total of 45 minutes was spent face-to-face  with this patient. Over half this time was spent on counseling patient on the multiple diagnosis including seizure, stroke, dementia and different diagnostic and therapeutic options available.    Orders Placed This Encounter  Procedures  . EEG adult    Standing Status:   Future    Standing Expiration Date:   11/24/2018    Meds ordered this encounter  Medications  . lamoTRIgine (LAMICTAL) 25 MG tablet    Sig: Take 25mg  bid for 2 weeks, and then 50mg  bid after. Make the total lamictal 150mg  bid at the end.    Dispense:  240 tablet    Refill:  3    Continue regular lamictal 100mg  bid and the total lamictal 150mg  at the end.    Patient Instructions  - continue plavix and pravastatin for stroke prevention - follow up with [facility] cardiology for the Lariat device  - will do EEG for seizure evaluation - will increase lamictal to 125mg  twice a day for 2 weeks and then lamictal 150mg  bid for seizure control - continue follow up with Dr. Lemar Livings - continue follow up with Dr. Amalia Hailey and  continue aricept - Follow up with your primary care physician for stroke risk factor modification. Recommend maintain blood pressure goal <130/80, diabetes with hemoglobin A1c goal below 7.0% and lipids with LDL cholesterol goal below 70 mg/dL.  - follow up in 4 weeks with me.       Rosalin Hawking, MD PhD [facility] [address], [place], [place] [address] [phone]

## 2017-11-23 NOTE — Patient Instructions (Signed)
-   continue plavix and pravastatin for stroke prevention - follow up with [facility] cardiology for the Lariat device  - will do EEG for seizure evaluation - will increase lamictal to 125mg  twice a day for 2 weeks and then lamictal 150mg  bid for seizure control - continue follow up with Dr. Lemar Livings - continue follow up with Dr. Amalia Hailey and continue aricept - Follow up with your primary care physician for stroke risk factor modification. Recommend maintain blood pressure goal <130/80, diabetes with hemoglobin A1c goal below 7.0% and lipids with LDL cholesterol goal below 70 mg/dL.  - follow up in 4 weeks with me.

## 2017-11-28 NOTE — Patient Outreach (Signed)
mRs completed by Dr. Erlinda Hong on 11/23/17 during a offive visit.  mRs = 3

## 2017-12-21 ENCOUNTER — Ambulatory Visit: Payer: Medicare Other | Admitting: Neurology

## 2017-12-21 ENCOUNTER — Telehealth: Payer: Self-pay | Admitting: Neurology

## 2017-12-21 NOTE — Telephone Encounter (Signed)
Pts husband called stating they are currenly at [facility] in [facility] and are unable to make appt time, due to Dr. Erlinda Hong leaving our office I schedule pt with NP Janett Billow. Duffy Rhody that the NP may feel his wife should see the MD and if that is the case we will call to r/s.

## 2017-12-21 NOTE — Telephone Encounter (Signed)
Sounds good with me. Thanks.   Rosalin Hawking, MD PhD Stroke Neurology 12/21/2017 11:26 AM

## 2017-12-21 NOTE — Telephone Encounter (Signed)
This should be fine. Thank you for [facility]

## 2017-12-22 ENCOUNTER — Other Ambulatory Visit: Payer: Medicare Other

## 2017-12-28 ENCOUNTER — Ambulatory Visit: Payer: Medicare Other | Admitting: Adult Health

## 2018-01-09 ENCOUNTER — Ambulatory Visit: Payer: Medicare Other | Admitting: Adult Health

## 2018-01-10 ENCOUNTER — Encounter: Payer: Self-pay | Admitting: Adult Health

## 2018-01-19 ENCOUNTER — Other Ambulatory Visit: Payer: Medicare Other

## 2018-03-23 DIAGNOSIS — I4892 Unspecified atrial flutter: Secondary | ICD-10-CM | POA: Insufficient documentation

## 2018-03-23 DIAGNOSIS — Z79899 Other long term (current) drug therapy: Secondary | ICD-10-CM | POA: Insufficient documentation

## 2018-03-23 NOTE — Progress Notes (Signed)
Cardiology Office Note:    Date:  03/24/2018   ID:  Caroline Blake, DOB 1946-03-27, MRN [id number]  PCP:  Caroline Leach, MD  Cardiologist:  Caroline More, MD    Referring MD: Caroline Leach, MD    ASSESSMENT:    1. Paroxysmal atrial fibrillation (HCC)   2. Atrial flutter, unspecified type ([place])   3. Hypertensive heart disease, unspecified whether heart failure present   4. CAD in native artery   5. On amiodarone therapy   6. Hyperlipidemia, unspecified hyperlipidemia type    PLAN:    In order of problems listed above:  1. Improved with amiodarone maintaining sinus rhythm at 3 weeks she will reduce to 200 mg daily check baseline thyroid CMP and amiodarone level today.  I discussed with the patient and husband that if she fails amiodarone AV nodal ablation and pacemaker would be appropriate as clearly rapid atrial fibrillation caused her deterioration.  When she finished at [facility] after lariat procedure she was taken off the dual antiplatelet therapy she has put back on in the hospital recently and I will tell the patient to discontinue. 2. Improved maintaining sinus rhythm 3. Stable blood pressure target no evidence of heart failure 4. Stable continue medical therapy I would not advise an ischemia evaluation at this time 5. Continue amiodarone screen for toxicity check and liver function thyroid test 6. Stable continue statin check lipid profile   Next appointment: Months   Medication Adjustments/Labs and Tests Ordered: Current medicines are reviewed at length with the patient today.  Concerns regarding medicines are outlined above.  No orders of the defined types were placed in this encounter.  Meds ordered this encounter  Medications  . amiodarone (PACERONE) 200 MG tablet    Sig: Take 1 tablet (200 mg total) by mouth 2 (two) times daily.    Dispense:  180 tablet    Refill:  3    Chief Complaint  Patient presents with  . Atrial Fibrillation    now on  amiodarone  . Chronic Kidney Disease  . Hypertension    History of Present Illness:    Caroline Blake is a 72 y.o. female with a hx of CAD status post stent, COPD, smoker, hypertension, kidney cancer status post unilateral nephrectomy.  She also has history of a stroke with right temporal and left subcortical infarcts.  Her loop recorder inserted and found to have A. fib in 09/2016.  Put on Xarelto.  However, patient started to have GI bleeding with anemia requiring for blood transfusion.  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 last seen 11/22/17. She underwent Lariat procedure for LAA oclusion at [place] June 2019.  She was seen by him 03/02/18 and he stopped her DAPT.  She had recent admission [facility] 03/16/2018 03/19/2018 her problems include respiratory failurefrom COPD  with rapid atrial fibrillation acute renal failure superimposed on chronic kidney disease.  On admission she was found to be in atrial flutter was unable to tolerate calcium channel blocker with hypotension and was put on amiodarone.  Echocardiogram 03/02/2018 showed EF greater than 55% mild mitral regurgitation and atrial septal defect that was small with evidence of left to right shunting  Compliance with diet, lifestyle and medications: 11/22/17  She is improved no recurrent episodes of weakness shortness of breath chest pain palpitation or syncope.  Caroline Blake was 9 in the hospital they put her back on dual  antiplatelet therapy we will stop it and recheck a CBC today.  To the visit I reviewed records from [facility] with Caroline Blake at [place] recent admission to the hospital Past Medical History:  Diagnosis Date  . Anemia   . Arthritis of ankle or foot, degenerative 04/30/2016  . Atrial fibrillation ([place]) 09/2016   with left atrial appendage, failed xarelto due to GIB, unable to have watchman due to nickel  allergy and Coumadin never started  . AVM (arteriovenous malformation) of colon with hemorrhage 2018   requiring transfusion --- last one was March 2018 - HgB on 10/22/2016 was 7.3 and on 10/23/2016 was 8.7  . Bacteriuria with pyuria 08/14/2015  . Benign hypertension 1995  . Cancer of kidney ([place])   . Cholelithiasis 2008  . Chronic conjunctivitis 07/04/2012  . Chronic kidney disease   . Clostridium difficile colitis 2017  . Cognitive impairment   . Colon polyp 2005   and 11/16/2016  . COPD (chronic obstructive pulmonary disease) ([place])    and currenlty smokes--- refuses to use HS home O2  . Coronary artery disease 2002  . CVA (cerebral vascular accident) ([place])   . Cystitis    INTESTINAL CHRONIC  . DDD (degenerative disc disease), cervical   . Depression   . Diabetes mellitus ([place]) 1999   HGBA1C on 09/09/2016 was 6.8  . Disease of thyroid gland   . History of kidney cancer    LEFT  . History of pancreatitis   . History of transfusion 10/2016  . Hyperlipidemia   . Hypertension   . Hypothyroidism   . Iron deficiency anemia 09/27/2016  . Left homonymous hemianopsia 07/04/2012  . Lisfranc dislocation, right, initial encounter 08/14/2017  . Memory impairment 09/09/2016  . Nuclear sclerosis of left eye 12/09/2016   added automatically from request for surgery [phone]  . OSA (obstructive sleep apnea)    o2 3L  PRN  . Pancreatitis 2008  . Posterior vitreous detachment 07/04/2012  . Postsurgical hypothyroidism    S/P partial thyroidectomy 1978, later went for additional parital thyroidectomy  . Renal cell adenocarcinoma, left ([place]) 2005   [place]  . Retinal drusen 07/04/2012  . Shortness of breath   . Sinus tarsitis of left foot 09/15/2016  . Snores 2005   does not use CPAP regularly  . Stroke [facility]) 2011   LLE sensory, 2012 x2 and 2013 x1; [place]  . TIA (transient ischemic attack) 10/21/2016  . Type 2 diabetes mellitus ([place])   . Type 2 diabetes mellitus with stage 3 chronic kidney  disease, with long-term current use of insulin ([place])    on 10/23/2016 Scr was 1.89, GFR 26 and K+ 4.2    Past Surgical History:  Procedure Laterality Date  . CATARACT EXTRACTION    . CATARACT EXTRACTION W/ INTRAOCULAR LENS IMPLANT Left 01/19/2017   Procedure: PHACOEMULSIFICATION PC / IOL TOPICAL; Surgeon: Sonia Side Burden, MD; Location: [place]; Service: Ophthalmology; Laterality: Left;  . CHOLECYSTECTOMY    . COLONOSCOPY Left 09/22/2016   Procedure: COLONOSCOPY, FLEXIBLE, PROXIMAL TO SPLENIC FLEXURE; DIAGNOSTIC, W/WO COLLECTION SPECIMEN BY BRUSH OR [place]; Surgeon: Ned Card, MD; Location: Endo Procedures [facility]; Service: Gastroenterology  . COLONOSCOPY  11/16/2016   Procedure: COLONOSCOPY, FLEXIBLE, PROXIMAL TO SPLENIC FLEXURE; DIAGNOSTIC, W/WO COLLECTION SPECIMEN BY BRUSH OR [place]; Surgeon: Ned Card, MD; Location: Endo Procedures [facility]; Service: Gastroenterology  . COLONOSCOPY W/ BIOPSIES  10/07/2016   Procedure: COLONOSCOPY, FLEXIBLE, PROXIMAL TO SPLENIC FLEXURE; WITH BIOPSY,  SINGLE OR MULTIPLE; Surgeon: Bing Quarry, MD; Location: Endo Procedures [facility]; Service: Gastroenterology  . CORONARY STENT PLACEMENT  2002  . GALLBLADDER SURGERY  2008  . LOOP RECORDER IMPLANT  2018   medtronic reveal linq  . NEPHRECTOMY Left 2005   renal cancer   . THYROIDECTOMY, PARTIAL  1978   x2  . TOTAL ABDOMINAL HYSTERECTOMY W/ BILATERAL SALPINGOOPHORECTOMY  1984  . TUBAL LIGATION  1980  . UPPER GASTROINTESTINAL ENDOSCOPY Left 09/22/2016   Procedure: UGI ENDO, INCLUDE ESOPHAGUS, STOMACH, & DUODENUM &/OR JEJUNUM; DX W/WO COLLECTION SPECIMN, BY BRUSH OR [place]; Surgeon: Ned Card, MD; Location: Endo Procedures [place]; Service: Gastroenterology    Current Medications: Current Meds  Medication Sig  . amiodarone (PACERONE) 200 MG tablet Take 1 tablet (200 mg total) by mouth 2 (two) times daily.  Marland Kitchen amLODipine (NORVASC) 5 MG tablet Take 5 mg by mouth daily.  Marland Kitchen donepezil  (ARICEPT) 10 MG tablet Take 1 mg by mouth daily.   Marland Kitchen ezetimibe (ZETIA) 10 MG tablet Take 10 mg by mouth daily.  . furosemide (LASIX) 20 MG tablet Take 1 tablet by mouth daily.  . insulin aspart protamine- aspart (NOVOLOG MIX 70/30) (70-30) 100 UNIT/ML injection Inject 3 Units into the skin 2 (two) times daily.  Marland Kitchen lamoTRIgine (LAMICTAL) 100 MG tablet Take 1 tablet (100 mg total) by mouth 2 (two) times daily.  Marland Kitchen levothyroxine (SYNTHROID, LEVOTHROID) 150 MCG tablet Take 150 mcg by mouth daily.  . metroNIDAZOLE (METROGEL) 0.75 % vaginal gel Place 1 Applicatorful vaginally daily as needed for itching.  . mirabegron ER (MYRBETRIQ) 50 MG TB24 tablet Take 25 mg by mouth at bedtime.  . ondansetron (ZOFRAN) 4 MG tablet Take 1 tablet by mouth as needed.  . pravastatin (PRAVACHOL) 80 MG tablet Take 80 mg by mouth daily.  . sertraline (ZOLOFT) 100 MG tablet Take 200 mg by mouth daily.  . traMADol (ULTRAM) 50 MG tablet Take 50 mg by mouth every 6 (six) hours as needed for moderate pain.   . [DISCONTINUED] amiodarone (PACERONE) 200 MG tablet Take 1 tablet by mouth 2 (two) times daily.     Allergies:   Atorvastatin; Carvedilol; Cinoxacin; Etodolac; Metoclopramide; Moxifloxacin; Yellow dye; Paroxetine hcl; Rosuvastatin calcium; Nitrofurantoin; Rivaroxaban; Sulfa antibiotics; Yellow dyes (non-tartrazine); Aspirin-dipyridamole er; Ezetimibe-simvastatin; and Pitavastatin   Social History   Socioeconomic History  . Marital status: Married    Spouse name: Not on file  . Number of children: Not on file  . Years of education: Not on file  . Highest education level: Not on file  Occupational History  . Not on file  Social Needs  . Financial resource strain: Not on file  . Food insecurity:    Worry: Not on file    Inability: Not on file  . Transportation needs:    Medical: Not on file    Non-medical: Not on file  Tobacco Use  . Smoking status: Former Smoker    Packs/day: 1.00    Years: 50.00    Pack  years: 50.00    Types: Cigarettes    Last attempt to quit: 09/01/2017    Years since quitting: 0.5  . Smokeless tobacco: Never Used  Substance and Sexual Activity  . Alcohol use: No    Frequency: Never  . Drug use: Not Currently  . Sexual activity: Never  Lifestyle  . Physical activity:    Days per week: Not on file    Minutes per session: Not on file  . Stress: Not  on file  Relationships  . Social connections:    Talks on phone: Not on file    Gets together: Not on file    Attends religious service: Not on file    Active member of club or organization: Not on file    Attends meetings of clubs or organizations: Not on file    Relationship status: Not on file  Other Topics Concern  . Not on file  Social History Narrative  . Not on file     Family History: The patient's family history includes Aneurysm in her brother and mother; Anuerysm in her father; Cancer in her daughter; Heart disease in her father; Hyperlipidemia in her father and mother; Hypertension in her sister; Lung cancer in her mother; Ovarian cancer in her daughter; Stroke in her father; Tremor in her father. There is no history of Anesthesia problems, Arthritis, Asthma, Cerebral palsy, Clotting disorder, Deep vein thrombosis, Club foot, Collagen disease, Diabetes, Gait disorder, Gout, Hip dysplasia, Hip fracture, Hypermobility, Osteoporosis, Pulmonary embolism, Scoliosis, Spina bifida, Thyroid disease, Vasculitis, or Rheumatologic disease. ROS:   Please see the history of present illness.    All other systems reviewed and are negative.  EKGs/Labs/Other Studies Reviewed:    The following studies were reviewed today:  EKG:  EKG ordered today.  The ekg ordered today demonstrates [place] RAE LAE low voltage  Recent Labs:   Hgb 9.0  CR 2.87 K 4.8 09/19/2017: Magnesium 2.5 11/20/2017: ALT 14; BUN 58; Creatinine, Ser 2.70; Hemoglobin 11.6; Platelets 311; Potassium 5.3; Sodium 138  Recent Lipid Panel    Component Value  Date/Time   CHOL 156 09/02/2017 0212   TRIG 41 09/02/2017 0212   HDL 47 09/02/2017 0212   CHOLHDL 3.3 09/02/2017 0212   VLDL 8 09/02/2017 0212   LDLCALC 101 (H) 09/02/2017 0212    Physical Exam:    VS:  BP (!) 103/58 (BP Location: Right Arm, Patient Position: Sitting, Cuff Size: Normal)   Pulse 95   Ht 5\' 4"  (1.626 m)   Wt 139 lb (63 kg)   SpO2 93%   BMI 23.86 kg/m     Wt Readings from Last 3 Encounters:  03/24/18 139 lb (63 kg)  11/23/17 139 lb (63 kg)  11/22/17 131 lb (59.4 kg)     GEN:  Well nourished, well developed in no acute distress HEENT: Normal NECK: No JVD; No carotid bruits LYMPHATICS: No lymphadenopathy CARDIAC: RRR, no murmurs, rubs, gallops RESPIRATORY:  Clear to auscultation without rales, wheezing or rhonchi  ABDOMEN: Soft, non-tender, non-distended MUSCULOSKELETAL:  No edema; No deformity  SKIN: Warm and dry NEUROLOGIC:  Alert and oriented x 3 PSYCHIATRIC:  Normal affect    Signed, Caroline More, MD  03/24/2018 9:21 AM    [place]

## 2018-03-24 ENCOUNTER — Ambulatory Visit: Payer: Medicare Other | Admitting: Cardiology

## 2018-03-24 ENCOUNTER — Encounter: Payer: Self-pay | Admitting: Cardiology

## 2018-03-24 VITALS — BP 103/58 | HR 95 | Ht 64.0 in | Wt 139.0 lb

## 2018-03-24 DIAGNOSIS — I2510 Atherosclerotic heart disease of native coronary artery without angina pectoris: Secondary | ICD-10-CM | POA: Diagnosis not present

## 2018-03-24 DIAGNOSIS — I480 Paroxysmal atrial fibrillation: Secondary | ICD-10-CM

## 2018-03-24 DIAGNOSIS — I119 Hypertensive heart disease without heart failure: Secondary | ICD-10-CM | POA: Diagnosis not present

## 2018-03-24 DIAGNOSIS — E785 Hyperlipidemia, unspecified: Secondary | ICD-10-CM

## 2018-03-24 DIAGNOSIS — Z79899 Other long term (current) drug therapy: Secondary | ICD-10-CM

## 2018-03-24 DIAGNOSIS — I4892 Unspecified atrial flutter: Secondary | ICD-10-CM | POA: Diagnosis not present

## 2018-03-24 MED ORDER — AMIODARONE HCL 200 MG PO TABS: 200.0000 mg | ORAL_TABLET | Freq: Two times a day (BID) | ORAL | 3 refills | Status: DC

## 2018-03-24 NOTE — Patient Instructions (Signed)
Medication Instructions:  Your physician recommends that you continue on your current medications as directed. Please refer to the Current Medication list given to you today.   Labwork: Your physician recommends that you return for lab work today: CBC, CMP, amiodarone level, TSH.   Testing/Procedures: You had an EKG today.   Follow-Up: Your physician wants you to follow-up in: 3 months. You will receive a reminder letter in the mail two months in advance. If you don't receive a letter, please call our office to schedule the follow-up appointment.   If you need a refill on your cardiac medications before your next appointment, please call your pharmacy.   Thank you for choosing [facility]! Robyne Peers, RN [phone]

## 2018-03-27 ENCOUNTER — Telehealth: Payer: Self-pay

## 2018-03-27 DIAGNOSIS — I119 Hypertensive heart disease without heart failure: Secondary | ICD-10-CM

## 2018-03-27 NOTE — Telephone Encounter (Signed)
-----   Message from Richardo Priest, MD sent at 03/25/2018  9:38 AM EDT ----- Kidney function is worse   Stop furosemide and recheck bmp 1 week

## 2018-03-27 NOTE — Telephone Encounter (Signed)
Patients husband Shanon Brow notified of lab results with worsening kidney functions per Dr Bettina Gavia.  Shanon Brow advised to stop furosemide and recheck BMP in one week.   Patients husband verbalized understanding and agreed to plan.

## 2018-03-27 NOTE — Addendum Note (Signed)
Addended by: Stevan Born on: 03/27/2018 11:49 AM   Modules accepted: Orders

## 2018-03-28 LAB — CBC
Hematocrit: 32.2 % — ABNORMAL LOW (ref 34.0–46.6)
Hemoglobin: 10 g/dL — ABNORMAL LOW (ref 11.1–15.9)
MCH: 25.1 pg — AB (ref 26.6–33.0)
MCHC: 31.1 g/dL — AB (ref 31.5–35.7)
MCV: 81 fL (ref 79–97)
PLATELETS: 489 10*3/uL — AB (ref 150–450)
RBC: 3.98 x10E6/uL (ref 3.77–5.28)
RDW: 16.2 % — ABNORMAL HIGH (ref 12.3–15.4)
WBC: 8.6 10*3/uL (ref 3.4–10.8)

## 2018-03-28 LAB — COMPREHENSIVE METABOLIC PANEL
A/G RATIO: 1.4 (ref 1.2–2.2)
ALK PHOS: 62 IU/L (ref 39–117)
ALT: 6 IU/L (ref 0–32)
AST: 10 IU/L (ref 0–40)
Albumin: 3.9 g/dL (ref 3.5–4.8)
BUN/Creatinine Ratio: 17 (ref 12–28)
BUN: 55 mg/dL — ABNORMAL HIGH (ref 8–27)
Bilirubin Total: <0.2 mg/dL (ref 0.0–1.2)
CALCIUM: 8.6 mg/dL — AB (ref 8.7–10.3)
CO2: 18 mmol/L — AB (ref 20–29)
CREATININE: 3.2 mg/dL — AB (ref 0.57–1.00)
Chloride: 103 mmol/L (ref 96–106)
GFR calc Af Amer: 16 mL/min/{1.73_m2} — ABNORMAL LOW (ref >59)
GFR calc non Af Amer: 14 mL/min/{1.73_m2} — ABNORMAL LOW (ref >59)
GLOBULIN, TOTAL: 2.7 g/dL (ref 1.5–4.5)
Glucose: 100 mg/dL — ABNORMAL HIGH (ref 65–99)
Potassium: 5.1 mmol/L (ref 3.5–5.2)
SODIUM: 141 mmol/L (ref 134–144)
Total Protein: 6.6 g/dL (ref 6.0–8.5)

## 2018-03-28 LAB — AMIODARONE LEVEL
Amiodarone, Serum: 0.5 ug/mL — ABNORMAL LOW (ref 1.0–2.5)
Noramiodarone,S: 0.3 ug/mL — ABNORMAL LOW (ref 1.0–2.5)

## 2018-03-28 LAB — TSH: TSH: 1.44 u[IU]/mL (ref 0.450–4.500)

## 2018-06-23 NOTE — Progress Notes (Deleted)
Cardiology Office Note:    Date:  06/23/2018   ID:  Caroline Blake, Caroline Blake 05-24-1946, MRN [id number]  PCP:  Drake Leach, MD  Cardiologist:  Shirlee More, MD    Referring MD: Drake Leach, MD    ASSESSMENT:    No diagnosis found. PLAN:    In order of problems listed above:  1. ***   Next appointment: ***   Medication Adjustments/Labs and Tests Ordered: Current medicines are reviewed at length with the patient today.  Concerns regarding medicines are outlined above.  No orders of the defined types were placed in this encounter.  No orders of the defined types were placed in this encounter.   No chief complaint on file.   History of Present Illness:    Caroline Blake is a 72 y.o. female with a hx of CAD status post stent, COPD, smoker, hypertension, kidney cancer status post unilateral nephrectomy.  She also has history of a stroke with right temporal and left subcortical infarcts.  Her loop recorder inserted and found to have A. fib in 09/2016.  Put on Xarelto.  However, patient started to have GI bleeding with anemia requiring for blood transfusion. She had an extensive GI workup in 09/28/16 to 10/26/16 found to have angiodysplasia of colon which has been cauterized. She had an admission [facility] 03/16/2018 03/19/2018 her problems include respiratory failurefrom COPD  with rapid atrial fibrillation acute renal failure superimposed on chronic kidney disease.  On admission she was found to be in atrial flutter was unable to tolerate calcium channel blocker with hypotension and was put on amiodarone.  Echocardiogram 03/02/2018 showed EF greater than 55% mild mitral regurgitation and atrial septal defect that was small with evidence of left to right shunting  She was last seen 03/24/18. Compliance with diet, lifestyle and medications: *** Past Medical History:  Diagnosis Date  . Anemia   . Arthritis of ankle or foot, degenerative 04/30/2016  . Atrial fibrillation  ([place]) 09/2016   with left atrial appendage, failed xarelto due to GIB, unable to have watchman due to nickel allergy and Coumadin never started  . AVM (arteriovenous malformation) of colon with hemorrhage 2018   requiring transfusion --- last one was March 2018 - HgB on 10/22/2016 was 7.3 and on 10/23/2016 was 8.7  . Bacteriuria with pyuria 08/14/2015  . Benign hypertension 1995  . Cancer of kidney ([place])   . Cholelithiasis 2008  . Chronic conjunctivitis 07/04/2012  . Chronic kidney disease   . Clostridium difficile colitis 2017  . Cognitive impairment   . Colon polyp 2005   and 11/16/2016  . COPD (chronic obstructive pulmonary disease) ([place])    and currenlty smokes--- refuses to use HS home O2  . Coronary artery disease 2002  . CVA (cerebral vascular accident) ([place])   . Cystitis    INTESTINAL CHRONIC  . DDD (degenerative disc disease), cervical   . Depression   . Diabetes mellitus ([place]) 1999   HGBA1C on 09/09/2016 was 6.8  . Disease of thyroid gland   . History of kidney cancer    LEFT  . History of pancreatitis   . History of transfusion 10/2016  . Hyperlipidemia   . Hypertension   . Hypothyroidism   . Iron deficiency anemia 09/27/2016  . Left homonymous hemianopsia 07/04/2012  . Lisfranc dislocation, right, initial encounter 08/14/2017  . Memory impairment 09/09/2016  . Nuclear sclerosis of left eye 12/09/2016   added automatically from request for surgery [phone]  . OSA (  obstructive sleep apnea)    o2 3L [place] PRN  . Pancreatitis 2008  . Posterior vitreous detachment 07/04/2012  . Postsurgical hypothyroidism    S/P partial thyroidectomy 1978, later went for additional parital thyroidectomy  . Renal cell adenocarcinoma, left ([place]) 2005   [place]  . Retinal drusen 07/04/2012  . Shortness of breath   . Sinus tarsitis of left foot 09/15/2016  . Snores 2005   does not use CPAP regularly  . Stroke [facility]) 2011   LLE sensory, 2012 x2 and 2013 x1; [place]  . TIA (transient ischemic  attack) 10/21/2016  . Type 2 diabetes mellitus ([place])   . Type 2 diabetes mellitus with stage 3 chronic kidney disease, with long-term current use of insulin ([place])    on 10/23/2016 Scr was 1.89, GFR 26 and K+ 4.2    Past Surgical History:  Procedure Laterality Date  . CATARACT EXTRACTION    . CATARACT EXTRACTION W/ INTRAOCULAR LENS IMPLANT Left 01/19/2017   Procedure: PHACOEMULSIFICATION PC / IOL TOPICAL; Surgeon: Sonia Side Burden, MD; Location: [place]; Service: Ophthalmology; Laterality: Left;  . CHOLECYSTECTOMY    . COLONOSCOPY Left 09/22/2016   Procedure: COLONOSCOPY, FLEXIBLE, PROXIMAL TO SPLENIC FLEXURE; DIAGNOSTIC, W/WO COLLECTION SPECIMEN BY BRUSH OR [place]; Surgeon: Ned Card, MD; Location: Endo Procedures [facility]; Service: Gastroenterology  . COLONOSCOPY  11/16/2016   Procedure: COLONOSCOPY, FLEXIBLE, PROXIMAL TO SPLENIC FLEXURE; DIAGNOSTIC, W/WO COLLECTION SPECIMEN BY BRUSH OR [place]; Surgeon: Ned Card, MD; Location: Endo Procedures [facility]; Service: Gastroenterology  . COLONOSCOPY W/ BIOPSIES  10/07/2016   Procedure: COLONOSCOPY, FLEXIBLE, PROXIMAL TO SPLENIC FLEXURE; WITH BIOPSY, SINGLE OR MULTIPLE; Surgeon: Bing Quarry, MD; Location: Endo Procedures [facility]; Service: Gastroenterology  . CORONARY STENT PLACEMENT  2002  . GALLBLADDER SURGERY  2008  . LOOP RECORDER IMPLANT  2018   medtronic reveal linq  . NEPHRECTOMY Left 2005   renal cancer   . THYROIDECTOMY, PARTIAL  1978   x2  . TOTAL ABDOMINAL HYSTERECTOMY W/ BILATERAL SALPINGOOPHORECTOMY  1984  . TUBAL LIGATION  1980  . UPPER GASTROINTESTINAL ENDOSCOPY Left 09/22/2016   Procedure: UGI ENDO, INCLUDE ESOPHAGUS, STOMACH, & DUODENUM &/OR JEJUNUM; DX W/WO COLLECTION SPECIMN, BY BRUSH OR [place]; Surgeon: Ned Card, MD; Location: Endo Procedures [place]; Service: Gastroenterology    Current Medications: No outpatient medications have been marked as taking for the 06/26/18 encounter (Appointment)  with Richardo Priest, MD.     Allergies:   Atorvastatin; Carvedilol; Cinoxacin; Etodolac; Metoclopramide; Moxifloxacin; Yellow dye; Paroxetine hcl; Rosuvastatin calcium; Nitrofurantoin; Rivaroxaban; Sulfa antibiotics; Yellow dyes (non-tartrazine); Aspirin-dipyridamole er; Ezetimibe-simvastatin; and Pitavastatin   Social History   Socioeconomic History  . Marital status: Married    Spouse name: Not on file  . Number of children: Not on file  . Years of education: Not on file  . Highest education level: Not on file  Occupational History  . Not on file  Social Needs  . Financial resource strain: Not on file  . Food insecurity:    Worry: Not on file    Inability: Not on file  . Transportation needs:    Medical: Not on file    Non-medical: Not on file  Tobacco Use  . Smoking status: Former Smoker    Packs/day: 1.00    Years: 50.00    Pack years: 50.00    Types: Cigarettes    Last attempt to quit: 09/01/2017    Years since quitting: 0.8  . Smokeless tobacco: Never Used  Substance and Sexual Activity  .  Alcohol use: No    Frequency: Never  . Drug use: Not Currently  . Sexual activity: Never  Lifestyle  . Physical activity:    Days per week: Not on file    Minutes per session: Not on file  . Stress: Not on file  Relationships  . Social connections:    Talks on phone: Not on file    Gets together: Not on file    Attends religious service: Not on file    Active member of club or organization: Not on file    Attends meetings of clubs or organizations: Not on file    Relationship status: Not on file  Other Topics Concern  . Not on file  Social History Narrative  . Not on file     Family History: The patient's ***family history includes Aneurysm in her brother and mother; Anuerysm in her father; Cancer in her daughter; Heart disease in her father; Hyperlipidemia in her father and mother; Hypertension in her sister; Lung cancer in her mother; Ovarian cancer in her daughter;  Stroke in her father; Tremor in her father. There is no history of Anesthesia problems, Arthritis, Asthma, Cerebral palsy, Clotting disorder, Deep vein thrombosis, Club foot, Collagen disease, Diabetes, Gait disorder, Gout, Hip dysplasia, Hip fracture, Hypermobility, Osteoporosis, Pulmonary embolism, Scoliosis, Spina bifida, Thyroid disease, Vasculitis, or Rheumatologic disease. ROS:   Please see the history of present illness.    All other systems reviewed and are negative.  EKGs/Labs/Other Studies Reviewed:    The following studies were reviewed today:  EKG:  EKG ordered today.  The ekg ordered today demonstrates ***  Recent Labs: 09/19/2017: Magnesium 2.5 03/24/2018: ALT 6; BUN 55; Creatinine, Ser 3.20; Hemoglobin 10.0; Platelets 489; Potassium 5.1; Sodium 141; TSH 1.440  Recent Lipid Panel    Component Value Date/Time   CHOL 156 09/02/2017 0212   TRIG 41 09/02/2017 0212   HDL 47 09/02/2017 0212   CHOLHDL 3.3 09/02/2017 0212   VLDL 8 09/02/2017 0212   LDLCALC 101 (H) 09/02/2017 0212    Physical Exam:    VS:  There were no vitals taken for this visit.    Wt Readings from Last 3 Encounters:  03/24/18 139 lb (63 kg)  11/23/17 139 lb (63 kg)  11/22/17 131 lb (59.4 kg)     GEN: *** Well nourished, well developed in no acute distress HEENT: Normal NECK: No JVD; No carotid bruits LYMPHATICS: No lymphadenopathy CARDIAC: ***RRR, no murmurs, rubs, gallops RESPIRATORY:  Clear to auscultation without rales, wheezing or rhonchi  ABDOMEN: Soft, non-tender, non-distended MUSCULOSKELETAL:  No edema; No deformity  SKIN: Warm and dry NEUROLOGIC:  Alert and oriented x 3 PSYCHIATRIC:  Normal affect    Signed, Shirlee More, MD  06/23/2018 2:31 PM    [facility]

## 2018-06-26 ENCOUNTER — Ambulatory Visit: Payer: Medicare Other | Admitting: Cardiology

## 2018-06-27 ENCOUNTER — Ambulatory Visit: Payer: Medicare Other | Admitting: Cardiology

## 2018-06-27 ENCOUNTER — Encounter: Payer: Self-pay | Admitting: Cardiology

## 2018-06-27 VITALS — BP 110/70 | HR 70 | Ht 64.0 in | Wt 128.6 lb

## 2018-06-27 DIAGNOSIS — I119 Hypertensive heart disease without heart failure: Secondary | ICD-10-CM | POA: Diagnosis not present

## 2018-06-27 DIAGNOSIS — I480 Paroxysmal atrial fibrillation: Secondary | ICD-10-CM | POA: Diagnosis not present

## 2018-06-27 MED ORDER — CLOPIDOGREL BISULFATE 75 MG PO TABS: 75.0000 mg | ORAL_TABLET | Freq: Every day | ORAL | 3 refills | Status: DC

## 2018-06-27 NOTE — Progress Notes (Signed)
Cardiology Office Note:    Date:  06/27/2018   ID:  Caroline Blake, DOB 04-08-1946, MRN [id number]  PCP:  Drake Leach, MD  Cardiologist:  Shirlee More, MD    Referring MD: Drake Leach, MD    ASSESSMENT:    1. Paroxysmal atrial fibrillation (HCC)   2. Hypertensive heart disease, unspecified whether heart failure present    PLAN:    In order of problems listed above:  1. Unfortunately she has had another stroke and the options for treatment are quite limited I discussed with the patient and her husband the option of low-dose Eliquis as opposed to dual antiplatelet therapy they feel there is more harm than anticoagulants opt for dual antiplatelet therapy and will initiated today.  She is Artie had intervention with a lariat procedure for occlusion of her atrial appendage.  While in hospital MRA showed no cerebrovascular extracranial carotid disease.  She was in sinus rhythm during the admission 2. Stable blood pressure target 3. She is on amiodarone for paroxysmal atrial fibrillation we will continue the same unfortunately there is no assurance of antiarrhythmic drug therapy prevents embolic stroke. 4. Present also shows me lab work with a creatinine greater than 3 GFR 15 cc with very severe CKD.   Next appointment: 6 months   Medication Adjustments/Labs and Tests Ordered: Current medicines are reviewed at length with the patient today.  Concerns regarding medicines are outlined above.  No orders of the defined types were placed in this encounter.  Meds ordered this encounter  Medications  . clopidogrel (PLAVIX) 75 MG tablet    Sig: Take 1 tablet (75 mg total) by mouth daily.    Dispense:  30 tablet    Refill:  3    No chief complaint on file.   History of Present Illness:    Caroline Blake is a 72 y.o. female with a hx of CAD status post stent, COPD, smoker, hypertension, kidney cancer status post unilateral nephrectomy.  She also has history of a stroke with  right temporal and left subcortical infarcts.  Her loop recorder inserted and found to have A. fib in 09/2016.  Put on Xarelto.  However, patient started to have GI bleeding with anemia requiring for blood transfusion.  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 last seen 11/22/17. She underwent Lariat procedure for LAA oclusion at [facility] June 2019.  She was seen by him 03/02/18 and he stopped her DAPT. CAD status post stent, COPD, smoker, hypertension, kidney cancer status post unilateral nephrectomy. She also has history of a stroke with right temporal and left subcortical infarcts. Her loop recorder inserted and found to have A. fib in 09/2016. Put on Xarelto. However, patient started to have GI bleeding with anemia requiring for blood transfusion. 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 last seen 11/22/17. She underwent Lariat procedure for LAA oclusion at [facility] June 2019.  She was seen by him 03/02/18 and he stopped her DAPT. She had a recent stroke acute at [facility].    She was  last seen by me 03/24/18. Compliance with diet, lifestyle and medications: Yes  Unfortunately she sustained another stroke had multiple imaging studies were performed and a discussion about either dual antiplatelet therapy oral anticoagulant.  Patient has been her most worried about bleeding and elected aspirin low-dose and clopidogrel that will initiate today no  palpitation chest pain edema or shortness of breath Past Medical History:  Diagnosis Date  . Anemia   . Arthritis of ankle or foot, degenerative 04/30/2016  . Atrial fibrillation ([place]) 09/2016   with left atrial appendage, failed xarelto due to GIB, unable to have watchman due to nickel allergy and Coumadin never started  . AVM (arteriovenous malformation) of colon with hemorrhage 2018   requiring transfusion --- last one was  March 2018 - HgB on 10/22/2016 was 7.3 and on 10/23/2016 was 8.7  . Bacteriuria with pyuria 08/14/2015  . Benign hypertension 1995  . Cancer of kidney ([place])   . Cholelithiasis 2008  . Chronic conjunctivitis 07/04/2012  . Chronic kidney disease   . Clostridium difficile colitis 2017  . Cognitive impairment   . Colon polyp 2005   and 11/16/2016  . COPD (chronic obstructive pulmonary disease) ([place])    and currenlty smokes--- refuses to use HS home O2  . Coronary artery disease 2002  . CVA (cerebral vascular accident) ([place])   . Cystitis    INTESTINAL CHRONIC  . DDD (degenerative disc disease), cervical   . Depression   . Diabetes mellitus ([place]) 1999   HGBA1C on 09/09/2016 was 6.8  . Disease of thyroid gland   . History of kidney cancer    LEFT  . History of pancreatitis   . History of transfusion 10/2016  . Hyperlipidemia   . Hypertension   . Hypothyroidism   . Iron deficiency anemia 09/27/2016  . Left homonymous hemianopsia 07/04/2012  . Lisfranc dislocation, right, initial encounter 08/14/2017  . Memory impairment 09/09/2016  . Nuclear sclerosis of left eye 12/09/2016   added automatically from request for surgery [phone]  . OSA (obstructive sleep apnea)    o2 3L [place] PRN  . Pancreatitis 2008  . Posterior vitreous detachment 07/04/2012  . Postsurgical hypothyroidism    S/P partial thyroidectomy 1978, later went for additional parital thyroidectomy  . Renal cell adenocarcinoma, left ([place]) 2005   [place]  . Retinal drusen 07/04/2012  . Shortness of breath   . Sinus tarsitis of left foot 09/15/2016  . Snores 2005   does not use CPAP regularly  . Stroke [facility]) 2011   LLE sensory, 2012 x2 and 2013 x1; [place]  . TIA (transient ischemic attack) 10/21/2016  . Type 2 diabetes mellitus ([place])   . Type 2 diabetes mellitus with stage 3 chronic kidney disease, with long-term current use of insulin ([place])    on 10/23/2016 Scr was 1.89, GFR 26 and K+ 4.2    Past Surgical History:  Procedure  Laterality Date  . CATARACT EXTRACTION    . CATARACT EXTRACTION W/ INTRAOCULAR LENS IMPLANT Left 01/19/2017   Procedure: PHACOEMULSIFICATION PC / IOL TOPICAL; Surgeon: Sonia Side Burden, MD; Location: [place]; Service: Ophthalmology; Laterality: Left;  . CHOLECYSTECTOMY    . COLONOSCOPY Left 09/22/2016   Procedure: COLONOSCOPY, FLEXIBLE, PROXIMAL TO SPLENIC FLEXURE; DIAGNOSTIC, W/WO COLLECTION SPECIMEN BY BRUSH OR [place]; Surgeon: Ned Card, MD; Location: Endo Procedures [facility]; Service: Gastroenterology  . COLONOSCOPY  11/16/2016   Procedure: COLONOSCOPY, FLEXIBLE, PROXIMAL TO SPLENIC FLEXURE; DIAGNOSTIC, W/WO COLLECTION SPECIMEN BY BRUSH OR [place]; Surgeon: Ned Card, MD; Location: Endo Procedures [facility]; Service: Gastroenterology  . COLONOSCOPY W/ BIOPSIES  10/07/2016   Procedure: COLONOSCOPY, FLEXIBLE, PROXIMAL TO SPLENIC FLEXURE; WITH BIOPSY, SINGLE OR MULTIPLE; Surgeon: Bing Quarry, MD; Location: Endo Procedures [facility]; Service: Gastroenterology  . CORONARY STENT PLACEMENT  2002  . GALLBLADDER SURGERY  2008  .  LOOP RECORDER IMPLANT  2018   medtronic reveal linq  . NEPHRECTOMY Left 2005   renal cancer   . THYROIDECTOMY, PARTIAL  1978   x2  . TOTAL ABDOMINAL HYSTERECTOMY W/ BILATERAL SALPINGOOPHORECTOMY  1984  . TUBAL LIGATION  1980  . UPPER GASTROINTESTINAL ENDOSCOPY Left 09/22/2016   Procedure: UGI ENDO, INCLUDE ESOPHAGUS, STOMACH, & DUODENUM &/OR JEJUNUM; DX W/WO COLLECTION SPECIMN, BY BRUSH OR [place]; Surgeon: Ned Card, MD; Location: Endo Procedures [place]; Service: Gastroenterology    Current Medications: Current Meds  Medication Sig  . amiodarone (PACERONE) 200 MG tablet Take 200 mg by mouth daily.  Marland Kitchen amLODipine (NORVASC) 5 MG tablet Take 5 mg by mouth daily.  Marland Kitchen aspirin EC 81 MG tablet Take 1 tablet (81 mg total) by mouth daily.  Marland Kitchen buPROPion (WELLBUTRIN SR) 150 MG 12 hr tablet Take 1 tablet by mouth daily.  . Calcium Carb-Cholecalciferol  (OYSTERCAL-D) 500-400 MG-UNIT TABS Take 1 tablet by mouth daily.  Marland Kitchen donepezil (ARICEPT) 10 MG tablet Take 1 mg by mouth daily.   Marland Kitchen ezetimibe (ZETIA) 10 MG tablet Take 10 mg by mouth daily.  . ferrous sulfate 325 (65 FE) MG tablet Take 1 tablet by mouth daily.  . Insulin Lispro Prot & Lispro (HUMALOG MIX 75/25 KWIKPEN) (75-25) 100 UNIT/ML Kwikpen Inject 3 Units into the skin 2 (two) times daily.  Marland Kitchen lamoTRIgine (LAMICTAL) 150 MG tablet Take 150 mg by mouth 2 (two) times daily.  Marland Kitchen levothyroxine (SYNTHROID, LEVOTHROID) 150 MCG tablet Take 150 mcg by mouth daily.  Marland Kitchen LORazepam (ATIVAN) 0.5 MG tablet Take 0.5 mg by mouth 2 (two) times daily.  Marland Kitchen nystatin (NYSTATIN) powder Apply 1 g topically 2 (two) times daily.  . pravastatin (PRAVACHOL) 80 MG tablet Take 80 mg by mouth daily.  . sertraline (ZOLOFT) 100 MG tablet Take 200 mg by mouth daily.  . traZODone (DESYREL) 50 MG tablet Take 50 mg by mouth at bedtime. Take an additional 0.5 Tablet as needed for insomnia  . vitamin B-12 (CYANOCOBALAMIN) 500 MCG tablet Take 500 mcg by mouth daily.  . vitamin C (ASCORBIC ACID) 500 MG tablet Take 500 mg by mouth daily.     Allergies:   Atorvastatin; Carvedilol; Cinoxacin; Etodolac; Metoclopramide; Moxifloxacin; Rivaroxaban; Rosuvastatin; Yellow dye; Nitrofurantoin; Paroxetine hcl; Rosuvastatin calcium; Fluoxetine; Levetiracetam; Sulfa antibiotics; Yellow dyes (non-tartrazine); Aspirin-dipyridamole er; Ezetimibe-simvastatin; and Pitavastatin   Social History   Socioeconomic History  . Marital status: Married    Spouse name: Not on file  . Number of children: Not on file  . Years of education: Not on file  . Highest education level: Not on file  Occupational History  . Not on file  Social Needs  . Financial resource strain: Not on file  . Food insecurity:    Worry: Not on file    Inability: Not on file  . Transportation needs:    Medical: Not on file    Non-medical: Not on file  Tobacco Use  . Smoking  status: Former Smoker    Packs/day: 1.00    Years: 50.00    Pack years: 50.00    Types: Cigarettes    Last attempt to quit: 09/01/2017    Years since quitting: 0.8  . Smokeless tobacco: Never Used  Substance and Sexual Activity  . Alcohol use: No    Frequency: Never  . Drug use: Not Currently  . Sexual activity: Never  Lifestyle  . Physical activity:    Days per week: Not on file  Minutes per session: Not on file  . Stress: Not on file  Relationships  . Social connections:    Talks on phone: Not on file    Gets together: Not on file    Attends religious service: Not on file    Active member of club or organization: Not on file    Attends meetings of clubs or organizations: Not on file    Relationship status: Not on file  Other Topics Concern  . Not on file  Social History Narrative  . Not on file     Family History: The patient's family history includes Aneurysm in her brother and mother; Anuerysm in her father; Cancer in her daughter; Heart disease in her father; Hyperlipidemia in her father and mother; Hypertension in her sister; Lung cancer in her mother; Ovarian cancer in her daughter; Stroke in her father; Tremor in her father. There is no history of Anesthesia problems, Arthritis, Asthma, Cerebral palsy, Clotting disorder, Deep vein thrombosis, Club foot, Collagen disease, Diabetes, Gait disorder, Gout, Hip dysplasia, Hip fracture, Hypermobility, Osteoporosis, Pulmonary embolism, Scoliosis, Spina bifida, Thyroid disease, Vasculitis, or Rheumatologic disease. ROS:   Please see the history of present illness.    All other systems reviewed and are negative.  EKGs/Labs/Other Studies Reviewed:    The following studies were reviewed today:   Recent Labs: 09/19/2017: Magnesium 2.5 03/24/2018: ALT 6; BUN 55; Creatinine, Ser 3.20; Hemoglobin 10.0; Platelets 489; Potassium 5.1; Sodium 141; TSH 1.440  Recent Lipid Panel    Component Value Date/Time   CHOL 156 09/02/2017  0212   TRIG 41 09/02/2017 0212   HDL 47 09/02/2017 0212   CHOLHDL 3.3 09/02/2017 0212   VLDL 8 09/02/2017 0212   LDLCALC 101 (H) 09/02/2017 0212    Physical Exam:    VS:  BP 110/70 (BP Location: Left Arm, Patient Position: Sitting, Cuff Size: Normal)   Pulse 70   Ht 5\' 4"  (1.626 m)   Wt 128 lb 9.6 oz (58.3 kg) Comment: patient in wheelchair  SpO2 92%   BMI 22.07 kg/m     Wt Readings from Last 3 Encounters:  06/27/18 128 lb 9.6 oz (58.3 kg)  03/24/18 139 lb (63 kg)  11/23/17 139 lb (63 kg)     GEN:  Well nourished, well developed in no acute distress HEENT: Normal NECK: No JVD; No carotid bruits LYMPHATICS: No lymphadenopathy CARDIAC: RRR, no murmurs, rubs, gallops RESPIRATORY:  Clear to auscultation without rales, wheezing or rhonchi  ABDOMEN: Soft, non-tender, non-distended MUSCULOSKELETAL:  No edema; No deformity  SKIN: Warm and dry NEUROLOGIC:  Alert and oriented x 3 PSYCHIATRIC:  Normal affect    Signed, Shirlee More, MD  06/27/2018 4:57 PM    [facility]

## 2018-06-27 NOTE — Patient Instructions (Signed)
Medication Instructions:  Your physician has recommended you make the following change in your medication:   START clopidogrel (plavix) 75 mg: Take 1 tablet daily   If you need a refill on your cardiac medications before your next appointment, please call your pharmacy.   Lab work: None  If you have labs (blood work) drawn today and your tests are completely normal, you will receive your results only by: Marland Kitchen MyChart Message (if you have MyChart) OR . A paper copy in the mail If you have any lab test that is abnormal or we need to change your treatment, we will call you to review the results.  Testing/Procedures: None  Follow-Up: At [facility], you and your health needs are our priority.  As part of our continuing mission to provide you with exceptional heart care, we have created designated Provider Care Teams.  These Care Teams include your primary Cardiologist (physician) and Advanced Practice Providers (APPs -  Physician Assistants and Nurse Practitioners) who all work together to provide you with the care you need, when you need it. You will need a follow up appointment in 6 months.  Please call our office 2 months in advance to schedule this appointment.     Clopidogrel tablets What is this medicine? CLOPIDOGREL (kloh PID oh grel) helps to prevent blood clots. This medicine is used to prevent heart attack, stroke, or other vascular events in people who are at high risk. This medicine may be used for other purposes; ask your health care provider or pharmacist if you have questions. COMMON BRAND NAME(S): Plavix What should I tell my health care provider before I take this medicine? They need to know if you have any of the following conditions: -bleeding disorders -bleeding in the brain -having surgery -history of stomach bleeding -an unusual or allergic reaction to clopidogrel, other medicines, foods, dyes, or preservatives -pregnant or trying to get  pregnant -breast-feeding How should I use this medicine? Take this medicine by mouth with a glass of water. Follow the directions on the prescription label. You may take this medicine with or without food. If it upsets your stomach, take it with food. Take your medicine at regular intervals. Do not take it more often than directed. Do not stop taking except on your doctor's advice. A special MedGuide will be given to you by the pharmacist with each prescription and refill. Be sure to read this information carefully each time. Talk to your pediatrician regarding the use of this medicine in children. Special care may be needed. Overdosage: If you think you have taken too much of this medicine contact a poison control center or emergency room at once. NOTE: This medicine is only for you. Do not share this medicine with others. What if I miss a dose? If you miss a dose, take it as soon as you can. If it is almost time for your next dose, take only that dose. Do not take double or extra doses. What may interact with this medicine? Do not take this medicine with the following medications: -dasabuvir; ombitasvir; paritaprevir; ritonavir -defibrotide This medicine may also interact with the following medications: -antiviral medicines for HIV or AIDS -aspirin -certain medicines for depression like citalopram, fluoxetine, fluvoxamine -certain medicines for fungal infections like ketoconazole, fluconazole, voriconazole -certain medicines for seizures like felbamate, oxcarbazepine, phenytoin -certain medicines for stomach problems like cimetidine, omeprazole, esomeprazole -certain medicines that treat or prevent blood clots like warfarin, enoxaparin, dalteparin, apixaban, dabigatran, rivaroxaban, ticlopidine -chloramphenicol -cilostazol -fluvastatin -isoniazid -modafinil -  nicardipine -NSAIDS, medicines for pain and inflammation, like ibuprofen or  naproxen -quinine -repaglinide -tamoxifen -tolbutamide -topiramate -torsemide This list may not describe all possible interactions. Give your health care provider a list of all the medicines, herbs, non-prescription drugs, or dietary supplements you use. Also tell them if you smoke, drink alcohol, or use illegal drugs. Some items may interact with your medicine. What should I watch for while using this medicine? Visit your doctor or health care professional for regular check ups. Do not stop taking your medicine unless your doctor tells you to. Notify your doctor or health care professional and seek emergency treatment if you develop breathing problems; changes in vision; chest pain; severe, sudden headache; pain, swelling, warmth in the leg; trouble speaking; sudden numbness or weakness of the face, arm or leg. These can be signs that your condition has gotten worse. If you are going to have surgery or dental work, tell your doctor or health care professional that you are taking this medicine. Certain genetic factors may reduce the effect of this medicine. Your doctor may use genetic tests to determine treatment. What side effects may I notice from receiving this medicine? Side effects that you should report to your doctor or health care professional as soon as possible: -allergic reactions like skin rash, itching or hives, swelling of the face, lips, or tongue -signs and symptoms of bleeding such as bloody or black, tarry stools; red or dark-brown urine; spitting up blood or brown material that looks like coffee grounds; red spots on the skin; unusual bruising or bleeding from the eye, gums, or nose -signs and symptoms of a blood clot such as breathing problems; changes in vision; chest pain; severe, sudden headache; pain, swelling, warmth in the leg; trouble speaking; sudden numbness or weakness of the face, arm or leg Side effects that usually do not require medical attention (report to your  doctor or health care professional if they continue or are bothersome): -constipation -diarrhea -headache -upset stomach This list may not describe all possible side effects. Call your doctor for medical advice about side effects. You may report side effects to FDA at 1-800-FDA-1088. Where should I keep my medicine? Keep out of the reach of children. Store at room temperature of 59 to 86 degrees F (15 to 30 degrees C). Throw away any unused medicine after the expiration date. NOTE: This sheet is a summary. It may not cover all possible information. If you have questions about this medicine, talk to your doctor, pharmacist, or health care provider.  2018 Elsevier/Gold Standard (2015-05-01 10:00:44)

## 2018-07-12 ENCOUNTER — Other Ambulatory Visit: Payer: Self-pay

## 2018-07-12 ENCOUNTER — Emergency Department (HOSPITAL_COMMUNITY): Payer: Medicare Other

## 2018-07-12 ENCOUNTER — Encounter (HOSPITAL_COMMUNITY): Payer: Self-pay

## 2018-07-12 ENCOUNTER — Inpatient Hospital Stay (HOSPITAL_COMMUNITY): Payer: Medicare Other

## 2018-07-12 ENCOUNTER — Inpatient Hospital Stay (HOSPITAL_COMMUNITY)
Admission: EM | Admit: 2018-07-12 | Discharge: 2018-07-13 | DRG: 101 | Disposition: A | Discharge status: Hospice | Payer: Medicare Other | Attending: Student in an Organized Health Care Education/Training Program | Admitting: Student in an Organized Health Care Education/Training Program

## 2018-07-12 DIAGNOSIS — Z79890 Hormone replacement therapy (postmenopausal): Secondary | ICD-10-CM

## 2018-07-12 DIAGNOSIS — I639 Cerebral infarction, unspecified: Secondary | ICD-10-CM | POA: Diagnosis present

## 2018-07-12 DIAGNOSIS — I69354 Hemiplegia and hemiparesis following cerebral infarction affecting left non-dominant side: Secondary | ICD-10-CM

## 2018-07-12 DIAGNOSIS — N183 Chronic kidney disease, stage 3 (moderate): Secondary | ICD-10-CM

## 2018-07-12 DIAGNOSIS — R29810 Facial weakness: Secondary | ICD-10-CM | POA: Diagnosis present

## 2018-07-12 DIAGNOSIS — Z7189 Other specified counseling: Secondary | ICD-10-CM

## 2018-07-12 DIAGNOSIS — Z91048 Other nonmedicinal substance allergy status: Secondary | ICD-10-CM

## 2018-07-12 DIAGNOSIS — K922 Gastrointestinal hemorrhage, unspecified: Secondary | ICD-10-CM

## 2018-07-12 DIAGNOSIS — R251 Tremor, unspecified: Secondary | ICD-10-CM | POA: Diagnosis present

## 2018-07-12 DIAGNOSIS — C649 Malignant neoplasm of unspecified kidney, except renal pelvis: Secondary | ICD-10-CM

## 2018-07-12 DIAGNOSIS — Z66 Do not resuscitate: Secondary | ICD-10-CM | POA: Diagnosis present

## 2018-07-12 DIAGNOSIS — Z85528 Personal history of other malignant neoplasm of kidney: Secondary | ICD-10-CM

## 2018-07-12 DIAGNOSIS — E785 Hyperlipidemia, unspecified: Secondary | ICD-10-CM

## 2018-07-12 DIAGNOSIS — M19079 Primary osteoarthritis, unspecified ankle and foot: Secondary | ICD-10-CM | POA: Diagnosis present

## 2018-07-12 DIAGNOSIS — Z881 Allergy status to other antibiotic agents status: Secondary | ICD-10-CM

## 2018-07-12 DIAGNOSIS — Z9851 Tubal ligation status: Secondary | ICD-10-CM

## 2018-07-12 DIAGNOSIS — M5030 Other cervical disc degeneration, unspecified cervical region: Secondary | ICD-10-CM | POA: Diagnosis present

## 2018-07-12 DIAGNOSIS — G9340 Encephalopathy, unspecified: Secondary | ICD-10-CM | POA: Diagnosis present

## 2018-07-12 DIAGNOSIS — I2510 Atherosclerotic heart disease of native coronary artery without angina pectoris: Secondary | ICD-10-CM | POA: Diagnosis present

## 2018-07-12 DIAGNOSIS — G40901 Epilepsy, unspecified, not intractable, with status epilepticus: Secondary | ICD-10-CM | POA: Diagnosis not present

## 2018-07-12 DIAGNOSIS — E860 Dehydration: Secondary | ICD-10-CM

## 2018-07-12 DIAGNOSIS — I480 Paroxysmal atrial fibrillation: Secondary | ICD-10-CM | POA: Diagnosis present

## 2018-07-12 DIAGNOSIS — Z7982 Long term (current) use of aspirin: Secondary | ICD-10-CM

## 2018-07-12 DIAGNOSIS — I69398 Other sequelae of cerebral infarction: Secondary | ICD-10-CM | POA: Diagnosis not present

## 2018-07-12 DIAGNOSIS — Z8249 Family history of ischemic heart disease and other diseases of the circulatory system: Secondary | ICD-10-CM

## 2018-07-12 DIAGNOSIS — E1122 Type 2 diabetes mellitus with diabetic chronic kidney disease: Secondary | ICD-10-CM | POA: Diagnosis present

## 2018-07-12 DIAGNOSIS — Z7401 Bed confinement status: Secondary | ICD-10-CM

## 2018-07-12 DIAGNOSIS — J449 Chronic obstructive pulmonary disease, unspecified: Secondary | ICD-10-CM

## 2018-07-12 DIAGNOSIS — N179 Acute kidney failure, unspecified: Secondary | ICD-10-CM | POA: Diagnosis not present

## 2018-07-12 DIAGNOSIS — R4701 Aphasia: Secondary | ICD-10-CM | POA: Diagnosis present

## 2018-07-12 DIAGNOSIS — I129 Hypertensive chronic kidney disease with stage 1 through stage 4 chronic kidney disease, or unspecified chronic kidney disease: Secondary | ICD-10-CM | POA: Diagnosis present

## 2018-07-12 DIAGNOSIS — Z87891 Personal history of nicotine dependence: Secondary | ICD-10-CM

## 2018-07-12 DIAGNOSIS — Z9079 Acquired absence of other genital organ(s): Secondary | ICD-10-CM

## 2018-07-12 DIAGNOSIS — Z9049 Acquired absence of other specified parts of digestive tract: Secondary | ICD-10-CM

## 2018-07-12 DIAGNOSIS — E890 Postprocedural hypothyroidism: Secondary | ICD-10-CM | POA: Diagnosis present

## 2018-07-12 DIAGNOSIS — E039 Hypothyroidism, unspecified: Secondary | ICD-10-CM | POA: Diagnosis not present

## 2018-07-12 DIAGNOSIS — N184 Chronic kidney disease, stage 4 (severe): Secondary | ICD-10-CM | POA: Diagnosis present

## 2018-07-12 DIAGNOSIS — Z888 Allergy status to other drugs, medicaments and biological substances status: Secondary | ICD-10-CM

## 2018-07-12 DIAGNOSIS — Z905 Acquired absence of kidney: Secondary | ICD-10-CM

## 2018-07-12 DIAGNOSIS — Z8041 Family history of malignant neoplasm of ovary: Secondary | ICD-10-CM

## 2018-07-12 DIAGNOSIS — Q211 Atrial septal defect: Secondary | ICD-10-CM

## 2018-07-12 DIAGNOSIS — F0150 Vascular dementia without behavioral disturbance: Secondary | ICD-10-CM | POA: Diagnosis present

## 2018-07-12 DIAGNOSIS — Z90710 Acquired absence of both cervix and uterus: Secondary | ICD-10-CM

## 2018-07-12 DIAGNOSIS — Z515 Encounter for palliative care: Secondary | ICD-10-CM | POA: Diagnosis present

## 2018-07-12 DIAGNOSIS — Z823 Family history of stroke: Secondary | ICD-10-CM

## 2018-07-12 DIAGNOSIS — G40101 Localization-related (focal) (partial) symptomatic epilepsy and epileptic syndromes with simple partial seizures, not intractable, with status epilepticus: Secondary | ICD-10-CM | POA: Diagnosis not present

## 2018-07-12 DIAGNOSIS — E870 Hyperosmolality and hypernatremia: Secondary | ICD-10-CM | POA: Diagnosis present

## 2018-07-12 DIAGNOSIS — R4182 Altered mental status, unspecified: Secondary | ICD-10-CM

## 2018-07-12 DIAGNOSIS — Z801 Family history of malignant neoplasm of trachea, bronchus and lung: Secondary | ICD-10-CM

## 2018-07-12 DIAGNOSIS — Z961 Presence of intraocular lens: Secondary | ICD-10-CM | POA: Diagnosis present

## 2018-07-12 DIAGNOSIS — I69351 Hemiplegia and hemiparesis following cerebral infarction affecting right dominant side: Secondary | ICD-10-CM

## 2018-07-12 DIAGNOSIS — Z7902 Long term (current) use of antithrombotics/antiplatelets: Secondary | ICD-10-CM

## 2018-07-12 DIAGNOSIS — R627 Adult failure to thrive: Secondary | ICD-10-CM | POA: Diagnosis present

## 2018-07-12 DIAGNOSIS — Z8553 Personal history of malignant neoplasm of renal pelvis: Secondary | ICD-10-CM

## 2018-07-12 DIAGNOSIS — G4733 Obstructive sleep apnea (adult) (pediatric): Secondary | ICD-10-CM | POA: Diagnosis present

## 2018-07-12 DIAGNOSIS — Z9842 Cataract extraction status, left eye: Secondary | ICD-10-CM

## 2018-07-12 DIAGNOSIS — Z955 Presence of coronary angioplasty implant and graft: Secondary | ICD-10-CM

## 2018-07-12 DIAGNOSIS — Z8349 Family history of other endocrine, nutritional and metabolic diseases: Secondary | ICD-10-CM

## 2018-07-12 DIAGNOSIS — Z794 Long term (current) use of insulin: Secondary | ICD-10-CM

## 2018-07-12 DIAGNOSIS — Z882 Allergy status to sulfonamides status: Secondary | ICD-10-CM

## 2018-07-12 DIAGNOSIS — Z79899 Other long term (current) drug therapy: Secondary | ICD-10-CM

## 2018-07-12 DIAGNOSIS — Q2733 Arteriovenous malformation of digestive system vessel: Secondary | ICD-10-CM

## 2018-07-12 LAB — COMPREHENSIVE METABOLIC PANEL
ALT: 85 U/L — ABNORMAL HIGH (ref 0–44)
AST: 55 U/L — ABNORMAL HIGH (ref 15–41)
Albumin: 4 g/dL (ref 3.5–5.0)
Alkaline Phosphatase: 57 U/L (ref 38–126)
BUN: 120 mg/dL — ABNORMAL HIGH (ref 8–23)
CO2: 17 mmol/L — AB (ref 22–32)
Calcium: 8.4 mg/dL — ABNORMAL LOW (ref 8.9–10.3)
Chloride: >130 mmol/L (ref 98–111)
Creatinine, Ser: 5.08 mg/dL — ABNORMAL HIGH (ref 0.44–1.00)
GFR calc Af Amer: 9 mL/min — ABNORMAL LOW (ref >60)
GFR calc non Af Amer: 8 mL/min — ABNORMAL LOW (ref >60)
Glucose, Bld: 131 mg/dL — ABNORMAL HIGH (ref 70–99)
Potassium: 4.9 mmol/L (ref 3.5–5.1)
Sodium: 161 mmol/L (ref 135–145)
Total Bilirubin: 0.4 mg/dL (ref 0.3–1.2)
Total Protein: 7.2 g/dL (ref 6.5–8.1)

## 2018-07-12 LAB — DIFFERENTIAL
Abs Immature Granulocytes: 0.03 10*3/uL (ref 0.00–0.07)
Basophils Absolute: 0.1 10*3/uL (ref 0.0–0.1)
Basophils Relative: 1 %
Eosinophils Absolute: 0.2 10*3/uL (ref 0.0–0.5)
Eosinophils Relative: 1 %
Immature Granulocytes: 0 %
Lymphocytes Relative: 11 %
Lymphs Abs: 1.1 10*3/uL (ref 0.7–4.0)
Monocytes Absolute: 0.7 10*3/uL (ref 0.1–1.0)
Monocytes Relative: 7 %
Neutro Abs: 8.4 10*3/uL — ABNORMAL HIGH (ref 1.7–7.7)
Neutrophils Relative %: 80 %

## 2018-07-12 LAB — PROTIME-INR
INR: 1.08
Prothrombin Time: 13.9 seconds (ref 11.4–15.2)

## 2018-07-12 LAB — URINALYSIS, ROUTINE W REFLEX MICROSCOPIC
Bilirubin Urine: NEGATIVE
Glucose, UA: NEGATIVE mg/dL
Hgb urine dipstick: NEGATIVE
Ketones, ur: NEGATIVE mg/dL
LEUKOCYTES UA: NEGATIVE
Nitrite: NEGATIVE
Protein, ur: NEGATIVE mg/dL
Specific Gravity, Urine: 1.016 (ref 1.005–1.030)
pH: 5 (ref 5.0–8.0)

## 2018-07-12 LAB — BASIC METABOLIC PANEL
BUN: 117 mg/dL — ABNORMAL HIGH (ref 8–23)
CO2: 16 mmol/L — ABNORMAL LOW (ref 22–32)
Calcium: 8.2 mg/dL — ABNORMAL LOW (ref 8.9–10.3)
Chloride: >130 mmol/L (ref 98–111)
Creatinine, Ser: 5.16 mg/dL — ABNORMAL HIGH (ref 0.44–1.00)
GFR calc Af Amer: 9 mL/min — ABNORMAL LOW (ref >60)
GFR calc non Af Amer: 8 mL/min — ABNORMAL LOW (ref >60)
Glucose, Bld: 151 mg/dL — ABNORMAL HIGH (ref 70–99)
POTASSIUM: 4.6 mmol/L (ref 3.5–5.1)
Sodium: 163 mmol/L (ref 135–145)

## 2018-07-12 LAB — I-STAT CHEM 8, ED
BUN: 117 mg/dL — ABNORMAL HIGH (ref 8–23)
Calcium, Ion: 1.06 mmol/L — ABNORMAL LOW (ref 1.15–1.40)
Chloride: 135 mmol/L (ref 98–111)
Creatinine, Ser: 5.2 mg/dL — ABNORMAL HIGH (ref 0.44–1.00)
Glucose, Bld: 126 mg/dL — ABNORMAL HIGH (ref 70–99)
HEMATOCRIT: 39 % (ref 36.0–46.0)
Hemoglobin: 13.3 g/dL (ref 12.0–15.0)
Potassium: 4.8 mmol/L (ref 3.5–5.1)
Sodium: 163 mmol/L (ref 135–145)
TCO2: 21 mmol/L — ABNORMAL LOW (ref 22–32)

## 2018-07-12 LAB — NA AND K (SODIUM & POTASSIUM), RAND UR
POTASSIUM UR: 40 mmol/L
Sodium, Ur: 41 mmol/L

## 2018-07-12 LAB — CBC
HCT: 44.3 % (ref 36.0–46.0)
Hemoglobin: 12.6 g/dL (ref 12.0–15.0)
MCH: 26.6 pg (ref 26.0–34.0)
MCHC: 28.4 g/dL — ABNORMAL LOW (ref 30.0–36.0)
MCV: 93.7 fL (ref 80.0–100.0)
Platelets: 232 10*3/uL (ref 150–400)
RBC: 4.73 MIL/uL (ref 3.87–5.11)
RDW: 20 % — ABNORMAL HIGH (ref 11.5–15.5)
WBC: 10.4 10*3/uL (ref 4.0–10.5)
nRBC: 0 % (ref 0.0–0.2)

## 2018-07-12 LAB — I-STAT TROPONIN, ED: Troponin i, poc: 0.02 ng/mL (ref 0.00–0.08)

## 2018-07-12 LAB — CBG MONITORING, ED: Glucose-Capillary: 118 mg/dL — ABNORMAL HIGH (ref 70–99)

## 2018-07-12 LAB — APTT: aPTT: 26 seconds (ref 24–36)

## 2018-07-12 LAB — PHENYTOIN LEVEL, TOTAL: Phenytoin Lvl: 19 ug/mL (ref 10.0–20.0)

## 2018-07-12 LAB — CREATININE, URINE, RANDOM: Creatinine, Urine: 134.45 mg/dL

## 2018-07-12 MED ORDER — ONDANSETRON HCL 4 MG/2ML IJ SOLN: 4.0000 mg | Freq: Four times a day (QID) | INTRAMUSCULAR | Status: DC | PRN

## 2018-07-12 MED ORDER — GLYCOPYRROLATE 1 MG PO TABS
1.0000 mg | ORAL_TABLET | ORAL | Status: DC | PRN
  Filled 2018-07-12: qty 1

## 2018-07-12 MED ORDER — POLYVINYL ALCOHOL 1.4 % OP SOLN
1.0000 [drp] | Freq: Four times a day (QID) | OPHTHALMIC | Status: DC | PRN
  Filled 2018-07-12: qty 15

## 2018-07-12 MED ORDER — LORAZEPAM 2 MG/ML IJ SOLN
0.5000 mg | Freq: Once | INTRAMUSCULAR | Status: AC
  Administered 2018-07-12: 0.5 mg via INTRAVENOUS

## 2018-07-12 MED ORDER — HALOPERIDOL 1 MG PO TABS
0.5000 mg | ORAL_TABLET | ORAL | Status: DC | PRN
  Filled 2018-07-12: qty 0.5

## 2018-07-12 MED ORDER — LORAZEPAM 2 MG/ML IJ SOLN
1.0000 mg | Freq: Once | INTRAMUSCULAR | Status: DC
  Filled 2018-07-12: qty 1

## 2018-07-12 MED ORDER — HEPARIN SODIUM (PORCINE) 5000 UNIT/ML IJ SOLN: 5000.0000 [IU] | Freq: Three times a day (TID) | INTRAMUSCULAR | Status: DC

## 2018-07-12 MED ORDER — LORAZEPAM 2 MG/ML IJ SOLN
1.0000 mg | INTRAMUSCULAR | Status: DC | PRN
  Administered 2018-07-12: 1 mg via INTRAVENOUS
  Filled 2018-07-12: qty 1

## 2018-07-12 MED ORDER — BIOTENE DRY MOUTH MT LIQD: 15.0000 mL | OROMUCOSAL | Status: DC | PRN

## 2018-07-12 MED ORDER — SODIUM CHLORIDE 0.9 % IV SOLN
20.0000 mg/kg | INTRAVENOUS | Status: AC
  Administered 2018-07-12: 1160 mg via INTRAVENOUS
  Filled 2018-07-12: qty 10

## 2018-07-12 MED ORDER — THIAMINE HCL 100 MG/ML IJ SOLN: 100.0000 mg | Freq: Every day | INTRAMUSCULAR | Status: DC

## 2018-07-12 MED ORDER — GLYCOPYRROLATE 0.2 MG/ML IJ SOLN: 0.2000 mg | INTRAMUSCULAR | Status: DC | PRN

## 2018-07-12 MED ORDER — DEXTROSE 5 % IV SOLN: INTRAVENOUS | Status: DC

## 2018-07-12 MED ORDER — HALOPERIDOL LACTATE 5 MG/ML IJ SOLN
0.5000 mg | INTRAMUSCULAR | Status: DC | PRN
  Administered 2018-07-13: 0.5 mg via INTRAVENOUS
  Filled 2018-07-12: qty 1

## 2018-07-12 MED ORDER — MORPHINE SULFATE (PF) 4 MG/ML IV SOLN
4.0000 mg | INTRAVENOUS | Status: DC | PRN
  Administered 2018-07-12 – 2018-07-13 (×2): 4 mg via INTRAVENOUS
  Filled 2018-07-12 (×2): qty 1

## 2018-07-12 MED ORDER — PHENYTOIN SODIUM 50 MG/ML IJ SOLN
100.0000 mg | Freq: Three times a day (TID) | INTRAMUSCULAR | Status: DC
  Administered 2018-07-12 – 2018-07-13 (×2): 100 mg via INTRAVENOUS
  Filled 2018-07-12 (×2): qty 2

## 2018-07-12 MED ORDER — HALOPERIDOL LACTATE 2 MG/ML PO CONC
0.5000 mg | ORAL | Status: DC | PRN
  Filled 2018-07-12: qty 0.3

## 2018-07-12 MED ORDER — LORAZEPAM 1 MG PO TABS: 1.0000 mg | ORAL_TABLET | ORAL | Status: DC | PRN

## 2018-07-12 MED ORDER — FOLIC ACID 5 MG/ML IJ SOLN
1.0000 mg | Freq: Every day | INTRAMUSCULAR | Status: DC
  Filled 2018-07-12: qty 0.2

## 2018-07-12 MED ORDER — LORAZEPAM 2 MG/ML PO CONC
1.0000 mg | ORAL | Status: DC | PRN
  Filled 2018-07-12: qty 0.5

## 2018-07-12 MED ORDER — ACETAMINOPHEN 650 MG RE SUPP: 650.0000 mg | Freq: Four times a day (QID) | RECTAL | Status: DC | PRN

## 2018-07-12 MED ORDER — ONDANSETRON 4 MG PO TBDP: 4.0000 mg | ORAL_TABLET | Freq: Four times a day (QID) | ORAL | Status: DC | PRN

## 2018-07-12 MED ORDER — ACETAMINOPHEN 325 MG PO TABS: 650.0000 mg | ORAL_TABLET | Freq: Four times a day (QID) | ORAL | Status: DC | PRN

## 2018-07-12 NOTE — Code Documentation (Addendum)
72 yo female coming from nursing facility with complaints of inability to speak, right facial droop, and left arm and leg weakness. Pt has hx of Afib, PFO, TIAs, and stroke along with some behavioral hx and hx of seizure. Code Stroke called by EMS. Stroke Team met patient upon arrival. Initial NIHSS 22 due to inability to answer questions or follow commands, left gaze preference, right facial droop, left arm drift, right arm weaker than the left, bilateral leg weakness, decreased sensory on the right, aphasia, and dysarthria. Pt has a baseline NIHSS 5. CT Head completed and showed no sign of hemorrhage. Pt's Renal function is not adequate for CTA. mRS 3, pt not a candidate for IR. Pt brought back to the room and started to have rhythmic tremor of her right arm. MD Rory Percy made aware. STAT EEG ordered. 0.5 mg of Ativan given. Placed on Cardiac monitor. Handoff given to [place], Therapist, sports. Pt to have MRI Completed.

## 2018-07-12 NOTE — Progress Notes (Signed)
ELECTROENCEPHALOGRAM REPORT Date of Study: 07/12/18  ZOX:[id number]   Clinical History:  72 yo female coming from nursing facility with complaints of inability to speak, right facial droop, and left arm and leg weakness. Pt has hx of Afib, PFO, TIAs, and stroke along with some behavioral hx and hx of seizure. Code Stroke called by EMS. Stroke Team met patient upon arrival. Initial NIHSS 22, pt had  left gaze preference, right facial droop, left arm drift, right arm weaker than the left, bilateral leg weakness. In ED room she started to have rhythmic tremor of her right arm. 0.5 mg of Ativan given.   Medications:Lamictal, ativan   Technical Summary:  A multichannel digital EEG recording measured by the international 10-20 system with electrodes applied with paste and impedances below 5000 ohms performed in our laboratory with EKG monitoring in an awake and asleep patient. Hyperventilation and photic stimulation were not performed. The digital EEG was referentially recorded, reformatted, and digitally filtered in a variety of bipolar and referential montages for optimal display.  Description:  The patient was asleep during the recording. The record is not symmetric with more focal slowing on the R hemisphere. Throughout the recording, there were polyspikes that were very rhythmic at times that build up to form multiple electrographic ictal seizures with postictal slowing.   . EKG lead was unremarkable.  Impression:  This EEG is abnormal due to rhythmic polyspikes that build up to form multiple electrographic seizures with post-ictal slowing.

## 2018-07-12 NOTE — H&P (Signed)
Date: 07/12/2018               Patient Name:  Caroline Blake MRN: [id number]  DOB: January 10, 1946 Age / Sex: 72 y.o., female   PCP: Drake Leach, MD         Medical Service: Internal Medicine Teaching Service         Attending Physician: Dr. Evette Doffing    First Contact: Dr. Eileen Stanford  Pager: [phone]  Second Contact: Dr. Shan Levans Pager: [phone]       After Hours (After 5p/  First Contact Pager: [phone]  weekends / holidays): Second Contact Pager: [phone]   Chief Complaint: AMS  History of Present Illness:   *History was obtained from patient's husband Caroline Blake), son Caroline Blake) and chart review*  Caroline Blake is a 72 year old woman with medical history significant for focal epilepsy, multiple prior strokes and TIAs [7 in total] beginning in 2010; right MCA territory in 2011, right MCA territory 2013, left subcortical in 2014, left MCA territory in 08/2017 and recently on 05/2018 with residual weakness in all 4 extremities as well as right-sided tremors.  Per husband, he noticed patient was experiencing slurred speech and jitteriness at around 10:00 this morning.  He did not appreciate any new focal neurological weakness, urinary incontinence, bowel incontinence, jerky body like motion or tongue laceration.  He called EMS at around 1015 and patient was brought into the ED.  On arrival, code stroke was activated however CT head was negative per neurology, her presenting symptoms were more likely secondary to seizure with post ictal syndrome.  An EEG was obtained and preliminary results indicated seizure-like activities both hemispheres of the brain.  She received Ativan and neurology had plan to start her on Dilantin.  Per the husband, she has been gradually declining for the past 3 months and requiring more assistance than normal.  He helps to change her diapers.  He has noticed that recently she has been having increased wet diapers and also she has been refusing to take her medications.  He states that  she has poor oral intake and typically sleeps on a lot of water.  He notes that she has lost over 40 pounds in over a year.  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   Meds:  Current Meds  Medication Sig  . amiodarone (PACERONE) 200 MG tablet Take 200 mg by mouth daily.  Marland Kitchen amLODipine (NORVASC) 5 MG tablet Take 5 mg by mouth daily.  Marland Kitchen buPROPion (WELLBUTRIN SR) 150 MG 12 hr tablet Take 1 tablet by mouth daily.  . clopidogrel (PLAVIX) 75 MG tablet Take 1 tablet (75 mg total) by mouth daily.  Marland Kitchen donepezil (ARICEPT) 10 MG tablet Take 1 mg by mouth daily.   Marland Kitchen ezetimibe (ZETIA) 10 MG tablet Take 10 mg by mouth daily.  Marland Kitchen lamoTRIgine (LAMICTAL) 150 MG tablet Take 150 mg by mouth 2 (two) times daily.  Marland Kitchen levothyroxine (SYNTHROID, LEVOTHROID) 150 MCG tablet Take 150 mcg by mouth daily.  Marland Kitchen LORazepam (ATIVAN) 0.5 MG tablet Take 0.5 mg by mouth 2 (two) times daily.  . pravastatin (PRAVACHOL) 80 MG tablet Take 80 mg by mouth daily.  . sertraline (ZOLOFT) 100 MG tablet Take 200 mg by mouth daily.  . traZODone (DESYREL) 50 MG tablet Take 50 mg by mouth at bedtime.  Take an additional 0.5 Tablet as needed for insomnia     Allergies: Allergies as of 07/12/2018 - Review Complete 06/27/2018  Allergen Reaction Noted  . Atorvastatin Itching and Other (See Comments) 03/08/2013  . Carvedilol Itching 03/08/2013  . Cinoxacin Itching and Other (See Comments) 03/08/2013  . Etodolac Other (See Comments), Itching, and Hives 07/13/2011  . Metoclopramide Itching 03/08/2013  . Moxifloxacin Itching 03/08/2013  . Rivaroxaban Other (See Comments) 09/27/2016  . Rosuvastatin Itching and Other (See Comments) 03/08/2013  . Yellow dye Hives 05/28/2014  . Nitrofurantoin Other (See Comments) 05/28/2014  .  Paroxetine hcl Other (See Comments) and Itching 01/22/2012  . Rosuvastatin calcium Other (See Comments) 05/20/2015  . Fluoxetine Other (See Comments) 05/28/2014  . Levetiracetam Other (See Comments) 05/21/2018  . Sulfa antibiotics Hives and Itching 09/01/2017  . Yellow dyes (non-tartrazine) Other (See Comments) 09/01/2017  . Aspirin-dipyridamole er Itching 05/28/2014  . Ezetimibe-simvastatin Itching 05/28/2014  . Pitavastatin Itching 05/28/2014   Past Medical History:  Diagnosis Date  . Anemia   . Arthritis of ankle or foot, degenerative 04/30/2016  . Atrial fibrillation ([place]) 09/2016   with left atrial appendage, failed xarelto due to GIB, unable to have watchman due to nickel allergy and Coumadin never started  . AVM (arteriovenous malformation) of Blake with hemorrhage 2018   requiring transfusion --- last one was March 2018 - HgB on 10/22/2016 was 7.3 and on 10/23/2016 was 8.7  . Bacteriuria with pyuria 08/14/2015  . Benign hypertension 1995  . Cancer of kidney ([place])   . Cholelithiasis 2008  . Chronic conjunctivitis 07/04/2012  . Chronic kidney disease   . Clostridium difficile colitis 2017  . Cognitive impairment   . Blake polyp 2005   and 11/16/2016  . COPD (chronic obstructive pulmonary disease) ([place])    and currenlty smokes--- refuses to use HS home O2  . Coronary artery disease 2002  . CVA (cerebral vascular accident) ([place])   . Cystitis    INTESTINAL CHRONIC  . DDD (degenerative disc disease), cervical   . Depression   . Diabetes mellitus ([place]) 1999   HGBA1C on 09/09/2016 was 6.8  . Disease of thyroid gland   . History of kidney cancer    LEFT  . History of pancreatitis   . History of transfusion 10/2016  . Hyperlipidemia   . Hypertension   . Hypothyroidism   . Iron deficiency anemia 09/27/2016  . Left homonymous hemianopsia 07/04/2012  . Lisfranc dislocation, right, initial encounter 08/14/2017  . Memory impairment 09/09/2016  . Nuclear sclerosis of left eye  12/09/2016   added automatically from request for surgery [phone]  . OSA (obstructive sleep apnea)    o2 3L [place] PRN  . Pancreatitis 2008  . Posterior vitreous detachment 07/04/2012  . Postsurgical hypothyroidism    S/P partial thyroidectomy 1978, later went for additional parital thyroidectomy  . Renal cell adenocarcinoma, left ([place]) 2005   [place]  . Retinal drusen 07/04/2012  . Shortness of breath   . Sinus tarsitis of left foot 09/15/2016  . Snores 2005   does not use CPAP regularly  . Stroke [facility]) 2011   LLE sensory, 2012 x2 and 2013 x1; [place]  . TIA (transient ischemic attack) 10/21/2016  . Type 2 diabetes mellitus ([place])   . Type 2 diabetes mellitus with stage 3 chronic kidney disease, with long-term current use of insulin ([place])    on 10/23/2016 Scr was 1.89, GFR 26 and K+ 4.2    Social History:  -Ms. Tamala Julian  lives at home with her husband (a veteran) and family and also receives assistance from a caregiver.  She is weak at baseline and typically require assistance with a front wheel walker even to minimally ambulate. Most of the time, she is bedbound  The husband typically walks behind her to prevent her from falling. - She lives in [place], has 2 children and receives great care.  The daughter lives in [place]. 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 - She has a history of heavy cigarette use, about 2 packs/day for over 40 years. - The son denies any history of alcohol or illicit drug use.   Review of Systems: A complete ROS was negative except as per HPI.   Physical Exam: Blood pressure (!) 154/78, pulse 77, resp. rate 14, weight 58 kg, SpO2 98 %.  Physical Exam  Constitutional: She is well-developed, well-nourished, and in no distress. No distress.  HENT:  Head: Normocephalic and atraumatic.  -Dry mucous membrane  Eyes: Conjunctivae are normal. Right eye exhibits no discharge.  Neck: Neck supple.  Cardiovascular: Normal rate, regular rhythm  and normal heart sounds. Exam reveals no gallop and no friction rub.  No murmur heard. Pulmonary/Chest: Effort normal and breath sounds normal. No respiratory distress. She has no wheezes. She has no rales.  Abdominal: Soft. Bowel sounds are normal.  Neurological:  - Not alert or oriented to place, time, and person though she would withdraw to painful stimuli.  Able to track movement - Intermittent right upper extremity tremor - Sensory and motor strength difficult to obtain as patient remains altered.  Skin: Skin is dry. She is not diaphoretic.  -Decrease in turgor    EKG: personally reviewed my interpretation is sinus rhythm  CXR: personally reviewed my interpretation is   Assessment & Plan by Problem: Active Problems:   Status epilepticus [facility])  Ms. Croson is a 72 year old woman with medical history consistent for prior ischemic strokes (7 in total) with residual weakness in all 4 extremities as well as intermittent upper extremity tremors, TIAs, focal epilepsy and failure to thrive presented with altered mental status.  Hypernatremia: sNa of 163<<161. This is most likely secondary to chronic dehydration. Free water deficit of 4.3L. Correction of 0.11meq/hr.  - Goal of correction 151 @11 :12am on 07/13/2018  Seizure disorder Status epilepticus Recently diagnosed with a seizure disorder on after she presented to [facility] with lethargy and altered mental status.  An EEG performed revealed epileptiform activity with postictal slowing.  Was treated with Ativan and fosphenytoin load.  After discharge, son reports that she spent couple weeks at a rehab facility and was subsequently discharged home safely.  Today, she presents with altered mental status, slurred speech and dysarthria. - Stat EEG revealed seizure-like activities per neurology. - Status post Ativan 0.5 mg x 1 - Currently on fosphenytoin - Neurology plans to obtain overnight EEG - Patient is currently DNR and per  discussion with neurology and primary team, intubation would not be pursued as treatment for status epilepticus typically requires heavy sedation and burst suppression.  On further discussion with the family, a decision was made to transition to comfort care.  Hx of prior ischemic strokes: Ms. Dungee has had multiple ischemic strokes since 2010 including  right MCA territory in 2011, right MCA territory 2013, left subcortical in 2014, left MCA territory in 08/2017 and recently on 05/2018 with residual weakness in all 4 extremities as well as right-sided tremors.  CT head reviewed no acute abnormality. Per neurology, stroke is less likely.  - Resume dual antiplatelet with aspirin and Plavix  Paroxysmal atrial fibrillation: Previously on Xarelto however this was discontinued due to chronic GI bleed. - Currently in sinus rhythm - Rhythm control with amiodarone 200 mg daily.  Resume once stable  Acute on Chronic kidney disease III/IV:  Hypertension: BP with range 128-154/78-80.  Takes amlodipine 5 mg daily at home -Continue to monitor  Type 2 diabetes mellitus: Takes insulin 75/25 3 units BID at home  Vascular dementia: Follows up with Dr. Ernest Haber. -Will resume Aricept once stable and able to swallow  Hypothyroidism:  - Resume Synthroid 150 mg once stable  Hyperlipidemia: -Resume Zetia 10 mg daily and pravastatin 80 mg daily once stable  FEN: Replace electrolytes as needed, IV fluids with D5 at 100 mL/hour VTE prophylaxis: Subcutaneous heparin CODE STATUS: DNR/transitioned to comfort care  Dispo: Admit patient to Inpatient with expected length of stay greater than 2 midnights.  Signed: Jean Rosenthal, MD 07/12/2018, 1:27 PM  Pager: [phone] IMTS PGY-1

## 2018-07-12 NOTE — Progress Notes (Signed)
EEG complete - results pending 

## 2018-07-12 NOTE — Consult Note (Addendum)
Neurology Consultation  Reason for Consult: Code stroke Referring Physician: CURATOLO  CC: Aphasia  History is obtained from: Chart and EMS  HPI: Caroline Blake is a 72 y.o. female with significant past medical history however pertaining to stroke she has diabetes, TIA, stroke, hypertension, hyperlipidemia, diabetes, CVA, recently had a left atrial appendage closure with Lariat that showed no leakage on TEE back in 02/2018, seizure history unclear but on Lamictal for concern for seizures.    Per EMS the last seen normal was approximately 2000 hrs. on 07/11/2018.  Noticed this morning to have inability to speak, right facial droop, left arm weakness and leg weakness.   Patient does have A. fib which was diagnosed in 09/2016.  She was put on Xarelto however she had a GI bleed at that time and was at that point placed on dual antiplatelet of aspirin and Plavix.  Patient also has a history of seizures diagnosed in 09/2012 and was on Lamictal 100 mg twice daily since then.  In addition she has a history of vascular dementia and is followed by Dr. Ernest Haber along with being on Aricept.  Back in January 2019 she was seen in the hospital and admitted for slurred speech and gibberish speech.  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  TPA was given.  CTA of head and neck showed left M1 distal chronic occlusion, left M2 collateral flow, right P2 to stenosis.  EF at that time was 55-60%.  LDL was 101 and A1c was 6.5.  MRI did show multifocal infarcts involving the left cerebellum, left MCA, or right punctate MCA consistent with cardioembolic infarcts due to A. Fib.  Per son at that time she was having cognitive issues, she was bradyphrenic, and she had slurred speech.  According to the patient's son who arrived later, she has been getting more and more weak and lethargic over the past few months.  Other than the strokes documented in our EMR, she had another stroke in October and was  seen at [facility].  Upon arrival to the ED patient was very lethargic along with what appeared to be encephalopathic looking around but not following commands.  Patient appeared to have increased tone on the left arm.  Head CT was obtained and did not show any acute infarct.  After head CT she was brought to a room in the emergency department.  At that time it was noted that she was talking however she was perseverating and if not mumbling and could not hold a conversation.  It was also noted that she had rhythmic twitching of her right arm and leg which was concerning for possible simple complex seizure.  At that time a stat EEG was obtained.  Stat EEG showed rhythmic polyspikes epileptiform multiple electrographic seizures with postictal slowing.  She was given 0.5 mg of Ativan but continued to have those letter graphic abnormalities.  A fosphenytoin load was ordered.  According to the son, she is a DNR and would not want to be intubated even for burst suppression given her ongoing progressive decline in her mentation after multiple strokes and possible mixed/vascular dementia.  Her son at this point she is bedbound if he were not there, she needs full care of ADLs.  LKW: 2000 hrs. on 07/11/2018 tpa given?: no, out of window Premorbid modified Rankin scale (mRS):  5 NIH stroke scale of 22  last NIH stroke scale during outpatient visit was 5.  ROS:  Unable  to obtain due to altered mental status.   Past Medical History:  Diagnosis Date  . Anemia   . Arthritis of ankle or foot, degenerative 04/30/2016  . Atrial fibrillation ([place]) 09/2016   with left atrial appendage, failed xarelto due to GIB, unable to have watchman due to nickel allergy and Coumadin never started  . AVM (arteriovenous malformation) of colon with hemorrhage 2018   requiring transfusion --- last one was March 2018 - HgB on 10/22/2016 was 7.3 and on 10/23/2016 was 8.7  . Bacteriuria with pyuria 08/14/2015  . Benign hypertension  1995  . Cancer of kidney ([place])   . Cholelithiasis 2008  . Chronic conjunctivitis 07/04/2012  . Chronic kidney disease   . Clostridium difficile colitis 2017  . Cognitive impairment   . Colon polyp 2005   and 11/16/2016  . COPD (chronic obstructive pulmonary disease) ([place])    and currenlty smokes--- refuses to use HS home O2  . Coronary artery disease 2002  . CVA (cerebral vascular accident) ([place])   . Cystitis    INTESTINAL CHRONIC  . DDD (degenerative disc disease), cervical   . Depression   . Diabetes mellitus ([place]) 1999   HGBA1C on 09/09/2016 was 6.8  . Disease of thyroid gland   . History of kidney cancer    LEFT  . History of pancreatitis   . History of transfusion 10/2016  . Hyperlipidemia   . Hypertension   . Hypothyroidism   . Iron deficiency anemia 09/27/2016  . Left homonymous hemianopsia 07/04/2012  . Lisfranc dislocation, right, initial encounter 08/14/2017  . Memory impairment 09/09/2016  . Nuclear sclerosis of left eye 12/09/2016   added automatically from request for surgery [id number]  . OSA (obstructive sleep apnea)    o2 3L [place] PRN  . Pancreatitis 2008  . Posterior vitreous detachment 07/04/2012  . Postsurgical hypothyroidism    S/P partial thyroidectomy 1978, later went for additional parital thyroidectomy  . Renal cell adenocarcinoma, left ([place]) 2005   [place]  . Retinal drusen 07/04/2012  . Shortness of breath   . Sinus tarsitis of left foot 09/15/2016  . Snores 2005   does not use CPAP regularly  . Stroke [facility]) 2011   LLE sensory, 2012 x2 and 2013 x1; [place]  . TIA (transient ischemic attack) 10/21/2016  . Type 2 diabetes mellitus ([place])   . Type 2 diabetes mellitus with stage 3 chronic kidney disease, with long-term current use of insulin ([place])    on 10/23/2016 Scr was 1.89, GFR 26 and K+ 4.2    Family History  Problem Relation Age of Onset  . Hyperlipidemia Mother   . Lung cancer Mother   . Aneurysm Mother        abdominal aortic  . Anuerysm Father    . Heart disease Father   . Hyperlipidemia Father   . Stroke Father   . Tremor Father        familial essential  . Hypertension Sister   . Cancer Daughter        Peritoneal carcinomatosis, surviving  . Ovarian cancer Daughter   . Aneurysm Brother        abdominal aortic  . Anesthesia problems Neg Hx   . Arthritis Neg Hx   . Asthma Neg Hx   . Cerebral palsy Neg Hx   . Clotting disorder Neg Hx   . Deep vein thrombosis Neg Hx   . Club foot Neg Hx   . Collagen disease Neg Hx   .  Diabetes Neg Hx   . Gait disorder Neg Hx   . Gout Neg Hx   . Hip dysplasia Neg Hx   . Hip fracture Neg Hx   . Hypermobility Neg Hx   . Osteoporosis Neg Hx   . Pulmonary embolism Neg Hx   . Scoliosis Neg Hx   . Spina bifida Neg Hx   . Thyroid disease Neg Hx   . Vasculitis Neg Hx   . Rheumatologic disease Neg Hx    Social History:   reports that she quit smoking about 10 months ago. Her smoking use included cigarettes. She has a 50.00 pack-year smoking history. She has never used smokeless tobacco. She reports that she has current or past drug history. She reports that she does not drink alcohol.  Medications No current facility-administered medications for this encounter.   Current Outpatient Medications:  .  amiodarone (PACERONE) 200 MG tablet, Take 200 mg by mouth daily., Disp: , Rfl:  .  amLODipine (NORVASC) 5 MG tablet, Take 5 mg by mouth daily., Disp: , Rfl:  .  aspirin EC 81 MG tablet, Take 1 tablet (81 mg total) by mouth daily., Disp: 30 tablet, Rfl: 0 .  buPROPion (WELLBUTRIN SR) 150 MG 12 hr tablet, Take 1 tablet by mouth daily., Disp: , Rfl:  .  Calcium Carb-Cholecalciferol (OYSTERCAL-D) 500-400 MG-UNIT TABS, Take 1 tablet by mouth daily., Disp: , Rfl:  .  clopidogrel (PLAVIX) 75 MG tablet, Take 1 tablet (75 mg total) by mouth daily., Disp: 30 tablet, Rfl: 3 .  donepezil (ARICEPT) 10 MG tablet, Take 1 mg by mouth daily. , Disp: , Rfl: 0 .  ezetimibe (ZETIA) 10 MG tablet, Take 10 mg by  mouth daily., Disp: , Rfl:  .  ferrous sulfate 325 (65 FE) MG tablet, Take 1 tablet by mouth daily., Disp: , Rfl:  .  Insulin Lispro Prot & Lispro (HUMALOG MIX 75/25 KWIKPEN) (75-25) 100 UNIT/ML Kwikpen, Inject 3 Units into the skin 2 (two) times daily., Disp: , Rfl:  .  lamoTRIgine (LAMICTAL) 150 MG tablet, Take 150 mg by mouth 2 (two) times daily., Disp: , Rfl:  .  levothyroxine (SYNTHROID, LEVOTHROID) 150 MCG tablet, Take 150 mcg by mouth daily., Disp: , Rfl: 1 .  LORazepam (ATIVAN) 0.5 MG tablet, Take 0.5 mg by mouth 2 (two) times daily., Disp: , Rfl:  .  nystatin (NYSTATIN) powder, Apply 1 g topically 2 (two) times daily., Disp: , Rfl:  .  pravastatin (PRAVACHOL) 80 MG tablet, Take 80 mg by mouth daily., Disp: , Rfl: 11 .  sertraline (ZOLOFT) 100 MG tablet, Take 200 mg by mouth daily., Disp: , Rfl: 3 .  traZODone (DESYREL) 50 MG tablet, Take 50 mg by mouth at bedtime. Take an additional 0.5 Tablet as needed for insomnia, Disp: , Rfl:  .  vitamin B-12 (CYANOCOBALAMIN) 500 MCG tablet, Take 500 mcg by mouth daily., Disp: , Rfl:  .  vitamin C (ASCORBIC ACID) 500 MG tablet, Take 500 mg by mouth daily., Disp: , Rfl:   Exam: Current vital signs: BP (!) 154/78   Pulse 77   Resp 14   Wt 58 kg   SpO2 98%   BMI 21.95 kg/m  Vital signs in last 24 hours: Pulse Rate:  [77] 77 (12/04 1300) Resp:  [14] 14 (12/04 1300) BP: (128-154)/(78-80) 154/78 (12/04 1300) SpO2:  [94 %-98 %] 98 % (12/04 1300) Weight:  [58 kg] 58 kg (12/04 1200) Physical Exam  Constitutional: Slightly cachectic Psych: Encephalopathic  Eyes: No scleral injection HENT: No OP obstrucion Head: Normocephalic.  Cardiovascular: Irregular regular Respiratory: Effort normal, non-labored breathing GI: Soft.  No distension. There is no tenderness.  Skin: WDI Neuro: Mental Status: Patient is awake, she looks very encephalopathic-looking around the room, unable to follow commands, patient only responds to noxious stimuli Cranial  Nerves: II: Visual Fields shows a right upper lateral quadrantanopsia.  III,IV, VI: EOMI without ptosis or diploplia. Pupils are equal, round, and reactive to light.   V: Winces to pain with noxious stimuli VII: Facial droop which is old VIII: Looks towards voice Motor: At present time she appears to have decreased tone throughout however she is having rhythmic jerking in her right arm greater than right leg. Sensory: Winces and moans to noxious stimuli in all 4 extremities Deep Tendon Reflexes: 2+ in the upper extremities 2+ at the knee jerk no ankle jerk Plantars: Downgoing bilaterally Cerebellar: Unable to obtain  Labs I have reviewed labs in epic and the results pertinent to this consultation are:  CBC    Component Value Date/Time   WBC 10.4 07/12/2018 1110   RBC 4.73 07/12/2018 1110   HGB 13.3 07/12/2018 1112   HGB 10.0 (L) 03/24/2018 0930   HCT 39.0 07/12/2018 1112   HCT 32.2 (L) 03/24/2018 0930   PLT 232 07/12/2018 1110   PLT 489 (H) 03/24/2018 0930   MCV 93.7 07/12/2018 1110   MCV 81 03/24/2018 0930   MCH 26.6 07/12/2018 1110   MCHC 28.4 (L) 07/12/2018 1110   RDW 20.0 (H) 07/12/2018 1110   RDW 16.2 (H) 03/24/2018 0930   LYMPHSABS 1.1 07/12/2018 1110   MONOABS 0.7 07/12/2018 1110   EOSABS 0.2 07/12/2018 1110   BASOSABS 0.1 07/12/2018 1110    CMP severe hypernatremia sodium 163.  Deranged renal function with creatinine 5.20.  BUN 55.  Hyperglycemia glucose 126.    Component Value Date/Time   NA 163 (HH) 07/12/2018 1112   NA 141 03/24/2018 0930   K 4.8 07/12/2018 1112   CL 135 (HH) 07/12/2018 1112   CO2 18 (L) 03/24/2018 0930   GLUCOSE 126 (H) 07/12/2018 1112   BUN 117 (H) 07/12/2018 1112   BUN 55 (H) 03/24/2018 0930   CREATININE 5.20 (H) 07/12/2018 1112   CALCIUM 8.6 (L) 03/24/2018 0930   PROT 6.6 03/24/2018 0930   ALBUMIN 3.9 03/24/2018 0930   AST 10 03/24/2018 0930   ALT 6 03/24/2018 0930   ALKPHOS 62 03/24/2018 0930   BILITOT <0.2 03/24/2018 0930    GFRNONAA 14 (L) 03/24/2018 0930   GFRAA 16 (L) 03/24/2018 0930   EEG with polyspikes epileptiform electrographic seizures multiple times during the recording.  Imaging I have reviewed the images obtained: CT-scan of the brain- no acute abnormality.  Severe chronic ischemic changes throughout the brain.  Aspect score of [place] PA-C [facility] [phone]  M-F  (9:00 am- 5:00 PM)  07/12/2018, 11:33 AM   Attending addendum I have seen and examined the patient is an acute code stroke. I agree with the history document above.  She has had a history of multiple strokes with gradual progressive decline in her ability to take care of herself and overall mentation and cognition. Last seen normal around 8 to 8:30 PM on 07/11/2018 and this morning found to be nonverbal/slurred speech more than usual. Brought in by EMS as an acute code stroke.  At the bridge, more encephalopathic looking than focally weak. Noncontrast CT of the head done and  reviewed by me personally showed no acute changes.  Multiple areas of chronic infarction including right MCA territory and left frontal territory were seen.  Chronic ischemia was also noted throughout the brainstem. Lab testing revealed severe hypernatremia and deranged renal function EEG done stat because of concern for seizures as she started exhibiting some right-sided twitching revealed multiple electrographic seizures with postictal slowing.  Electrographic status epilepticus. On examination, she is awake, alert, tracks the examiner, does not follow any commands.  Has equal pupils round reactive to light, has a right superior quadrantanopsia and does not blink to threat from there but otherwise has blink to threat on other quadrants in both eyes.  She is nonverbal.  Only exhibits grimacing to noxious stimulation in all 4.  Increased tone in the left upper extremity.  Flaccid right upper extremity with right arm and leg jerking at  times.  Assessment:  72 year old with multiple strokes in the past and progressively worsening cognitive abilities over the past few months to the point where she requires 24/7 assistance at home, presenting for evaluation of confusion and slurred speech worse than baseline and noted to have right-sided weakness by EMS along with some a aphasia at that time. NIH stroke scale on initial examination 19. Labs reveal severely elevated sodium and deranged renal function. Some rhythmic right sided jerking noted for which a stat EEG was done that showed electrographic status epilepticus. Differential at this time are electrographic status epilepticus versus new stroke with the status epilepticus/seizures being more likely given the metabolic derangements  Impression: Status epilepticus Seizures -likely provoked in the setting of severe hypernatremia Severe hypernatremia Evaluate for new stroke Dementia Atrial fibrillation  Recommendations: Ativan 0.5.  X1 stat Patient's family wishes not to pursue intubation. Fosphenytoin 20 mg/kg's phenytoin equivalent stat LTM EEG We will continue to monitor She will need an MRI at some point Further therapy will be guided based on the EEG and clinical response. Patient son is aware that she has been deteriorating over the past few months and if not intubated for seizure control with heavy sedation/burst suppression, might continue to seize and this might be fatal. Management of hyper natremia per primary team We will continue to follow the patient I have discussed my plan with the primary team  -- Amie Portland, MD [facility] Pager: [phone] If 7pm to 7am, please call on call as listed on AMION.   CRITICAL CARE ATTESTATION Performed by: Amie Portland, MD Total critical care time: 60 minutes Critical care time was exclusive of separately billable procedures and treating other patients and/or supervising  APPs/Residents/Students Critical care was necessary to treat or prevent imminent or life-threatening deterioration due to seizure, status epilepticus, stroke This patient is critically ill and at significant risk for neurological worsening and/or death and care requires constant monitoring. Critical care was time spent personally by me on the following activities: development of treatment plan with patient and/or surrogate as well as nursing, discussions with consultants, evaluation of patient's response to treatment, examination of patient, obtaining history from patient or surrogate, ordering and performing treatments and interventions, ordering and review of laboratory studies, ordering and review of radiographic studies, pulse oximetry, re-evaluation of patient's condition, participation in multidisciplinary rounds and medical decision making of high complexity in the care of this patient.    Addendum LTM running.  Continuous electro graphic seizures in spite of being on fosphenytoin. Talked to the family (husband Shanon Brow and son and Jonni Sanger)- explained clinical exam and electrographic findings. Family has already  made the patient DNR but said that if treating the seizures would require intubation, they would not like to pursue this further as she has been deteriorating rapidly and would not want this quality of life. They would only like for her to be comfortable and have had this discussion amongst themselves in the past. They were scheduled to meet with Palliative Medicine as outpatient later this week. They would like to speak to palliative care now if possible-for pursuing comfort measures. I have relayed this plan to the primary team resident over the phone. I will discontinue the LTM EEG. Comfort order set will be initiated by the primary team and palliative consult will be obtained. Neurology will be available as needed.  Please call with questions.  -- Amie Portland, MD [facility] Pager: [phone] If 7pm to 7am, please call on call as listed on AMION.

## 2018-07-12 NOTE — Progress Notes (Deleted)
Date: 07/12/2018               Patient Name:  Caroline Blake MRN: [id number]  DOB: 03-Sep-1945 Age / Sex: 72 y.o., female   PCP: Drake Leach, MD         Medical Service: Internal Medicine Teaching Service         Attending Physician: Dr. Evette Doffing    First Contact: Dr. Eileen Stanford  Pager: [phone]  Second Contact: Dr. Shan Levans Pager: [phone]       After Hours (After 5p/  First Contact Pager: [phone]  weekends / holidays): Second Contact Pager: [phone]   Chief Complaint: AMS  History of Present Illness:   *History was obtained from patient's husband Shanon Brow), son Jonni Sanger) and chart review*  Ms. Gittings is a 72 year old woman with medical history significant for focal epilepsy, multiple prior strokes and TIAs [7 in total] beginning in 2010; right MCA territory in 2011, right MCA territory 2013, left subcortical in 2014, left MCA territory in 08/2017 and recently on 05/2018 with residual weakness in all 4 extremities as well as right-sided tremors.  Per husband, he noticed patient was experiencing slurred speech and jitteriness at around 10:00 this morning.  He did not appreciate any new focal neurological weakness, urinary incontinence, bowel incontinence, jerky body like motion or tongue laceration.  He called EMS at around 1015 and patient was brought into the ED.  On arrival, code stroke was activated however CT head was negative per neurology, her presenting symptoms were more likely secondary to seizure with post ictal syndrome.  An EEG was obtained and preliminary results indicated seizure-like activities both hemispheres of the brain.  She received Ativan and neurology had plan to start her on Dilantin.  Per the husband, she has been gradually declining for the past 3 months and requiring more assistance than normal.  He helps to change her diapers.  He has noticed that recently she has been having increased wet diapers and also she has been refusing to take her medications.  He states that  she has poor oral intake and typically sleeps on a lot of water.  He notes that she has lost over 40 pounds in over a year.  Ms. 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   Meds:  Current Meds  Medication Sig  . amiodarone (PACERONE) 200 MG tablet Take 200 mg by mouth daily.  Marland Kitchen amLODipine (NORVASC) 5 MG tablet Take 5 mg by mouth daily.  Marland Kitchen buPROPion (WELLBUTRIN SR) 150 MG 12 hr tablet Take 1 tablet by mouth daily.  . clopidogrel (PLAVIX) 75 MG tablet Take 1 tablet (75 mg total) by mouth daily.  Marland Kitchen donepezil (ARICEPT) 10 MG tablet Take 1 mg by mouth daily.   Marland Kitchen ezetimibe (ZETIA) 10 MG tablet Take 10 mg by mouth daily.  Marland Kitchen lamoTRIgine (LAMICTAL) 150 MG tablet Take 150 mg by mouth 2 (two) times daily.  Marland Kitchen levothyroxine (SYNTHROID, LEVOTHROID) 150 MCG tablet Take 150 mcg by mouth daily.  Marland Kitchen LORazepam (ATIVAN) 0.5 MG tablet Take 0.5 mg by mouth 2 (two) times daily.  . pravastatin (PRAVACHOL) 80 MG tablet Take 80 mg by mouth daily.  . sertraline (ZOLOFT) 100 MG tablet Take 200 mg by mouth daily.  . traZODone (DESYREL) 50 MG tablet Take 50 mg by mouth at bedtime.  Take an additional 0.5 Tablet as needed for insomnia     Allergies: Allergies as of 07/12/2018 - Review Complete 06/27/2018  Allergen Reaction Noted  . Atorvastatin Itching and Other (See Comments) 03/08/2013  . Carvedilol Itching 03/08/2013  . Cinoxacin Itching and Other (See Comments) 03/08/2013  . Etodolac Other (See Comments), Itching, and Hives 07/13/2011  . Metoclopramide Itching 03/08/2013  . Moxifloxacin Itching 03/08/2013  . Rivaroxaban Other (See Comments) 09/27/2016  . Rosuvastatin Itching and Other (See Comments) 03/08/2013  . Yellow dye Hives 05/28/2014  . Nitrofurantoin Other (See Comments) 05/28/2014  .  Paroxetine hcl Other (See Comments) and Itching 01/22/2012  . Rosuvastatin calcium Other (See Comments) 05/20/2015  . Fluoxetine Other (See Comments) 05/28/2014  . Levetiracetam Other (See Comments) 05/21/2018  . Sulfa antibiotics Hives and Itching 09/01/2017  . Yellow dyes (non-tartrazine) Other (See Comments) 09/01/2017  . Aspirin-dipyridamole er Itching 05/28/2014  . Ezetimibe-simvastatin Itching 05/28/2014  . Pitavastatin Itching 05/28/2014   Past Medical History:  Diagnosis Date  . Anemia   . Arthritis of ankle or foot, degenerative 04/30/2016  . Atrial fibrillation ([place]) 09/2016   with left atrial appendage, failed xarelto due to GIB, unable to have watchman due to nickel allergy and Coumadin never started  . AVM (arteriovenous malformation) of colon with hemorrhage 2018   requiring transfusion --- last one was March 2018 - HgB on 10/22/2016 was 7.3 and on 10/23/2016 was 8.7  . Bacteriuria with pyuria 08/14/2015  . Benign hypertension 1995  . Cancer of kidney ([place])   . Cholelithiasis 2008  . Chronic conjunctivitis 07/04/2012  . Chronic kidney disease   . Clostridium difficile colitis 2017  . Cognitive impairment   . Colon polyp 2005   and 11/16/2016  . COPD (chronic obstructive pulmonary disease) ([place])    and currenlty smokes--- refuses to use HS home O2  . Coronary artery disease 2002  . CVA (cerebral vascular accident) ([place])   . Cystitis    INTESTINAL CHRONIC  . DDD (degenerative disc disease), cervical   . Depression   . Diabetes mellitus ([place]) 1999   HGBA1C on 09/09/2016 was 6.8  . Disease of thyroid gland   . History of kidney cancer    LEFT  . History of pancreatitis   . History of transfusion 10/2016  . Hyperlipidemia   . Hypertension   . Hypothyroidism   . Iron deficiency anemia 09/27/2016  . Left homonymous hemianopsia 07/04/2012  . Lisfranc dislocation, right, initial encounter 08/14/2017  . Memory impairment 09/09/2016  . Nuclear sclerosis of left eye  12/09/2016   added automatically from request for surgery [phone]  . OSA (obstructive sleep apnea)    o2 3L [place] PRN  . Pancreatitis 2008  . Posterior vitreous detachment 07/04/2012  . Postsurgical hypothyroidism    S/P partial thyroidectomy 1978, later went for additional parital thyroidectomy  . Renal cell adenocarcinoma, left ([place]) 2005   [place]  . Retinal drusen 07/04/2012  . Shortness of breath   . Sinus tarsitis of left foot 09/15/2016  . Snores 2005   does not use CPAP regularly  . Stroke [facility]) 2011   LLE sensory, 2012 x2 and 2013 x1; [place]  . TIA (transient ischemic attack) 10/21/2016  . Type 2 diabetes mellitus ([place])   . Type 2 diabetes mellitus with stage 3 chronic kidney disease, with long-term current use of insulin ([place])    on 10/23/2016 Scr was 1.89, GFR 26 and K+ 4.2    Social History:  -Ms. Tamala Julian  lives at home with her husband (a veteran) and family and also receives assistance from a caregiver.  She is weak at baseline and typically require assistance with a front wheel walker even to minimally ambulate. Most of the time, she is bedbound  The husband typically walks behind her to prevent her from falling. - She lives in [place], has 2 children and receives great care.  The daughter lives in [place]. 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 - She has a history of heavy cigarette use, about 2 packs/day for over 40 years. - The son denies any history of alcohol or illicit drug use.   Review of Systems: A complete ROS was negative except as per HPI.   Physical Exam: Blood pressure (!) 154/78, pulse 77, resp. rate 14, weight 58 kg, SpO2 98 %.  Physical Exam  Constitutional: She is well-developed, well-nourished, and in no distress. No distress.  HENT:  Head: Normocephalic and atraumatic.  -Dry mucous membrane  Eyes: Conjunctivae are normal. Right eye exhibits no discharge.  Neck: Neck supple.  Cardiovascular: Normal rate, regular rhythm  and normal heart sounds. Exam reveals no gallop and no friction rub.  No murmur heard. Pulmonary/Chest: Effort normal and breath sounds normal. No respiratory distress. She has no wheezes. She has no rales.  Abdominal: Soft. Bowel sounds are normal.  Neurological:  - Not alert or oriented to place, time, and person though she would withdraw to painful stimuli.  Able to track movement - Intermittent right upper extremity tremor - Sensory and motor strength difficult to obtain as patient remains altered.  Skin: Skin is dry. She is not diaphoretic.  -Decrease in turgor    EKG: personally reviewed my interpretation is sinus rhythm  CXR: personally reviewed my interpretation is   Assessment & Plan by Problem: Active Problems:   Status epilepticus [facility])  Ms. Sindoni is a 72 year old woman with medical history consistent for prior ischemic strokes (7 in total) with residual weakness in all 4 extremities as well as intermittent upper extremity tremors, TIAs, focal epilepsy and failure to thrive presented with altered mental status.  Hypernatremia: sNa of 163<<161. This is most likely secondary to chronic dehydration. Free water deficit of 4.3L. Correction of 0.36meq/hr.  - Goal of correction 151 @11 :12am on 07/13/2018  Seizure disorder Status epilepticus Recently diagnosed with a seizure disorder on after she presented to [facility] with lethargy and altered mental status.  An EEG performed revealed epileptiform activity with postictal slowing.  Was treated with Ativan and fosphenytoin load.  After discharge, son reports that she spent couple weeks at a rehab facility and was subsequently discharged home safely.  Today, she presents with altered mental status, slurred speech and dysarthria. - Stat EEG revealed seizure-like activities per neurology. - Status post Ativan 0.5 mg x 1 - Currently on fosphenytoin - Neurology plans to obtain overnight EEG - Patient is currently DNR and per  discussion with neurology and primary team, intubation would not be pursued as treatment for status epilepticus typically requires heavy sedation and burst suppression.  On further discussion with the family, a decision was made to transition to comfort care.  Hx of prior ischemic strokes: Ms. Seay has had multiple ischemic strokes since 2010 including  right MCA territory in 2011, right MCA territory 2013, left subcortical in 2014, left MCA territory in 08/2017 and recently on 05/2018 with residual weakness in all 4 extremities as well as right-sided tremors.  CT head reviewed no acute abnormality. Per neurology, stroke is less likely.  - Resume dual antiplatelet with aspirin and Plavix  Paroxysmal atrial fibrillation: Previously on Xarelto however this was discontinued due to chronic GI bleed. - Currently in sinus rhythm - Rhythm control with amiodarone 200 mg daily.  Resume once stable  Acute on Chronic kidney disease III/IV:  Hypertension: BP with range 128-154/78-80.  Takes amlodipine 5 mg daily at home -Continue to monitor  Type 2 diabetes mellitus: Takes insulin 75/25 3 units BID at home  Vascular dementia: Follows up with Dr. Ernest Haber. -Will resume Aricept once stable and able to swallow  Hypothyroidism:  - Resume Synthroid 150 mg once stable  Hyperlipidemia: -Resume Zetia 10 mg daily and pravastatin 80 mg daily once stable  FEN: Replace electrolytes as needed, IV fluids with D5 at 100 mL/hour VTE prophylaxis: Subcutaneous heparin CODE STATUS: DNR/transitioned to comfort care  Dispo: Admit patient to Inpatient with expected length of stay greater than 2 midnights.  Signed: Jean Rosenthal, MD 07/12/2018, 1:27 PM  Pager: [phone] IMTS PGY-1

## 2018-07-12 NOTE — ED Provider Notes (Signed)
[facility] EMERGENCY DEPARTMENT Provider Note   CSN: [id number] Arrival date & time: 07/12/18  1103     History   Chief Complaint Chief Complaint  Patient presents with  . Code Stroke    HPI Caroline Blake is a 72 y.o. female.  Level 5 caveat due to altered mental status.  History is obtained by EMS and family.  Patient with mental status change about 9:51 AM this morning.  Patient with history of strokes, atrial fibrillation, left-sided deficits.  According to EMS patient with possibly some right-sided weakness, aphasia.  Code stroke upon arrival.  Neurology at the bedside upon arrival.  Patient went directly to the CT scan.  Airway intact.  The history is provided by the EMS personnel.  Neurologic Problem  This is a new problem. The current episode started 3 to 5 hours ago. The problem occurs constantly. The problem has not changed since onset.Nothing aggravates the symptoms. Nothing relieves the symptoms. She has tried nothing for the symptoms.    Past Medical History:  Diagnosis Date  . Anemia   . Arthritis of ankle or foot, degenerative 04/30/2016  . Atrial fibrillation ([place]) 09/2016   with left atrial appendage, failed xarelto due to GIB, unable to have watchman due to nickel allergy and Coumadin never started  . AVM (arteriovenous malformation) of colon with hemorrhage 2018   requiring transfusion --- last one was March 2018 - HgB on 10/22/2016 was 7.3 and on 10/23/2016 was 8.7  . Bacteriuria with pyuria 08/14/2015  . Benign hypertension 1995  . Cancer of kidney ([place])   . Cholelithiasis 2008  . Chronic conjunctivitis 07/04/2012  . Chronic kidney disease   . Clostridium difficile colitis 2017  . Cognitive impairment   . Colon polyp 2005   and 11/16/2016  . COPD (chronic obstructive pulmonary disease) ([place])    and currenlty smokes--- refuses to use HS home O2  . Coronary artery disease 2002  . CVA (cerebral vascular accident) ([place])   . Cystitis    INTESTINAL CHRONIC  . DDD (degenerative disc disease), cervical   . Depression   . Diabetes mellitus ([place]) 1999   HGBA1C on 09/09/2016 was 6.8  . Disease of thyroid gland   . History of kidney cancer    LEFT  . History of pancreatitis   . History of transfusion 10/2016  . Hyperlipidemia   . Hypertension   . Hypothyroidism   . Iron deficiency anemia 09/27/2016  . Left homonymous hemianopsia 07/04/2012  . Lisfranc dislocation, right, initial encounter 08/14/2017  . Memory impairment 09/09/2016  . Nuclear sclerosis of left eye 12/09/2016   added automatically from request for surgery [phone]  . OSA (obstructive sleep apnea)    o2 3L [place] PRN  . Pancreatitis 2008  . Posterior vitreous detachment 07/04/2012  . Postsurgical hypothyroidism    S/P partial thyroidectomy 1978, later went for additional parital thyroidectomy  . Renal cell adenocarcinoma, left ([place]) 2005   [place]  . Retinal drusen 07/04/2012  . Shortness of breath   . Sinus tarsitis of left foot 09/15/2016  . Snores 2005   does not use CPAP regularly  . Stroke [facility]) 2011   LLE sensory, 2012 x2 and 2013 x1; [place]  . TIA (transient ischemic attack) 10/21/2016  . Type 2 diabetes mellitus ([place])   . Type 2 diabetes mellitus with stage 3 chronic kidney disease, with long-term current use of insulin ([place])    on 10/23/2016 Scr was 1.89, GFR 26 and  K+ 4.2    Patient Active Problem List   Diagnosis Date Noted  . Status epilepticus ([place]) 07/12/2018  . Atrial flutter ([place]) 03/23/2018  . On amiodarone therapy 03/23/2018  . CAD in native artery 11/21/2017  . 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 Expressive aphasia 10/05/2017  . Solitary kidney, acquired 09/29/2017  . Metabolic acidosis, normal anion gap (NAG) 09/27/2017  . Hypernatremia 09/27/2017  . Hypokalemia 09/27/2017  . Hypocalcemia 09/27/2017  . Multiple closed fractures of foot with delayed healing, subsequent  encounter 09/27/2017  . Right clavicle fracture 09/27/2017  . UTI (urinary tract infection) 09/27/2017  . Aphasia 09/15/2017  . ARF (acute renal failure) ([place]) 09/14/2017  . Hypertensive heart disease 09/10/2017  . Hyperlipidemia 09/10/2017  . Depression 09/10/2017  . 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 Overactive bladder 09/10/2017  . Paroxysmal atrial fibrillation ([facility])   . History of CVA (cerebrovascular accident)   . History of seizure   . Vascular dementia with behavior disturbance ([place])   . Cerebral infarction ([place])   . Respiratory failure ([place])   . Goals of care, counseling/discussion   . Palliative care by specialist   . Stroke (cerebrum) ([place]) 09/01/2017  . Lisfranc dislocation, right, initial encounter 08/14/2017  . Sprain of ankle, right 01/28/2017  . Status post left cataract extraction 01/19/2017  . Anemia 10/22/2016  . Hemispheric carotid artery syndrome 10/22/2016  . Hyperglycemia due to type 2 diabetes mellitus ([place]) 10/22/2016  . Sigmoid diverticulosis 10/09/2016  . Acute kidney injury superimposed on CKD ([place]) 10/04/2016  . Iron deficiency anemia 09/27/2016  . Acute on chronic renal failure ([place]) 09/19/2016  . Bright red rectal bleeding 09/19/2016  . Nicotine abuse 09/19/2016  . Other enthesopathy of ankle and tarsus 09/15/2016  . Memory impairment 09/09/2016  . AVM (arteriovenous malformation) of colon with hemorrhage 08/09/2016  . History of kidney cancer 07/08/2016  . Arthritis of ankle or foot, degenerative, left 04/30/2016  . Anxiety and depression 02/24/2016  . [place] vulvovaginitis 09/30/2015  . Intertrigo 09/30/2015  . Overweight with body mass index (BMI) 25.0-29.9 09/30/2015  . Status post placement of implantable loop recorder 08/19/2015  . Localization-related (focal) (partial) symptomatic epilepsy and epileptic syndromes with complex partial seizures, not intractable, without status epilepticus ([place]) 05/20/2015  . Acute cystitis  05/28/2014  . Altered mental state 05/28/2014  . Cervical spondylosis without myelopathy 05/28/2014  . Chronic interstitial cystitis 05/28/2014  . Chronic obstructive pulmonary disease ([place]) 05/28/2014  . Chronic pancreatitis ([place]) 05/28/2014  . CVA, old, monoplegia upper limb 05/28/2014  . DDD (degenerative disc disease), lumbosacral 05/28/2014  . Degeneration of cervical intervertebral disc 05/28/2014  . Edema 05/28/2014  . Esophageal reflux 05/28/2014  . History of tobacco use 05/28/2014  . Hypoglycemia 05/28/2014  . Imbalance 05/28/2014  . Increased frequency of urination 05/28/2014  . Lower back pain 05/28/2014  . Lumbar strain 05/28/2014  . Major depressive disorder, recurrent episode, moderate ([place]) 05/28/2014  . Nausea 05/28/2014  . OSA (obstructive sleep apnea) 05/28/2014  . Osteoporosis 05/28/2014  . Pain in limb 05/28/2014  . Paronychia of left thumb 05/28/2014  . Proteinuria 05/28/2014  . Right carotid bruit 05/28/2014  . Late effects of CVA (cerebrovascular accident) 05/28/2014  . Swelling of lower limb 05/28/2014  . Thoracic and lumbosacral neuritis 05/28/2014  . Tobacco dependence 05/28/2014  . Tremor 05/28/2014  . Urethral stricture 05/28/2014  . Urinary urgency 05/28/2014  . PFO (patent foramen ovale) 01/20/2014  . Cortical cataract 07/04/2012  .  Left homonymous hemianopsia 07/04/2012  . Nuclear sclerosis 07/04/2012  . Posterior vitreous detachment 07/04/2012  . Retinal drusen 07/04/2012  . Benign essential hypertension 07/20/2011  . Family history of renal cell carcinoma 07/13/2011  . History of unilateral nephrectomy 07/13/2011  . Type 2 diabetes mellitus with stage 3 chronic kidney disease, with long-term current use of insulin ([place]) 08/09/1997    Past Surgical History:  Procedure Laterality Date  . CATARACT EXTRACTION    . CATARACT EXTRACTION W/ INTRAOCULAR LENS IMPLANT Left 01/19/2017   Procedure: PHACOEMULSIFICATION PC / IOL TOPICAL; Surgeon:  Sonia Side Burden, MD; Location: [name]; Service: Ophthalmology; Laterality: Left;  . CHOLECYSTECTOMY    . COLONOSCOPY Left 09/22/2016   Procedure: COLONOSCOPY, FLEXIBLE, PROXIMAL TO SPLENIC FLEXURE; DIAGNOSTIC, W/WO COLLECTION SPECIMEN BY BRUSH OR [place]; Surgeon: Ned Card, MD; Location: Endo Procedures [facility]; Service: Gastroenterology  . COLONOSCOPY  11/16/2016   Procedure: COLONOSCOPY, FLEXIBLE, PROXIMAL TO SPLENIC FLEXURE; DIAGNOSTIC, W/WO COLLECTION SPECIMEN BY BRUSH OR [place]; Surgeon: Ned Card, MD; Location: Endo Procedures [facility]; Service: Gastroenterology  . COLONOSCOPY W/ BIOPSIES  10/07/2016   Procedure: COLONOSCOPY, FLEXIBLE, PROXIMAL TO SPLENIC FLEXURE; WITH BIOPSY, SINGLE OR MULTIPLE; Surgeon: Bing Quarry, MD; Location: Endo Procedures [facility]; Service: Gastroenterology  . CORONARY STENT PLACEMENT  2002  . GALLBLADDER SURGERY  2008  . LOOP RECORDER IMPLANT  2018   medtronic reveal linq  . NEPHRECTOMY Left 2005   renal cancer   . THYROIDECTOMY, PARTIAL  1978   x2  . TOTAL ABDOMINAL HYSTERECTOMY W/ BILATERAL SALPINGOOPHORECTOMY  1984  . TUBAL LIGATION  1980  . UPPER GASTROINTESTINAL ENDOSCOPY Left 09/22/2016   Procedure: UGI ENDO, INCLUDE ESOPHAGUS, STOMACH, & DUODENUM &/OR JEJUNUM; DX W/WO COLLECTION SPECIMN, BY BRUSH OR [place]; Surgeon: Ned Card, MD; Location: Endo Procedures [place]; Service: Gastroenterology     OB History    Gravida  2   Para  2   Term  2   Preterm      AB      Living        SAB      TAB      Ectopic      Multiple      Live Births               Home Medications    Prior to Admission medications   Medication Sig Start Date End Date Taking? Authorizing Provider  amiodarone (PACERONE) 200 MG tablet Take 200 mg by mouth daily. 06/26/18  Yes [provider]  amLODipine (NORVASC) 5 MG tablet Take 5 mg by mouth daily.   Yes [provider]  buPROPion (WELLBUTRIN SR) 150 MG 12 hr  tablet Take 1 tablet by mouth daily. 06/26/18  Yes [provider]  clopidogrel (PLAVIX) 75 MG tablet Take 1 tablet (75 mg total) by mouth daily. 06/27/18  Yes Richardo Priest, MD  donepezil (ARICEPT) 10 MG tablet Take 1 mg by mouth daily.  05/27/17  Yes [provider]  ezetimibe (ZETIA) 10 MG tablet Take 10 mg by mouth daily. 04/02/13  Yes [provider]  lamoTRIgine (LAMICTAL) 150 MG tablet Take 150 mg by mouth 2 (two) times daily.   Yes [provider]  levothyroxine (SYNTHROID, LEVOTHROID) 150 MCG tablet Take 150 mcg by mouth daily. 08/12/17  Yes [provider]  LORazepam (ATIVAN) 0.5 MG tablet Take 0.5 mg by mouth 2 (two) times daily.   Yes [provider]  pravastatin (PRAVACHOL) 80 MG tablet Take  80 mg by mouth daily. 05/27/17  Yes [provider]  sertraline (ZOLOFT) 100 MG tablet Take 200 mg by mouth daily. 08/09/17  Yes [provider]  traZODone (DESYREL) 50 MG tablet Take 50 mg by mouth at bedtime. Take an additional 0.5 Tablet as needed for insomnia   Yes [provider]  aspirin EC 81 MG tablet Take 1 tablet (81 mg total) by mouth daily. 11/22/17   Richardo Priest, MD  Calcium Carb-Cholecalciferol (OYSTERCAL-D) 500-400 MG-UNIT TABS Take 1 tablet by mouth daily.    [provider]  ferrous sulfate 325 (65 FE) MG tablet Take 1 tablet by mouth daily. 05/01/18   [provider]  Insulin Lispro Prot & Lispro (HUMALOG MIX 75/25 KWIKPEN) (75-25) 100 UNIT/ML Kwikpen Inject 3 Units into the skin 2 (two) times daily.    [provider]  nystatin (NYSTATIN) powder Apply 1 g topically 2 (two) times daily.    [provider]  vitamin B-12 (CYANOCOBALAMIN) 500 MCG tablet Take 500 mcg by mouth daily.    [provider]  vitamin C (ASCORBIC ACID) 500 MG tablet Take 500 mg by mouth daily.    [provider]    Family History Family History  Problem Relation Age of Onset   . Hyperlipidemia Mother   . Lung cancer Mother   . Aneurysm Mother        abdominal aortic  . Anuerysm Father   . Heart disease Father   . Hyperlipidemia Father   . Stroke Father   . Tremor Father        familial essential  . Hypertension Sister   . Cancer Daughter        Peritoneal carcinomatosis, surviving  . Ovarian cancer Daughter   . Aneurysm Brother        abdominal aortic  . Anesthesia problems Neg Hx   . Arthritis Neg Hx   . Asthma Neg Hx   . Cerebral palsy Neg Hx   . Clotting disorder Neg Hx   . Deep vein thrombosis Neg Hx   . Club foot Neg Hx   . Collagen disease Neg Hx   . Diabetes Neg Hx   . Gait disorder Neg Hx   . Gout Neg Hx   . Hip dysplasia Neg Hx   . Hip fracture Neg Hx   . Hypermobility Neg Hx   . Osteoporosis Neg Hx   . Pulmonary embolism Neg Hx   . Scoliosis Neg Hx   . Spina bifida Neg Hx   . Thyroid disease Neg Hx   . Vasculitis Neg Hx   . Rheumatologic disease Neg Hx     Social History Social History   Tobacco Use  . Smoking status: Former Smoker    Packs/day: 1.00    Years: 50.00    Pack years: 50.00    Types: Cigarettes    Last attempt to quit: 09/01/2017    Years since quitting: 0.8  . Smokeless tobacco: Never Used  Substance Use Topics  . Alcohol use: No    Frequency: Never  . Drug use: Not Currently     Allergies   Atorvastatin; Carvedilol; Cinoxacin; Etodolac; Metoclopramide; Moxifloxacin; Rivaroxaban; Rosuvastatin; Yellow dye; Nitrofurantoin; Paroxetine hcl; Rosuvastatin calcium; Fluoxetine; Levetiracetam; Sulfa antibiotics; Yellow dyes (non-tartrazine); Aspirin-dipyridamole er; Ezetimibe-simvastatin; and Pitavastatin   Review of Systems Review of Systems  Unable to perform ROS: Acuity of condition     Physical Exam Updated Vital Signs  ED Triage Vitals [07/12/18 1200]  Enc Vitals Group     BP 128/80     Pulse Rate 77     Resp 14     Temp      Temp src      SpO2 94 %     Weight 127 lb 13.9 oz (58 kg)      Height      Head Circumference      Peak Flow      Pain Score      Pain Loc      Pain Edu?      Excl. in [place]?     Physical Exam  Constitutional: She appears distressed.  HENT:  Head: Normocephalic and atraumatic.  Mouth/Throat: No oropharyngeal exudate.  Eyes: Pupils are equal, round, and reactive to light. Conjunctivae are normal.  Neck: Normal range of motion. Neck supple.  Cardiovascular: Normal rate, regular rhythm, normal heart sounds and intact distal pulses.  No murmur heard. Pulmonary/Chest: Effort normal and breath sounds normal. No stridor. No respiratory distress.  Abdominal: Soft. She exhibits no distension.  Musculoskeletal: Normal range of motion. She exhibits no edema.  Neurological: She is disoriented.  Patient overall difficult to assess due to altered mental status, patient with left-sided preference, bilateral lower leg gross weakness, however does move all extremities, unable to follow commands, some right-sided facial droop but overall difficult to assess cranial nerves  Skin: Skin is warm. Capillary refill takes less than 2 seconds.     ED Treatments / Results  Labs (all labs ordered are listed, but only abnormal results are displayed) Labs Reviewed  CBC - Abnormal; Notable for the following components:      Result Value   MCHC 28.4 (*)    RDW 20.0 (*)    All other components within normal limits  DIFFERENTIAL - Abnormal; Notable for the following components:   Neutro Abs 8.4 (*)    All other components within normal limits  COMPREHENSIVE METABOLIC PANEL - Abnormal; Notable for the following components:   Sodium 161 (*)    Chloride >130 (*)    CO2 17 (*)    Glucose, Bld 131 (*)    BUN 120 (*)    Creatinine, Ser 5.08 (*)    Calcium 8.4 (*)    AST 55 (*)    ALT 85 (*)    GFR calc non Af Amer 8 (*)    GFR calc Af Amer 9 (*)    All other components within normal limits  CBG MONITORING, ED - Abnormal; Notable for the following components:    Glucose-Capillary 118 (*)    All other components within normal limits  I-STAT CHEM 8, ED - Abnormal; Notable for the following components:   Sodium 163 (*)    Chloride 135 (*)    BUN 117 (*)    Creatinine, Ser 5.20 (*)    Glucose, Bld 126 (*)    Calcium, Ion 1.06 (*)    TCO2 21 (*)    All other components within normal limits  URINE CULTURE  PROTIME-INR  APTT  URINALYSIS, ROUTINE W REFLEX MICROSCOPIC  NA AND K (SODIUM & POTASSIUM), RAND UR  CREATININE, URINE, RANDOM  BASIC METABOLIC PANEL  BASIC METABOLIC PANEL  BASIC METABOLIC PANEL  BASIC METABOLIC PANEL  I-STAT TROPONIN, ED    EKG None  Radiology Ct Head Code Stroke Wo Contrast  Result Date: 07/12/2018 CLINICAL DATA:  Code stroke. Focal neuro deficit greater than 6 hours EXAM: CT HEAD WITHOUT CONTRAST TECHNIQUE: Contiguous axial  images were obtained from the base of the skull through the vertex without intravenous contrast. COMPARISON:  CT head 05/21/2018 FINDINGS: Brain: Moderate atrophy. Severe chronic ischemic changes are present. Chronic right MCA infarct in the temporal and parietal lobe. Chronic left frontal infarct. Chronic subinsular infarct on the left. Chronic ischemic changes throughout the cerebral white matter bilaterally. Chronic ischemia in the pons. Chronic infarct in the cerebellum bilaterally. Negative for hemorrhage acute infarct or mass. Vascular: Negative for hyperdense vessel Skull: Negative Sinuses/Orbits: Negative Other: None ASPECTS ([place] Stroke Program Early CT Score) - Ganglionic level infarction (caudate, lentiform nuclei, internal capsule, insula, M1-M3 cortex): 7 - Supraganglionic infarction (M4-M6 cortex): 3 Total score (0-10 with 10 being normal): 10 IMPRESSION: 1. No acute abnormality 2. Severe chronic ischemic changes throughout the brain 3. ASPECTS is 10 4. These results were called by telephone at the time of interpretation on 07/12/2018 at 11:21 am to Dr. Rory Percy , who verbally acknowledged these  results. Electronically Signed   By: Franchot Gallo M.D.   On: 07/12/2018 11:22    Procedures .Critical Care Performed by: Lennice Sites, DO Authorized by: Lennice Sites, DO   Critical care provider statement:    Critical care time (minutes):  40   Critical care was necessary to treat or prevent imminent or life-threatening deterioration of the following conditions:  CNS failure or compromise   Critical care was time spent personally by me on the following activities:  Discussions with consultants, discussions with primary provider, evaluation of patient's response to treatment, examination of patient, obtaining history from patient or surrogate, ordering and performing treatments and interventions, ordering and review of laboratory studies, ordering and review of radiographic studies, pulse oximetry, re-evaluation of patient's condition and review of old charts   I assumed direction of critical care for this patient from another provider in my specialty: no     (including critical care time)  Medications Ordered in ED Medications  phenytoin (DILANTIN) injection 100 mg (has no administration in time range)  heparin injection 5,000 Units (has no administration in time range)  folic acid injection 1 mg (has no administration in time range)  thiamine (B-1) injection 100 mg (has no administration in time range)  dextrose 5 % solution (has no administration in time range)  LORazepam (ATIVAN) injection 0.5 mg (0.5 mg Intravenous Given 07/12/18 1129)  fosPHENYtoin (CEREBYX) 1,160 mg PE in sodium chloride 0.9 % 50 mL IVPB (0 mg PE/kg  58 kg Intravenous Stopped 07/12/18 1325)     Initial Impression / Assessment and Plan / ED Course  I have reviewed the triage vital signs and the nursing notes.  Pertinent labs & imaging results that were available during my care of the patient were reviewed by me and considered in my medical decision making (see chart for details).     LIZANIA BOUCHARD is a  72 year old female with history of atrial fibrillation, seizures, stroke who presents to the ED with altered mental status.  Patient code stroke upon arrival.  Normal vitals.  No fever.  Unable to obtain good exam secondary to altered mental status, aphasia.  Patient went directly to the CT scan with neurology due to concern for stroke.  No acute stroke was found on CT scan.  Concern for possible status epilepticus.  Patient set up on stat EEG.  Given Ativan.  Lab work was drawn.  EKG showed no signs of ischemic changes.  Troponin within normal limits.  Sodium was 163 but otherwise patient with creatinine  at baseline.  No other significant electrolyte abnormalities.  Patient found to be in status epilepticus on EEG.  Was started on IV Dilantin and fosphenytoin.  Patient was given thiamine, folic acid.  Contacted patient's family member who states that patient is DNR and would not want intubation or life support.  They were made aware of situation and patient to be admitted to stepdown unit for further care.  Admitted to internal medicine service.  Remained hemodynamically stable throughout my care.  Started to have some improvement following medication.  Patient with altered mental status due to status epilepticus, electrolyte abnormalities.  This chart was dictated using voice recognition software.  Despite best efforts to proofread,  errors can occur which can change the documentation meaning.   Final Clinical Impressions(s) / ED Diagnoses   Final diagnoses:  Aphasia  Altered mental status, unspecified altered mental status type  Hypernatremia  Status epilepticus [facility])    ED Discharge Orders    None       Lennice Sites, DO 07/12/18 1337

## 2018-07-12 NOTE — ED Triage Notes (Signed)
Pt brought in by for right sided facial droop and weakness. Pt has hx of stroke and TIA. Pt is aphasic.

## 2018-07-13 DIAGNOSIS — Z7189 Other specified counseling: Secondary | ICD-10-CM

## 2018-07-13 DIAGNOSIS — Z515 Encounter for palliative care: Secondary | ICD-10-CM

## 2018-07-13 DIAGNOSIS — Z886 Allergy status to analgesic agent status: Secondary | ICD-10-CM

## 2018-07-13 LAB — URINE CULTURE: Culture: NO GROWTH

## 2018-07-13 MED ORDER — ONDANSETRON 4 MG PO TBDP: 4.0000 mg | ORAL_TABLET | Freq: Four times a day (QID) | ORAL | 0 refills | Status: AC | PRN

## 2018-07-13 MED ORDER — GLYCOPYRROLATE 1 MG PO TABS: 1.0000 mg | ORAL_TABLET | ORAL | 0 refills | Status: AC | PRN

## 2018-07-13 MED ORDER — HALOPERIDOL 0.5 MG PO TABS: 0.5000 mg | ORAL_TABLET | ORAL | 0 refills | Status: AC | PRN

## 2018-07-13 MED ORDER — LORAZEPAM 2 MG/ML IJ SOLN: 1.0000 mg | INTRAMUSCULAR | 0 refills | Status: AC | PRN

## 2018-07-13 MED ORDER — HALOPERIDOL LACTATE 5 MG/ML IJ SOLN: 0.5000 mg | INTRAMUSCULAR | 2 refills | Status: AC | PRN

## 2018-07-13 MED ORDER — HALOPERIDOL LACTATE 2 MG/ML PO CONC: 0.5000 mg | ORAL | 0 refills | Status: AC | PRN

## 2018-07-13 MED ORDER — LORAZEPAM 1 MG PO TABS: 1.0000 mg | ORAL_TABLET | ORAL | 0 refills | Status: AC | PRN

## 2018-07-13 MED ORDER — GLYCOPYRROLATE 0.2 MG/ML IJ SOLN: 0.2000 mg | INTRAMUSCULAR | 0 refills | Status: AC | PRN

## 2018-07-13 MED ORDER — LORAZEPAM 2 MG/ML PO CONC: 1.0000 mg | ORAL | 0 refills | Status: AC | PRN

## 2018-07-13 MED ORDER — PHENYTOIN SODIUM 50 MG/ML IJ SOLN: 100.0000 mg | Freq: Three times a day (TID) | INTRAMUSCULAR | 0 refills | Status: AC

## 2018-07-13 MED ORDER — HYDROMORPHONE HCL 1 MG/ML IJ SOLN: 0.5000 mg | INTRAMUSCULAR | Status: DC | PRN

## 2018-07-13 MED ORDER — ONDANSETRON HCL 4 MG/2ML IJ SOLN: 4.0000 mg | Freq: Four times a day (QID) | INTRAMUSCULAR | 0 refills | Status: AC | PRN

## 2018-07-13 NOTE — Progress Notes (Addendum)
Discharge to: [facility] Anticipated discharge date: 07/13/18 Family notified: Yes, at bedside Transportation by: Corey Harold  Transport scheduled for 3:00 PM.  CSW signing off.  Laveda Abbe LCSW [phone]

## 2018-07-13 NOTE — Discharge Summary (Signed)
Name: Caroline Blake MRN: [id number] DOB: 10-18-1945 72 y.o. 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 -Hypertension -Type 2 diabetes mellitus -Vascular dementia -Hypothyroidism -Hyperlipidemia   Discharge Medications: Allergies as of 07/13/2018      Reactions   Atorvastatin Itching, Other (See Comments)   Myopathy also Myopathy   Carvedilol Itching   Cinoxacin Itching, Other (See Comments)   Hypotension also hypotension   Etodolac Other (See Comments), Itching, Hives   Metoclopramide Itching   Moxifloxacin Itching   Rivaroxaban Other (See Comments)   GI bleed GI bleed GI bleed   Rosuvastatin Itching, Other (See Comments)   Myalgias   Yellow Dye Hives   Nitrofurantoin Other (See Comments)   Yeast infection Yeast infection Yeast infection   Paroxetine Hcl Other (See Comments), Itching   Unknown reaction Unknown reaction   Rosuvastatin Calcium Other (See Comments)   Myalgias   Fluoxetine Other (See Comments)   Pancreatitis   Levetiracetam Other (See Comments)   Unknown    Other    Nickle   Sulfa Antibiotics Hives, Itching   Yellow Dyes (non-tartrazine) Other (See Comments)   unspecified   Aspirin-dipyridamole Er Itching   Ezetimibe-simvastatin Itching   Pitavastatin Itching      Medication List    STOP taking these medications   amiodarone 200 MG tablet Commonly known as:  PACERONE   amLODipine 5 MG tablet Commonly known as:  NORVASC   aspirin EC 81 MG tablet   buPROPion 150 MG 12 hr tablet Commonly known as:  WELLBUTRIN SR   clopidogrel 75 MG tablet Commonly known as:  PLAVIX   donepezil 10 MG tablet Commonly known as:  ARICEPT     ezetimibe 10 MG tablet Commonly known as:  ZETIA   HUMALOG MIX 75/25 KWIKPEN (75-25) 100 UNIT/ML Kwikpen Generic drug:  Insulin Lispro Prot & Lispro   levothyroxine 150 MCG tablet Commonly known as:  SYNTHROID, LEVOTHROID   MYRBETRIQ 50 MG Tb24 tablet Generic drug:  mirabegron ER   nystatin powder Generic drug:  nystatin   OYSTERCAL-D 500-400 MG-UNIT Tabs Generic drug:  Calcium Carb-Cholecalciferol   pravastatin 80 MG tablet Commonly known as:  PRAVACHOL   vitamin B-12 500 MCG tablet Commonly known as:  CYANOCOBALAMIN   vitamin C 500 MG tablet Commonly known as:  ASCORBIC ACID     TAKE these medications   glycopyrrolate 0.2 MG/ML injection Commonly known as:  ROBINUL Inject 1 mL (0.2 mg total) into the vein every 4 (four) hours as needed (excessive secretions).   glycopyrrolate 1 MG tablet Commonly known as:  ROBINUL Take 1 tablet (1 mg total) by mouth every 4 (four) hours as needed for up to 15 days (excessive secretions).   glycopyrrolate 0.2 MG/ML injection Commonly known as:  ROBINUL Inject 1 mL (0.2 mg total) into the skin every 4 (four) hours as needed for up to 15 days (excessive secretions).   haloperidol 0.5 MG tablet Commonly known as:  HALDOL Take 1 tablet (0.5 mg total) by mouth every 4 (four) hours as needed for up to 15 days for agitation (or delirium).   haloperidol 2 MG/ML solution Commonly known as:  HALDOL Place 0.3 mLs (0.6 mg total) under the tongue every 4 (four) hours as needed for up  to 15 days for agitation (or delirium).   haloperidol lactate 5 MG/ML injection Commonly known as:  HALDOL Inject 0.1 mLs (0.5 mg total) into the vein every 4 (four) hours as needed for up to 15 days (or delirium).   lamoTRIgine 150 MG tablet Commonly known as:  LAMICTAL Take 150 mg by mouth 2 (two) times daily.   LORazepam 0.5 MG tablet Commonly known as:  ATIVAN Take 0.5 mg by mouth 2 (two) times daily. What changed:  Another medication with the same  name was added. Make sure you understand how and when to take each.   LORazepam 1 MG tablet Commonly known as:  ATIVAN Take 1 tablet (1 mg total) by mouth every 4 (four) hours as needed for anxiety. What changed:  You were already taking a medication with the same name, and this prescription was added. Make sure you understand how and when to take each.   LORazepam 2 MG/ML concentrated solution Commonly known as:  ATIVAN Place 0.5 mLs (1 mg total) under the tongue every 4 (four) hours as needed for anxiety. What changed:  You were already taking a medication with the same name, and this prescription was added. Make sure you understand how and when to take each.   LORazepam 2 MG/ML injection Commonly known as:  ATIVAN Inject 0.5 mLs (1 mg total) into the vein every 4 (four) hours as needed for anxiety. What changed:  You were already taking a medication with the same name, and this prescription was added. Make sure you understand how and when to take each.   ondansetron 4 MG disintegrating tablet Commonly known as:  ZOFRAN-ODT Take 1 tablet (4 mg total) by mouth every 6 (six) hours as needed for nausea.   ondansetron 4 MG/2ML Soln injection Commonly known as:  ZOFRAN Inject 2 mLs (4 mg total) into the vein every 6 (six) hours as needed for nausea.   phenytoin 50 MG/ML injection Commonly known as:  DILANTIN Inject 2 mLs (100 mg total) into the vein every 8 (eight) hours.   sertraline 100 MG tablet Commonly known as:  ZOLOFT Take 200 mg by mouth daily.   traZODone 50 MG tablet Commonly known as:  DESYREL Take 50 mg by mouth at bedtime. Take an additional 0.5 Tablet as needed for insomnia       Disposition and follow-up:   Caroline Blake was discharged from [facility] in [place] condition.  At the hospital follow up visit please address:  1.  Seizure disorder, status epilepticus, comfort care  2.  Labs / imaging needed at time of follow-up: None   3.   Pending labs/ test needing follow-up: None   Hospital Course by problem list: Caroline Blake is a 72 year old woman with medical history significant for focal epilepsy, multiple prior strokes and TIAs [7 in total] beginning in 2010; right MCA territory in 2011, right MCA territory 2013, left subcortical in 2014, left MCA territory in 08/2017 and recently on 05/2018 with residual weakness in all 4 extremities as well as right-sided tremors.  Per husband, he noticed patient was experiencing slurred speech and jitteriness at around 10:00 this morning.  He did not appreciate any new focal neurological weakness, urinary incontinence, bowel incontinence, jerky body like motion or tongue laceration.  He called EMS at around 1015 and patient was brought into the ED.  On arrival, code stroke was activated however CT head was negative per neurology, her presenting symptoms were more likely secondary to  seizure with post ictal syndrome.  An EEG was obtained and preliminary results indicated seizure-like activities both hemispheres of the brain.  She received Ativan and neurology had plan to start her on Dilantin. Overnight EEG revealed rhythmic polyspikes indicating multiple seizures with post-ictal slowing. Per discussion with neurology and primary team, intubation would not be pursued as treatment for status epilepticus typically requires heavy sedation and burst suppression.  On further discussion with the family, a decision was made to transition to comfort care.  Per the husband, she has been gradually declining for the past 3 months and requiring more assistance than normal.  He helps to change her diapers.  He has noticed that recently she has been having increased wet diapers and also she has been refusing to take her medications.  He states that she has poor oral intake and typically sleeps on a lot of water.  He notes that she has lost over 40 pounds in over a year.  Discharge Vitals:   BP 138/70 (BP Location:  Left Arm)   Pulse 89   Temp 97.8 F (36.6 C) (Axillary)   Resp 20   Wt 58 kg   SpO2 96%   BMI 21.95 kg/m   Discharge Instructions: Discharge Instructions    Diet - low sodium heart healthy   Complete by:  As directed    Discharge instructions   Complete by:  As directed    Transfer to Hospice   Increase activity slowly   Complete by:  As directed       Signed: Jean Rosenthal, MD 07/13/2018, 12:17 PM   Pager: [phone] IMTS PGY-1

## 2018-07-13 NOTE — Progress Notes (Signed)
   Subjective: HD #1  Overnight: No acute events  Today, Caroline Blake was examined at bedside and was found comfortably lying in bed. There were noticeable intermittent mild right upper extremity tremors. There were no family members present on evaluation.   Objective:  Vital signs in last 24 hours: Vitals:   07/12/18 1530 07/12/18 1600 07/12/18 1801 07/12/18 1906  BP: 137/79 (!) 154/86 118/77 (!) 156/77  Pulse: 92 91 88 89  Resp: 17 14 15 20   Temp:   98.3 F (36.8 C) 98.1 F (36.7 C)  TempSrc:   Axillary Oral  SpO2: 97% 95% 94% 94%  Weight:       Const: Lying comfortably in bed Resp: Agonal breathing though not severe  Assessment/Plan:  Principal Problem:   End of life care Active Problems:   Cerebral infarction ([place])   Hypernatremia   Status epilepticus (HCC)  Caroline Blake is a 72 year old woman with medical history consistent for prior ischemic strokes (7 in total) with residual weakness in all 4 extremities as well as intermittent upper extremity tremors, TIAs, focal epilepsy and failure to thrive presented with altered mental status.  Seizure disorder Status epilepticus Comfort Care Overnight EEG revealed rhythmic polyspikes indicating multiple seizures with post-ictal slowing.  - Continue Ativan, Dilantin, morphine, Haldol, Robinul  - Neurology to follow - Palliative medicine to follow   Hypernatremia: Presented with sNa of 161 and repeat of 163. Will not pursue treatment as family has decided on comfort care.   Hx of prior ischemic strokes: Multiple ischemic strokes since 2010 including  right MCA territory in 2011, right MCA territory 2013, left subcortical in 2014, left MCA territory in 08/2017 and recently on 05/2018 with residual weakness in all 4 extremities as well as right-sided tremors.  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 Ernest Haber. -Hold Aricept once stable and able to swallow  Hypothyroidism: Hold Synthroid 150 mg once stable  Hyperlipidemia: hold Zetia 10 mg daily and pravastatin 80 mg daily once stable  FEN: Replace electrolytes as needed, IV fluids with D5 at 100 mL/hour VTE prophylaxis: Subcutaneous heparin CODE STATUS: DNR/transitioned to comfort care  Dispo: Admit patient to Inpatient with expected length of stay greater than 2 midnights.   Jean Rosenthal, MD 07/13/2018, 7:27 AM Pager: [phone] IMTS PGY-1

## 2018-07-13 NOTE — Progress Notes (Addendum)
Pt d/c to [facility]. Patient is comfortable with no new concerns. family at the bed side. Pt will be transported out of hospital by [facility], d/c instructions and report given to receiving  nurse Debb RN.   IV left in place for easy access by Hospice team.

## 2018-07-13 NOTE — Consult Note (Signed)
[facility]: [employer]  Met with family at bedside. Discussed the hospice philosophy and the plan for a full comfort approach at the [facility] in [facility]. They are in agreement with the comfort care. Spoke to Dr. Elinor Parkinson at [facility] and she does agree pt meets criteria. Pt will transfer by ambulance. Rexene Alberts CSW has arranged transport for 300pm.  Webb Silversmith RN [phone]

## 2018-07-13 NOTE — Consult Note (Signed)
Consultation Note Date: 07/13/2018   Patient Name: Caroline Blake  DOB: 1945-11-09  MRN: [id number]  Age / Sex: 72 y.o., female  PCP: Drake Leach, MD Referring Physician: Axel Filler, *  Reason for Consultation: Establishing goals of care, Hospice Evaluation and Terminal Care  HPI/Patient Profile: 72 y.o. female  with past medical history of DM, HTN, HLD, vascular dementia, a fib, focal epilepsy, multiple strokes and TIAs since 2010 with residual weakness in all 4 extremities as well as right-sided tremors admitted on 07/12/2018 with slurred speech and a facial droop. Code stroke was activated on arrival to ED; however, CT head was negative and her symptoms were attributed to seizure with post ictal syndrome. EEG revealed seizure activity. Decision was made with family to transition to comfort care. PMT consulted to assist with comfort care.  Clinical Assessment and Goals of Care: I have reviewed medical records including EPIC notes, labs and imaging, received report from RN, assessed the patient and then met with patient's sister and husband  to discuss diagnosis prognosis, GOC, EOL wishes, disposition and options.  Family familiar with PMT - met with them a few times earlier this year.   I reintroduced Palliative Medicine as specialized medical care for people living with serious illness. It focuses on providing relief from the symptoms and stress of a serious illness. The goal is to improve quality of life for both the patient and the family.   We discussed her current illness and what it means in the larger context of her on-going co-morbidities.  Natural disease trajectory and expectations at EOL were discussed.  Family continues to want to focus on her comfort. They feel she appears comfortable. We discussed her long periods of apnea. Husband would like [facility] to [facility] - we discussed risk  of patient passing away during transport. Family is willing to accept this risk and prefer that she be transferred.   Husband also raises concerns about patient not receiving IV fluids - we discussed comfort care and concerns of giving IV fluids to patients at the very end of life. He expresses gratitude for information and understanding after conversation.   Discussed plan of care with hospice liaison, social work, Therapist, sports, and Dr. Eileen Stanford.   Questions and concerns were addressed. The family was encouraged to call with questions or concerns.   Primary Decision Maker NEXT OF KIN - son - Mikhia Dusek  SUMMARY OF RECOMMENDATIONS   - [facility] per family's request/wish that patient pass away there and not in the hospital - they accept risk of death during transport - Patient appears comfortable - will d/c morphine in setting of kidney failure - add 0.5 mg dilaudid q2hr PRN - continue antiepileptics - Continue PRN ativan and haldol  Code Status/Advance Care Planning:  DNR  Palliative Prophylaxis:   Aspiration, Delirium Protocol, Frequent Pain Assessment and Turn Reposition  Additional Recommendations (Limitations, Scope, Preferences):  Full Comfort Care  Psycho-social/Spiritual:   Desire for further Chaplaincy support:no  Additional Recommendations: Education on Hospice  Prognosis:   Hours - Days  Discharge Planning: Hospice facility      Primary Diagnoses: Present on Admission: . Status epilepticus ([place]) . Hypernatremia . Cerebral infarction [facility])   I have reviewed the medical record, interviewed the patient and family, and examined the patient. The following aspects are pertinent.  Past Medical History:  Diagnosis Date  . Anemia   . Arthritis of ankle or  foot, degenerative 04/30/2016  . Atrial fibrillation ([place]) 09/2016   with left atrial appendage, failed xarelto due to GIB, unable to have watchman due to nickel allergy and Coumadin  never started  . AVM (arteriovenous malformation) of colon with hemorrhage 2018   requiring transfusion --- last one was March 2018 - HgB on 10/22/2016 was 7.3 and on 10/23/2016 was 8.7  . Bacteriuria with pyuria 08/14/2015  . Benign hypertension 1995  . Cancer of kidney ([place])   . Cholelithiasis 2008  . Chronic conjunctivitis 07/04/2012  . Chronic kidney disease   . Clostridium difficile colitis 2017  . Cognitive impairment   . Colon polyp 2005   and 11/16/2016  . COPD (chronic obstructive pulmonary disease) ([place])    and currenlty smokes--- refuses to use HS home O2  . Coronary artery disease 2002  . CVA (cerebral vascular accident) ([place])   . Cystitis    INTESTINAL CHRONIC  . DDD (degenerative disc disease), cervical   . Depression   . Diabetes mellitus ([place]) 1999   HGBA1C on 09/09/2016 was 6.8  . Disease of thyroid gland   . History of kidney cancer    LEFT  . History of pancreatitis   . History of transfusion 10/2016  . Hyperlipidemia   . Hypertension   . Hypothyroidism   . Iron deficiency anemia 09/27/2016  . Left homonymous hemianopsia 07/04/2012  . Lisfranc dislocation, right, initial encounter 08/14/2017  . Memory impairment 09/09/2016  . Nuclear sclerosis of left eye 12/09/2016   added automatically from request for surgery [phone]  . OSA (obstructive sleep apnea)    o2 3L [place] PRN  . Pancreatitis 2008  . Posterior vitreous detachment 07/04/2012  . Postsurgical hypothyroidism    S/P partial thyroidectomy 1978, later went for additional parital thyroidectomy  . Renal cell adenocarcinoma, left ([place]) 2005   Sunol  . Retinal drusen 07/04/2012  . Shortness of breath   . Sinus tarsitis of left foot 09/15/2016  . Snores 2005   does not use CPAP regularly  . Stroke [facility]) 2011   LLE sensory, 2012 x2 and 2013 x1; [place]  . TIA (transient ischemic attack) 10/21/2016  . Type 2 diabetes mellitus ([place])   . Type 2 diabetes mellitus with stage 3 chronic kidney disease, with  long-term current use of insulin ([place])    on 10/23/2016 Scr was 1.89, GFR 26 and K+ 4.2   Social History   Socioeconomic History  . Marital status: Married    Spouse name: Not on file  . Number of children: Not on file  . Years of education: Not on file  . Highest education level: Not on file  Occupational History  . Not on file  Social Needs  . Financial resource strain: Not on file  . Food insecurity:    Worry: Not on file    Inability: Not on file  . Transportation needs:    Medical: Not on file    Non-medical: Not on file  Tobacco Use  . Smoking status: Former Smoker    Packs/day: 1.00    Years: 50.00    Pack years: 50.00    Types: Cigarettes    Last attempt to quit: 09/01/2017    Years since quitting: 0.8  . Smokeless tobacco: Never Used  Substance and Sexual Activity  . Alcohol use: No    Frequency: Never  . Drug use: Not Currently  . Sexual activity: Never  Lifestyle  . Physical activity:    Days per week:  Not on file    Minutes per session: Not on file  . Stress: Not on file  Relationships  . Social connections:    Talks on phone: Not on file    Gets together: Not on file    Attends religious service: Not on file    Active member of club or organization: Not on file    Attends meetings of clubs or organizations: Not on file    Relationship status: Not on file  Other Topics Concern  . Not on file  Social History Narrative  . Not on file   Family History  Problem Relation Age of Onset  . Hyperlipidemia Mother   . Lung cancer Mother   . Aneurysm Mother        abdominal aortic  . Anuerysm Father   . Heart disease Father   . Hyperlipidemia Father   . Stroke Father   . Tremor Father        familial essential  . Hypertension Sister   . Cancer Daughter        Peritoneal carcinomatosis, surviving  . Ovarian cancer Daughter   . Aneurysm Brother        abdominal aortic  . Anesthesia problems Neg Hx   . Arthritis Neg Hx   . Asthma Neg Hx   .  Cerebral palsy Neg Hx   . Clotting disorder Neg Hx   . Deep vein thrombosis Neg Hx   . Club foot Neg Hx   . Collagen disease Neg Hx   . Diabetes Neg Hx   . Gait disorder Neg Hx   . Gout Neg Hx   . Hip dysplasia Neg Hx   . Hip fracture Neg Hx   . Hypermobility Neg Hx   . Osteoporosis Neg Hx   . Pulmonary embolism Neg Hx   . Scoliosis Neg Hx   . Spina bifida Neg Hx   . Thyroid disease Neg Hx   . Vasculitis Neg Hx   . Rheumatologic disease Neg Hx    Scheduled Meds: . phenytoin (DILANTIN) IV  100 mg Intravenous Q8H   Continuous Infusions: PRN Meds:.acetaminophen **OR** acetaminophen, antiseptic oral rinse, glycopyrrolate **OR** glycopyrrolate **OR** glycopyrrolate, haloperidol **OR** haloperidol **OR** haloperidol lactate, HYDROmorphone (DILAUDID) injection, LORazepam **OR** LORazepam **OR** LORazepam, ondansetron **OR** ondansetron (ZOFRAN) IV, polyvinyl alcohol Allergies  Allergen Reactions  . Atorvastatin Itching and Other (See Comments)    Myopathy also  Myopathy  . Carvedilol Itching  . Cinoxacin Itching and Other (See Comments)    Hypotension also hypotension  . Etodolac Other (See Comments), Itching and Hives  . Metoclopramide Itching  . Moxifloxacin Itching  . Rivaroxaban Other (See Comments)    GI bleed GI bleed GI bleed   . Rosuvastatin Itching and Other (See Comments)    Myalgias  . Yellow Dye Hives  . Nitrofurantoin Other (See Comments)    Yeast infection Yeast infection Yeast infection   . Paroxetine Hcl Other (See Comments) and Itching    Unknown reaction Unknown reaction  . Rosuvastatin Calcium Other (See Comments)    Myalgias  . Fluoxetine Other (See Comments)    Pancreatitis  . Levetiracetam Other (See Comments)    Unknown   . Other     Nickle  . Sulfa Antibiotics Hives and Itching  . Yellow Dyes (Non-Tartrazine) Other (See Comments)    unspecified  . Aspirin-Dipyridamole Er Itching  . Ezetimibe-Simvastatin Itching  . Pitavastatin Itching    Review of Systems  Unable to perform ROS: Acuity  of condition    Physical Exam  Constitutional: No distress.  HENT:  Head: Normocephalic and atraumatic.  Cardiovascular: Normal rate and regular rhythm.  Pulmonary/Chest: Apnea noted.  Abdominal: Soft.  Musculoskeletal:  Cool extremities  Neurological: She is unresponsive.  Skin: She is not diaphoretic.    Vital Signs: BP 138/70 (BP Location: Left Arm)   Pulse 89   Temp 97.8 F (36.6 C) (Axillary)   Resp 20   Wt 58 kg   SpO2 96%   BMI 21.95 kg/m  Pain Scale: 0-10   Pain Score: 0-No pain   SpO2: SpO2: 96 % O2 Device:SpO2: 96 % O2 Flow Rate: .   IO: Intake/output summary:   Intake/Output Summary (Last 24 hours) at 07/13/2018 1233 Last data filed at 07/13/2018 8299 Gross per 24 hour  Intake 73 ml  Output 400 ml  Net -327 ml    LBM: Last BM Date: (unknown) Baseline Weight: Weight: 58 kg Most recent weight: Weight: 58 kg     Palliative Assessment/Data: PPS 10%   Flowsheet Rows     Most Recent Value  Intake Tab  Unit at Time of Referral  ER  Palliative Care Primary Diagnosis  Neurology  Date Notified  07/12/18  Palliative Care Type  Return patient Palliative Care  Reason for referral  End of Life Care Assistance  Date of Admission  07/12/18  # of days IP prior to Palliative referral  0  Clinical Assessment  Psychosocial & Spiritual Assessment  Palliative Care Outcomes      Time Total: 70 minutes Greater than 50%  of this time was spent counseling and coordinating care related to the above assessment and plan.  Juel Burrow, DNP, AGNP-C Palliative Medicine Team [phone] Pager: [phone]

## 2018-08-09 DEATH — deceased

## 2019-07-25 IMAGING — CT CT HEAD W/O CM
4 series · 16 of 47 positions shown, 18 images · non-contrast
Comparison: CT head and CTA head 09/01/2017

CLINICAL DATA: Stroke.  Post tPA

EXAM:
CT HEAD WITHOUT CONTRAST
TECHNIQUE: Contiguous axial images were obtained from the base of the skull
through the vertex without intravenous contrast.

[Series 3: head wo · axial · 0.45mm/px · z∈[-166,-46]mm · 7 of 33 slices shown, 9 images]
[im 5/33  brain]
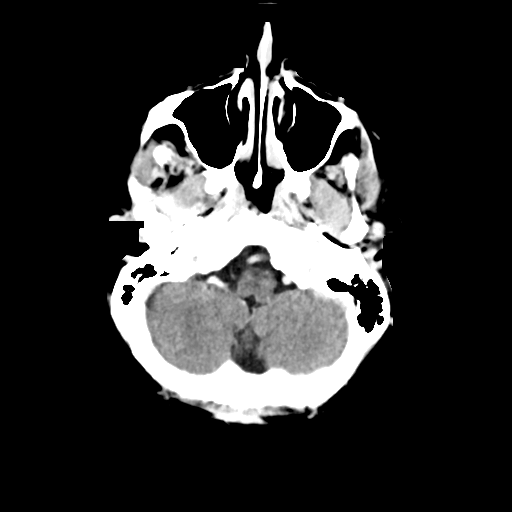
[im 5/33  bone]
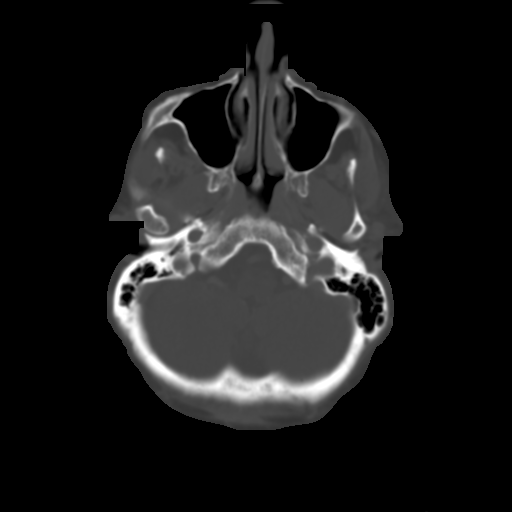
[im 9/33  brain]
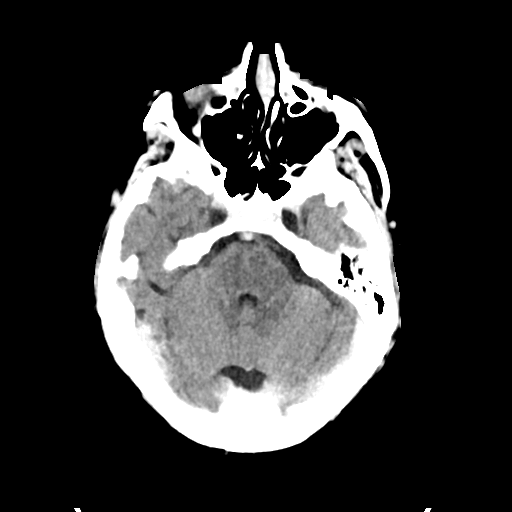
[im 13/33  brain]
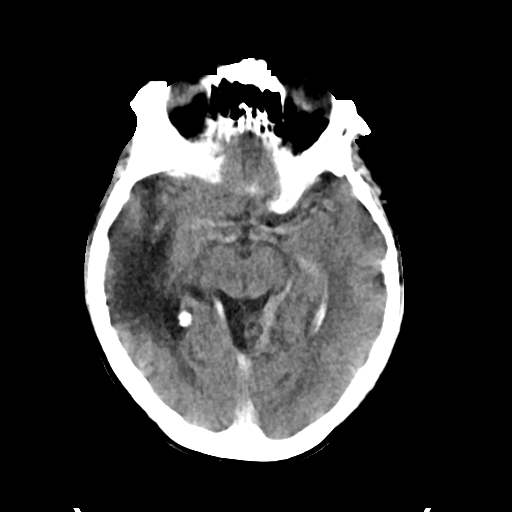
[im 17/33  brain]
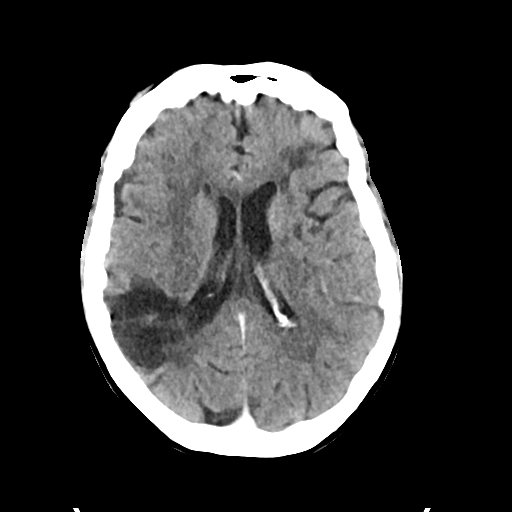
[im 21/33  brain]
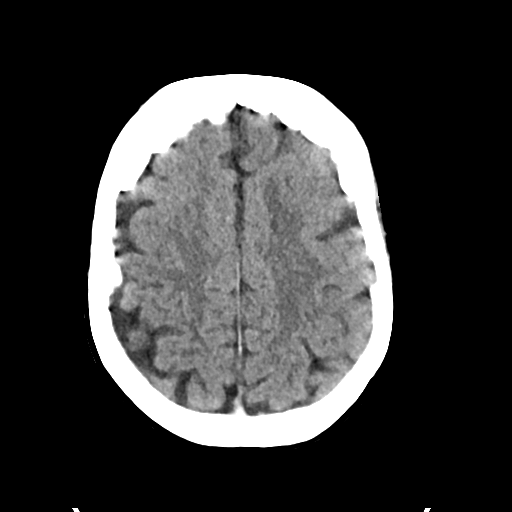
[im 21/33  bone]
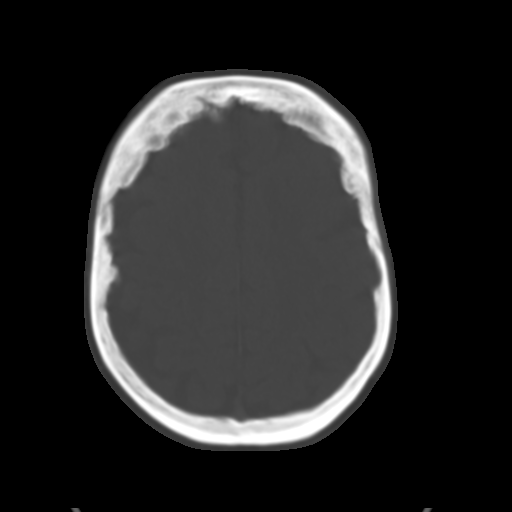
[im 25/33  brain]
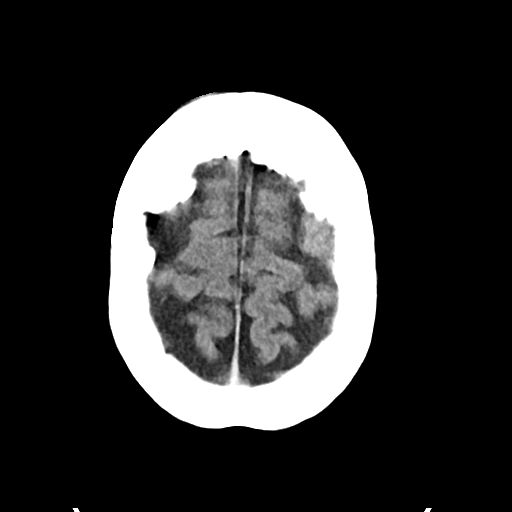
[im 29/33  brain]
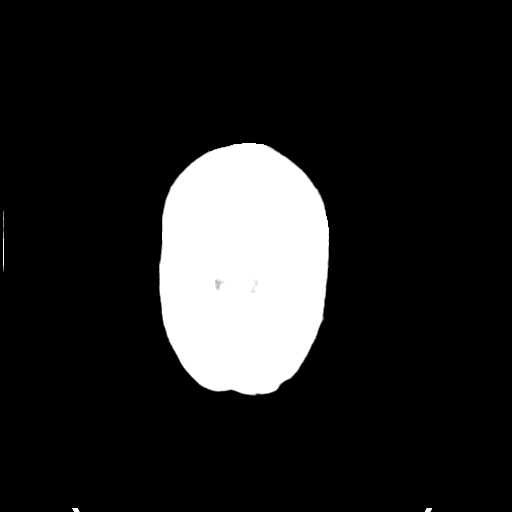

[Series 4: head bone · axial · 0.45mm/px · z∈[-170,-138]mm · 3 of 81 slices shown]
[im 9/81  bone]
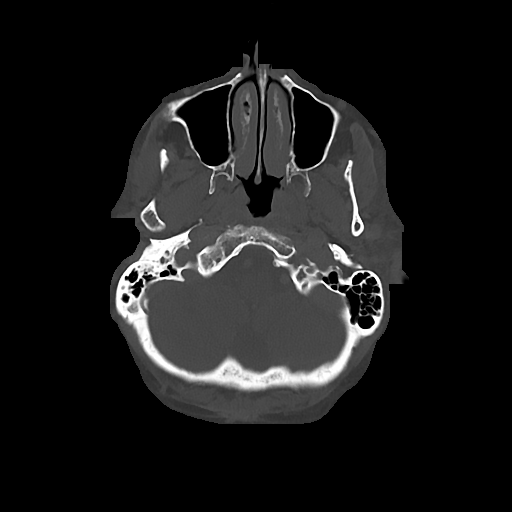
[im 17/81  bone]
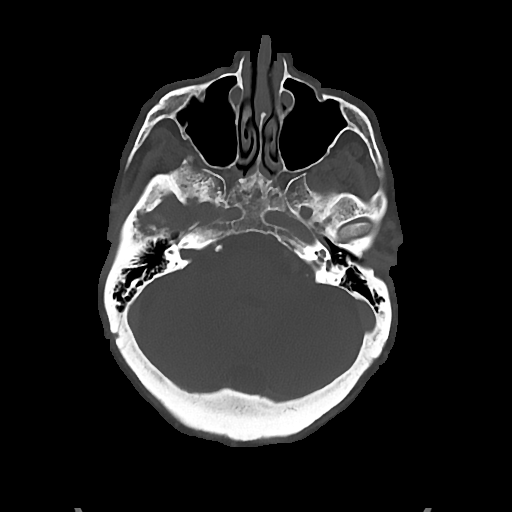
[im 25/81  bone]
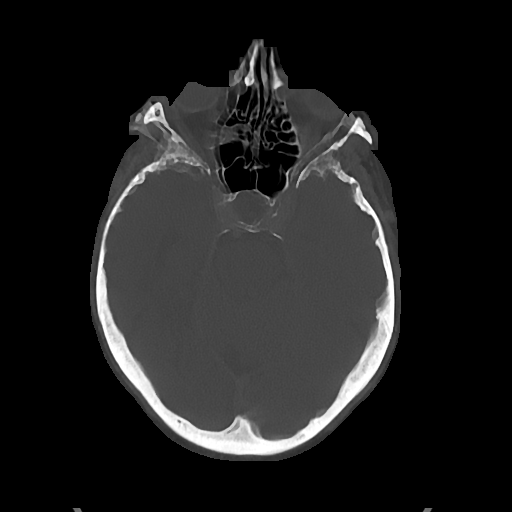

[Series 5: cor soft · coronal · 0.31mm/px · 3 of 67 slices shown]
[im 23/67  brain]
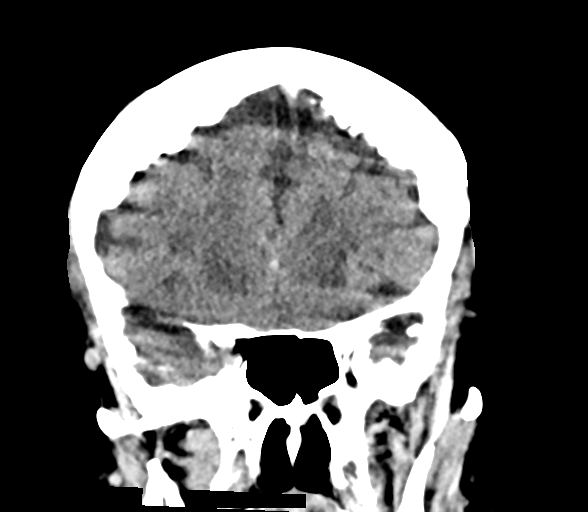
[im 30/67  brain]
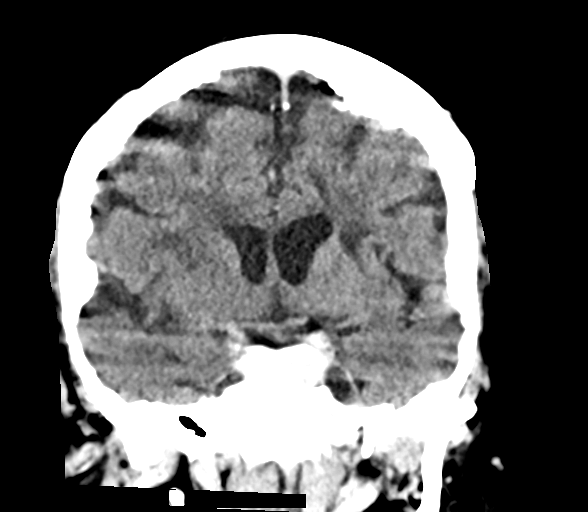
[im 37/67  brain]
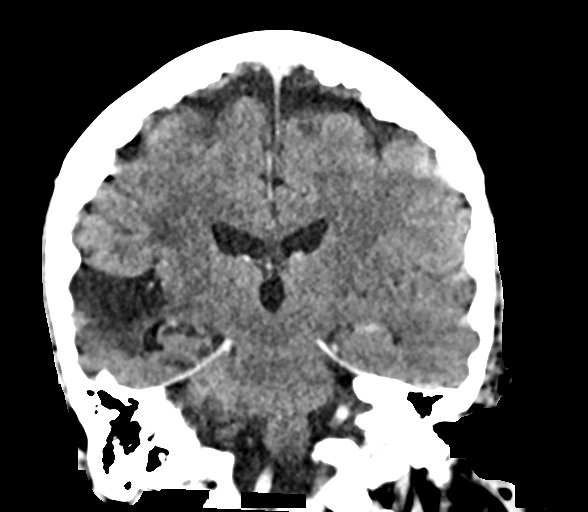

[Series 6: sag soft · sagittal · 0.31mm/px · 3 of 59 slices shown]
[im 20/59  brain]
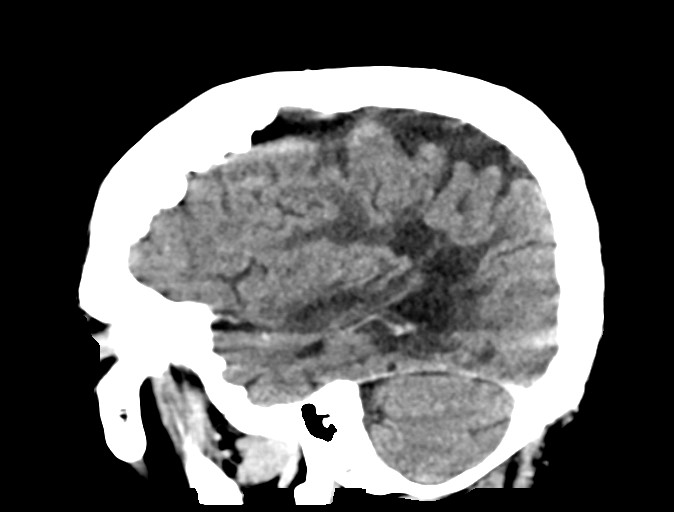
[im 30/59  brain]
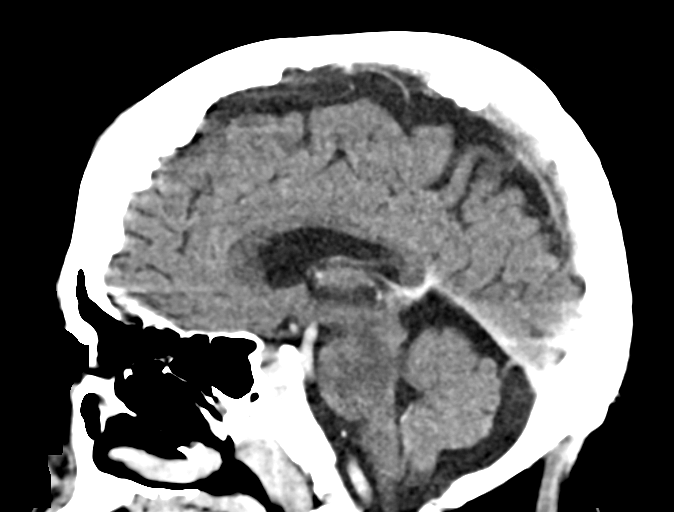
[im 39/59  brain]
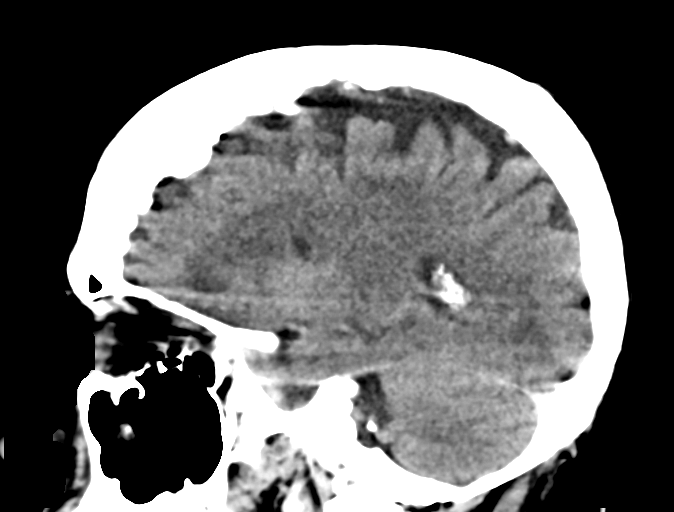

[16 of 47 positions shown; findings below may reference images not displayed]

FINDINGS: Brain: Negative for acute hemorrhage

Chronic infarct right temporoparietal lobe unchanged. Chronic
infarct left inferior frontal lobe unchanged. Chronic infarct in the
left internal capsule unchanged. Ventricle size normal. No midline
shift

CT perfusion abnormality in the left parietal lobe without definite
CT findings of acute infarct at this time.

Vascular: Intravenous contrast is noted from earlier CTA.

Skull: Negative

Sinuses/Orbits: Negative

Other: None
IMPRESSION: No acute hemorrhage following tPA. No change from head CT earlier
today. Chronic ischemic changes bilaterally.
